# Patient Record
Sex: Male | Born: 1940
Health system: Southern US, Community
[De-identification: ages and names within clinical notes are randomized; demographics above are authoritative.]

## PROBLEM LIST (undated history)

## (undated) DIAGNOSIS — R233 Spontaneous ecchymoses: Secondary | ICD-10-CM

## (undated) DIAGNOSIS — J189 Pneumonia, unspecified organism: Secondary | ICD-10-CM

## (undated) DIAGNOSIS — I700 Atherosclerosis of aorta: Secondary | ICD-10-CM

## (undated) DIAGNOSIS — E782 Mixed hyperlipidemia: Secondary | ICD-10-CM

## (undated) DIAGNOSIS — K56609 Unspecified intestinal obstruction, unspecified as to partial versus complete obstruction: Secondary | ICD-10-CM

## (undated) DIAGNOSIS — K219 Gastro-esophageal reflux disease without esophagitis: Secondary | ICD-10-CM

## (undated) DIAGNOSIS — M1990 Unspecified osteoarthritis, unspecified site: Secondary | ICD-10-CM

## (undated) DIAGNOSIS — F5101 Primary insomnia: Secondary | ICD-10-CM

## (undated) DIAGNOSIS — R0902 Hypoxemia: Secondary | ICD-10-CM

## (undated) DIAGNOSIS — J449 Chronic obstructive pulmonary disease, unspecified: Secondary | ICD-10-CM

## (undated) DIAGNOSIS — N400 Enlarged prostate without lower urinary tract symptoms: Secondary | ICD-10-CM

## (undated) DIAGNOSIS — G4730 Sleep apnea, unspecified: Secondary | ICD-10-CM

## (undated) DIAGNOSIS — I10 Essential (primary) hypertension: Secondary | ICD-10-CM

## (undated) HISTORY — DX: Primary insomnia: F51.01

## (undated) HISTORY — DX: Unspecified intestinal obstruction, unspecified as to partial versus complete obstruction: K56.609

## (undated) HISTORY — DX: Sleep apnea, unspecified: G47.30

## (undated) HISTORY — DX: Gastro-esophageal reflux disease without esophagitis: K21.9

## (undated) HISTORY — PX: ABDOMINAL AORTIC ANEURYSM REPAIR: SUR1152

## (undated) HISTORY — DX: Spontaneous ecchymoses: R23.3

## (undated) HISTORY — PX: PROSTATECTOMY: SHX69

## (undated) HISTORY — DX: Chronic obstructive pulmonary disease, unspecified: J44.9

## (undated) HISTORY — DX: Atherosclerosis of aorta: I70.0

## (undated) HISTORY — DX: Unspecified osteoarthritis, unspecified site: M19.90

## (undated) HISTORY — DX: Benign prostatic hyperplasia without lower urinary tract symptoms: N40.0

## (undated) HISTORY — DX: Hypoxemia: R09.02

## (undated) HISTORY — PX: BACK SURGERY: SHX140

## (undated) HISTORY — DX: Essential (primary) hypertension: I10

## (undated) HISTORY — DX: Pneumonia, unspecified organism: J18.9

## (undated) HISTORY — DX: Mixed hyperlipidemia: E78.2

---

## 1982-12-23 HISTORY — PX: BACK SURGERY: SHX140

## 2003-03-11 ENCOUNTER — Encounter: Payer: Self-pay | Admitting: Neurosurgery

## 2003-03-15 ENCOUNTER — Inpatient Hospital Stay (HOSPITAL_COMMUNITY): Admission: RE | Admit: 2003-03-15 | Discharge: 2003-03-17 | Payer: Self-pay | Admitting: Neurosurgery

## 2003-03-15 ENCOUNTER — Encounter: Payer: Self-pay | Admitting: Neurosurgery

## 2012-06-14 DIAGNOSIS — N201 Calculus of ureter: Secondary | ICD-10-CM | POA: Diagnosis not present

## 2012-06-14 DIAGNOSIS — R109 Unspecified abdominal pain: Secondary | ICD-10-CM | POA: Diagnosis not present

## 2012-06-14 DIAGNOSIS — R112 Nausea with vomiting, unspecified: Secondary | ICD-10-CM | POA: Diagnosis not present

## 2013-12-23 HISTORY — PX: ROTATOR CUFF REPAIR: SHX139

## 2014-01-03 DIAGNOSIS — R6889 Other general symptoms and signs: Secondary | ICD-10-CM | POA: Diagnosis not present

## 2014-01-03 DIAGNOSIS — T8131XA Disruption of external operation (surgical) wound, not elsewhere classified, initial encounter: Secondary | ICD-10-CM | POA: Diagnosis not present

## 2015-02-03 DIAGNOSIS — J449 Chronic obstructive pulmonary disease, unspecified: Secondary | ICD-10-CM | POA: Diagnosis not present

## 2015-02-03 DIAGNOSIS — C4492 Squamous cell carcinoma of skin, unspecified: Secondary | ICD-10-CM | POA: Diagnosis not present

## 2015-02-03 DIAGNOSIS — G5792 Unspecified mononeuropathy of left lower limb: Secondary | ICD-10-CM | POA: Diagnosis not present

## 2015-02-11 DIAGNOSIS — C4402 Squamous cell carcinoma of skin of lip: Secondary | ICD-10-CM | POA: Diagnosis not present

## 2015-02-11 DIAGNOSIS — L570 Actinic keratosis: Secondary | ICD-10-CM | POA: Diagnosis not present

## 2015-02-20 DIAGNOSIS — J449 Chronic obstructive pulmonary disease, unspecified: Secondary | ICD-10-CM | POA: Diagnosis not present

## 2015-02-20 DIAGNOSIS — C4492 Squamous cell carcinoma of skin, unspecified: Secondary | ICD-10-CM | POA: Diagnosis not present

## 2015-02-20 DIAGNOSIS — G5792 Unspecified mononeuropathy of left lower limb: Secondary | ICD-10-CM | POA: Diagnosis not present

## 2015-02-25 DIAGNOSIS — J209 Acute bronchitis, unspecified: Secondary | ICD-10-CM | POA: Diagnosis not present

## 2015-02-25 DIAGNOSIS — J449 Chronic obstructive pulmonary disease, unspecified: Secondary | ICD-10-CM | POA: Diagnosis not present

## 2015-02-28 DIAGNOSIS — R0602 Shortness of breath: Secondary | ICD-10-CM | POA: Diagnosis not present

## 2015-02-28 DIAGNOSIS — R042 Hemoptysis: Secondary | ICD-10-CM | POA: Diagnosis not present

## 2015-02-28 DIAGNOSIS — R079 Chest pain, unspecified: Secondary | ICD-10-CM | POA: Diagnosis not present

## 2015-02-28 DIAGNOSIS — R05 Cough: Secondary | ICD-10-CM | POA: Diagnosis not present

## 2015-02-28 DIAGNOSIS — R9431 Abnormal electrocardiogram [ECG] [EKG]: Secondary | ICD-10-CM | POA: Diagnosis not present

## 2015-03-01 DIAGNOSIS — J441 Chronic obstructive pulmonary disease with (acute) exacerbation: Secondary | ICD-10-CM | POA: Diagnosis not present

## 2015-03-01 DIAGNOSIS — K219 Gastro-esophageal reflux disease without esophagitis: Secondary | ICD-10-CM | POA: Diagnosis not present

## 2015-03-01 DIAGNOSIS — F17211 Nicotine dependence, cigarettes, in remission: Secondary | ICD-10-CM | POA: Diagnosis not present

## 2015-03-15 ENCOUNTER — Ambulatory Visit (INDEPENDENT_AMBULATORY_CARE_PROVIDER_SITE_OTHER): Payer: BLUE CROSS/BLUE SHIELD | Admitting: Internal Medicine

## 2015-03-15 ENCOUNTER — Encounter: Payer: Self-pay | Admitting: Internal Medicine

## 2015-03-15 VITALS — BP 132/80 | HR 70 | Ht 69.0 in | Wt 210.6 lb

## 2015-03-15 DIAGNOSIS — I10 Essential (primary) hypertension: Secondary | ICD-10-CM

## 2015-03-15 DIAGNOSIS — J449 Chronic obstructive pulmonary disease, unspecified: Secondary | ICD-10-CM | POA: Diagnosis not present

## 2015-03-15 DIAGNOSIS — J9611 Chronic respiratory failure with hypoxia: Secondary | ICD-10-CM | POA: Insufficient documentation

## 2015-03-15 MED ORDER — VALSARTAN 80 MG PO TABS: 80.0000 mg | ORAL_TABLET | Freq: Every day | ORAL | Status: DC

## 2015-03-15 MED ORDER — OMEPRAZOLE 20 MG PO CPDR: 20.0000 mg | DELAYED_RELEASE_CAPSULE | Freq: Two times a day (BID) | ORAL | Status: DC

## 2015-03-15 NOTE — Patient Instructions (Addendum)
Stop lisinopril and start valsartan 80 mg daily in its place  Increase omeprazole to 20 mg Take 30- 60 min before your first and last meals of the day   GERD (REFLUX)  is an extremely common cause of respiratory symptoms just like yours , many times with no obvious heartburn at all.    It can be treated with medication, but also with lifestyle changes including avoidance of late meals, excessive alcohol, smoking cessation, and avoid fatty foods, chocolate, peppermint, colas, red wine, and acidic juices such as orange juice.  NO MINT OR MENTHOL PRODUCTS SO NO COUGH DROPS  USE SUGARLESS CANDY INSTEAD (Jolley ranchers or Stover's or Life Savers) or even ice chips will also do - the key is to swallow to prevent all throat clearing. NO OIL BASED VITAMINS - use powdered substitutes.  Only use your albuterol as a rescue medication to be used if you can't catch your breath by resting or doing a relaxed purse lip breathing pattern.  - The less you use it, the better it will work when you need it. - Ok to use up to 2 puffs  every 4 hours if you must but call for immediate appointment if use goes up over your usual need - Don't leave home without it !!  (think of it like the spare tire for your car)     Please schedule a follow up office visit in 6 weeks, call sooner if needed with pfts on return

## 2015-03-15 NOTE — Progress Notes (Signed)
Subjective:     Patient ID: William Clay, male   DOB: 1941/07/05,    MRN: [id number]  HPI  60 yowm quit smoking 2009  Dx copd 2010 referrred 03/15/2015 by Dr Dirk Dress for sob   03/15/2015 1st [facility] office visit/ William Clay   Chief Complaint  Patient presents with  . Pulmonary Consult    Referred by Dr. [name] for eval of COPD. Pt states dxed with COPD in 2010. He c/o cough, SOB and hoarsenss. He states that his SOB "depends on the weather".    indolent onset of progressive doe after quit smoking and gradually has needed more and more duoneb with less and less benefit. Cough is dry and day > noct. Hot or cold weather makes it worse.  Presently doe x 50 ft/ assoc with maybe 25 lb wt gain since quit smoking   No better on breo, symbiocort or spiriva and couldn't afford them anyway    No obvious day to day or daytime variabilty or assoc  cp or chest tightness, subjective wheeze overt sinus or hb symptoms. No unusual exp hx or h/o childhood pna/ asthma or knowledge of premature birth.  Sleeping ok without nocturnal  or early am exacerbation  of respiratory  c/o's or need for noct saba. Also denies any obvious fluctuation of symptoms with weather or environmental changes or other aggravating or alleviating factors except as outlined above   Current Medications, Allergies, Complete Past Medical History, Past Surgical History, Family History, and Social History were reviewed in [facility] record.  ROS  The following are not active complaints unless bolded sore throat, dysphagia, dental problems, itching, sneezing,  nasal congestion or excess/ purulent secretions, ear ache,   fever, chills, sweats, unintended wt loss, pleuritic or exertional cp, hemoptysis,  orthopnea pnd or leg swelling, presyncope, palpitations, heartburn, abdominal pain, anorexia, nausea, vomiting, diarrhea  or change in bowel or urinary habits, change in stools or urine, dysuria,hematuria,   rash, arthralgias, visual complaints, headache, numbness weakness or ataxia or problems with walking or coordination,  change in mood/affect or memory.         Review of Systems     Objective:   Physical Exam   Extremely hoarse amb wm nad  Wt Readings from Last 3 Encounters:  03/15/15 210 lb 9.6 oz (95.528 kg)    Vital signs reviewed  HEENT: nl dentition, turbinates, and orophanx. Nl external ear canals without cough reflex   NECK :  without JVD/Nodes/TM/ nl carotid upstrokes bilaterally   LUNGS: no acc muscle use, clear to A and P bilaterally without cough on insp or exp maneuvers   CV:  RRR  no s3 or murmur or increase in P2, no edema   ABD:  soft and nontender with end insp hoover's. No bruits or organomegaly, bowel sounds nl  MS:  warm without deformities, calf tenderness, cyanosis or clubbing  SKIN: warm and dry without lesions    NEURO:  alert, approp, no deficits   No xrays on file in [facility] but has had CT at [facility]     Assessment:

## 2015-03-17 ENCOUNTER — Encounter: Payer: Self-pay | Admitting: Internal Medicine

## 2015-03-17 DIAGNOSIS — I10 Essential (primary) hypertension: Secondary | ICD-10-CM | POA: Insufficient documentation

## 2015-03-17 NOTE — Assessment & Plan Note (Signed)
>>  ASSESSMENT AND PLAN FOR CHRONIC OBSTRUCTIVE PULMONARY DISEASE (HCC) WRITTEN ON 03/17/2015  4:30 PM BY WERT, MICHAEL B, MD   When respiratory symptoms begin or become refractory well after a patient reports complete smoking cessation,  Especially when this wasn't the case while they were smoking, a red flag is raised based on the work of Dr Primitivo Gauze which states:  if you quit smoking when your best day FEV1 is still well preserved it is highly unlikely you will progress to severe disease.  That is to say, once the smoking stops,  the symptoms should not suddenly erupt or markedly worsen.  If so, the differential diagnosis should include  obesity/deconditioning,  LPR/Reflux/Aspiration syndromes,  occult CHF, or  especially side effect of medications commonly used in this population, especially ACEi  He has gained about 25 lb and could have LPR but I'm most concerned based on exam re ACEi (see hbp)  rec max gerd rx and off acei then return for full pfts in 6 weeks  See instructions for specific recommendations which were reviewed directly with the patient who was given a copy with highlighter outlining the key components.

## 2015-03-17 NOTE — Assessment & Plan Note (Signed)
  When respiratory symptoms begin or become refractory well after a patient reports complete smoking cessation,  Especially when this wasn't the case while they were smoking, a red flag is raised based on the work of Dr Kris Mouton which states:  if you quit smoking when your best day FEV1 is still well preserved it is highly unlikely you will progress to severe disease.  That is to say, once the smoking stops,  the symptoms should not suddenly erupt or markedly worsen.  If so, the differential diagnosis should include  obesity/deconditioning,  LPR/Reflux/Aspiration syndromes,  occult CHF, or  especially side effect of medications commonly used in this population, especially ACEi  He has gained about 25 lb and could have LPR but I'm most concerned based on exam re ACEi (see hbp)  rec max gerd rx and off acei then return for full pfts in 6 weeks  See instructions for specific recommendations which were reviewed directly with the patient who was given a copy with highlighter outlining the key components.

## 2015-03-17 NOTE — Assessment & Plan Note (Addendum)
ACE inhibitors are problematic in  pts with airway complaints because  even experienced pulmonologists can't always distinguish ace effects from copd/asthma.  By themselves they don't actually cause a problem, much like oxygen can't by itself start a fire, but they certainly serve as a powerful catalyst or enhancer for any "fire"  or inflammatory process in the upper airway, be it caused by an ET  tube or more commonly reflux (especially in the obese or pts with known GERD or who are on biphoshonates).    In the era of ARB near equivalency until we have a better handle on the reversibility of the airway problem will rec trial off acei > it's the only way to tell whether it's contributing to any of his symptoms  Try valsartan 80 mg daily

## 2015-03-20 DIAGNOSIS — R1013 Epigastric pain: Secondary | ICD-10-CM | POA: Diagnosis not present

## 2015-03-20 DIAGNOSIS — R1310 Dysphagia, unspecified: Secondary | ICD-10-CM | POA: Diagnosis not present

## 2015-03-20 DIAGNOSIS — K219 Gastro-esophageal reflux disease without esophagitis: Secondary | ICD-10-CM | POA: Diagnosis not present

## 2015-03-24 DIAGNOSIS — I10 Essential (primary) hypertension: Secondary | ICD-10-CM | POA: Diagnosis not present

## 2015-03-24 DIAGNOSIS — R1310 Dysphagia, unspecified: Secondary | ICD-10-CM | POA: Diagnosis not present

## 2015-03-24 DIAGNOSIS — K2270 Barrett's esophagus without dysplasia: Secondary | ICD-10-CM | POA: Diagnosis not present

## 2015-03-24 DIAGNOSIS — K449 Diaphragmatic hernia without obstruction or gangrene: Secondary | ICD-10-CM | POA: Diagnosis not present

## 2015-03-24 DIAGNOSIS — Z8719 Personal history of other diseases of the digestive system: Secondary | ICD-10-CM | POA: Diagnosis not present

## 2015-03-24 DIAGNOSIS — K317 Polyp of stomach and duodenum: Secondary | ICD-10-CM | POA: Diagnosis not present

## 2015-03-24 DIAGNOSIS — K219 Gastro-esophageal reflux disease without esophagitis: Secondary | ICD-10-CM | POA: Diagnosis not present

## 2015-03-24 DIAGNOSIS — K209 Esophagitis, unspecified: Secondary | ICD-10-CM | POA: Diagnosis not present

## 2015-04-19 DIAGNOSIS — I10 Essential (primary) hypertension: Secondary | ICD-10-CM | POA: Diagnosis not present

## 2015-04-19 DIAGNOSIS — E780 Pure hypercholesterolemia: Secondary | ICD-10-CM | POA: Diagnosis not present

## 2015-04-20 DIAGNOSIS — I10 Essential (primary) hypertension: Secondary | ICD-10-CM | POA: Diagnosis not present

## 2015-04-20 DIAGNOSIS — G5792 Unspecified mononeuropathy of left lower limb: Secondary | ICD-10-CM | POA: Diagnosis not present

## 2015-04-20 DIAGNOSIS — G4733 Obstructive sleep apnea (adult) (pediatric): Secondary | ICD-10-CM | POA: Diagnosis not present

## 2015-04-20 DIAGNOSIS — J449 Chronic obstructive pulmonary disease, unspecified: Secondary | ICD-10-CM | POA: Diagnosis not present

## 2015-04-20 DIAGNOSIS — E780 Pure hypercholesterolemia: Secondary | ICD-10-CM | POA: Diagnosis not present

## 2015-04-26 ENCOUNTER — Ambulatory Visit (INDEPENDENT_AMBULATORY_CARE_PROVIDER_SITE_OTHER): Payer: Medicare Other | Admitting: Internal Medicine

## 2015-04-26 ENCOUNTER — Encounter: Payer: Self-pay | Admitting: Internal Medicine

## 2015-04-26 VITALS — BP 124/80 | HR 83 | Ht 69.0 in | Wt 215.0 lb

## 2015-04-26 DIAGNOSIS — I10 Essential (primary) hypertension: Secondary | ICD-10-CM

## 2015-04-26 DIAGNOSIS — J449 Chronic obstructive pulmonary disease, unspecified: Secondary | ICD-10-CM | POA: Diagnosis not present

## 2015-04-26 LAB — PULMONARY FUNCTION TEST
DL/VA % PRED: 47 %
DL/VA: 2.14 ml/min/mmHg/L
DLCO UNC % PRED: 44 %
DLCO UNC: 13.68 ml/min/mmHg
FEF 25-75 PRE: 1.23 L/s
FEF 25-75 Post: 2.42 L/sec
FEF2575-%CHANGE-POST: 97 %
FEF2575-%PRED-POST: 105 %
FEF2575-%Pred-Pre: 53 %
FEV1-%CHANGE-POST: 14 %
FEV1-%PRED-POST: 86 %
FEV1-%PRED-PRE: 75 %
FEV1-POST: 2.65 L
FEV1-Pre: 2.32 L
FEV1FVC-%Change-Post: 1 %
FEV1FVC-%PRED-PRE: 93 %
FEV6-%Change-Post: 14 %
FEV6-%Pred-Post: 96 %
FEV6-%Pred-Pre: 84 %
FEV6-POST: 3.81 L
FEV6-Pre: 3.34 L
FEV6FVC-%Change-Post: 1 %
FEV6FVC-%PRED-PRE: 104 %
FEV6FVC-%Pred-Post: 105 %
FVC-%Change-Post: 12 %
FVC-%Pred-Post: 91 %
FVC-%Pred-Pre: 81 %
FVC-Post: 3.83 L
FVC-Pre: 3.41 L
POST FEV1/FVC RATIO: 69 %
PRE FEV6/FVC RATIO: 98 %
Post FEV6/FVC ratio: 99 %
Pre FEV1/FVC ratio: 68 %
RV % pred: 124 %
RV: 3.03 L
TLC % PRED: 104 %
TLC: 7.1 L

## 2015-04-26 NOTE — Assessment & Plan Note (Signed)
-   trial off acei  03/15/2015 >  - PFTs  FEV1 2.65 ( 86%) ratio 69 p 15 % better from saba , dlco 44 corrcts to 93 and erv 56    I had an extended discussion with the patient reviewing all relevant studies completed to date and  lasting 15 to 20 minutes of a 25 minute visit on the following ongoing concerns:  As I explained to this patient in detail:  although there may be copd present, it is very mild and may not be clinically relevant:   it does not appear to be limiting activity tolerance any more than a set of worn tires limits someone from driving a car  around a parking lot.  A new set of Michelins might look good but would have no perceived impact on the performance of the car and would not be worth the cost.  That is to say:    I don't recommend aggressive pulmonary rx at this point unless limiting symptoms arise or acute exacerbations become as issue, neither of which is the case now.  I asked the patient to contact this office at any time in the future should either of these problems arise.  Ok to use saba in this setting but if needs more than bid needs to return to consider laba or lama or laba/lama though not needed at present

## 2015-04-26 NOTE — Patient Instructions (Signed)
Ok to use albuterol up to twice daily but if needing more please return   You have the mildest form of copd there is = GOLD I and there is no need for any form of stronger medications at this point    If you are satisfied with your treatment plan,  let your doctor know and he/she can either refill your medications or you can return here when your prescription runs out.     If in any way you are not 100% satisfied,  please tell us.  If 100% better, tell your friends!  Pulmonary follow up is as needed

## 2015-04-26 NOTE — Progress Notes (Signed)
PFT performed today. 

## 2015-04-26 NOTE — Assessment & Plan Note (Signed)
>>  ASSESSMENT AND PLAN FOR CHRONIC OBSTRUCTIVE PULMONARY DISEASE (HCC) WRITTEN ON 04/26/2015  5:00 PM BY WERT, MICHAEL B, MD  - trial off acei  03/15/2015 >  - PFTs  FEV1 2.65 ( 86%) ratio 69 p 15 % better from saba , dlco 44 corrcts to 93 and erv 56    I had an extended discussion with the patient reviewing all relevant studies completed to date and  lasting 15 to 20 minutes of a 25 minute visit on the following ongoing concerns:  As I explained to this patient in detail:  although there may be copd present, it is very mild and may not be clinically relevant:   it does not appear to be limiting activity tolerance any more than a set of worn tires limits someone from driving a car  around a parking lot.  A new set of Michelins might look good but would have no perceived impact on the performance of the car and would not be worth the cost.  That is to say:    I don't recommend aggressive pulmonary rx at this point unless limiting symptoms arise or acute exacerbations become as issue, neither of which is the case now.  I asked the patient to contact this office at any time in the future should either of these problems arise.  Ok to use saba in this setting but if needs more than bid needs to return to consider laba or lama or laba/lama though not needed at present

## 2015-04-26 NOTE — Progress Notes (Signed)
Subjective:    Patient ID: William Clay, male   DOB: Oct 25, 1941     MRN: [id number]    Brief patient profile:  45 yowm quit smoking 2009  Dx copd 2010 referrred 03/15/2015 by Dr Dirk Dress for sob with GOLD I criteria 04/26/2015    History of Present Illness  03/15/2015 1st [facility] office visit/ [name]   Chief Complaint  Patient presents with  . Pulmonary Consult    Referred by Dr. [name] for eval of COPD. Pt states dxed with COPD in 2010. He c/o cough, SOB and hoarsenss. He states that his SOB "depends on the weather".    indolent onset of progressive doe after quit smoking and gradually has needed more and more duoneb with less and less benefit. Cough is dry and day > noct. Hot or cold weather makes it worse.  Presently doe x 50 ft/ assoc with maybe 25 lb wt gain since quit smoking  No better on breo, symbiocort or spiriva and couldn't afford them anyway  rec Stop lisinopril and start valsartan 80 mg daily in its place Increase omeprazole to 20 mg Take 30- 60 min before your first and last meals of the day  GERD  Diet  Only use your albuterol as rescue   04/26/2015 f/u ov/Clova Morlock re: GOLD I  COPD  Chief Complaint  Patient presents with  . Follow-up    PFT done today. Cough, hoarseness and SOB are unchanged since the last visit.     Not limited by breathing from desired activities   Wakes up each am and uses neb saba  to get his motor running but then nothing needed p that all day   No obvious day to day or daytime variabilty or assoc  cp or chest tightness, subjective wheeze overt sinus or hb symptoms. No unusual exp hx or h/o childhood pna/ asthma or knowledge of premature birth.  Sleeping ok without nocturnal  or early am exacerbation  of respiratory  c/o's or need for noct saba. Also denies any obvious fluctuation of symptoms with weather or environmental changes or other aggravating or alleviating factors except as outlined above   Current Medications, Allergies,  Complete Past Medical History, Past Surgical History, Family History, and Social History were reviewed in [employer] record.  ROS  The following are not active complaints unless bolded sore throat, dysphagia, dental problems, itching, sneezing,  nasal congestion or excess/ purulent secretions, ear ache,   fever, chills, sweats, unintended wt loss, pleuritic or exertional cp, hemoptysis,  orthopnea pnd or leg swelling, presyncope, palpitations, heartburn, abdominal pain, anorexia, nausea, vomiting, diarrhea  or change in bowel or urinary habits, change in stools or urine, dysuria,hematuria,  rash, arthralgias, visual complaints, headache, numbness weakness or ataxia or problems with walking or coordination,  change in mood/affect or memory.               Objective:   Physical Exam   Moderately  hoarse amb wm nad (much improved)     Wt Readings from Last 3 Encounters:  04/26/15 215 lb (97.523 kg)  03/15/15 210 lb 9.6 oz (95.528 kg)    Vital signs reviewed   HEENT: nl dentition, turbinates, and orophanx. Nl external ear canals without cough reflex   NECK :  without JVD/Nodes/TM/ nl carotid upstrokes bilaterally   LUNGS: no acc muscle use, clear to A and P bilaterally without cough on insp or exp maneuvers   CV:  RRR  no s3  or murmur or increase in P2, no edema   ABD:  soft and nontender with end insp hoover's. No bruits or organomegaly, bowel sounds nl  MS:  warm without deformities, calf tenderness, cyanosis or clubbing  SKIN: warm and dry without lesions    NEURO:  alert, approp, no deficits   No xrays on file in [facility] but has had CT at [facility]     Assessment:

## 2015-04-26 NOTE — Assessment & Plan Note (Signed)
Try off acei 03/15/2015 due to pseudowheeze > resolved  Adequate control on present rx, reviewed > no change in rx needed  > would avoid acei here.   All f/u per primary care

## 2015-05-17 DIAGNOSIS — R1310 Dysphagia, unspecified: Secondary | ICD-10-CM | POA: Diagnosis not present

## 2015-05-17 DIAGNOSIS — K219 Gastro-esophageal reflux disease without esophagitis: Secondary | ICD-10-CM | POA: Diagnosis not present

## 2015-05-23 DIAGNOSIS — K219 Gastro-esophageal reflux disease without esophagitis: Secondary | ICD-10-CM | POA: Diagnosis not present

## 2015-05-23 DIAGNOSIS — R1310 Dysphagia, unspecified: Secondary | ICD-10-CM | POA: Diagnosis not present

## 2015-05-30 DIAGNOSIS — R1310 Dysphagia, unspecified: Secondary | ICD-10-CM | POA: Diagnosis not present

## 2015-06-25 DIAGNOSIS — K8020 Calculus of gallbladder without cholecystitis without obstruction: Secondary | ICD-10-CM | POA: Diagnosis not present

## 2015-06-25 DIAGNOSIS — R109 Unspecified abdominal pain: Secondary | ICD-10-CM | POA: Diagnosis not present

## 2015-07-04 DIAGNOSIS — L853 Xerosis cutis: Secondary | ICD-10-CM | POA: Diagnosis not present

## 2015-07-04 DIAGNOSIS — R233 Spontaneous ecchymoses: Secondary | ICD-10-CM | POA: Diagnosis not present

## 2015-07-26 DIAGNOSIS — N400 Enlarged prostate without lower urinary tract symptoms: Secondary | ICD-10-CM | POA: Diagnosis not present

## 2015-07-26 DIAGNOSIS — R7301 Impaired fasting glucose: Secondary | ICD-10-CM | POA: Diagnosis not present

## 2015-07-26 DIAGNOSIS — E780 Pure hypercholesterolemia: Secondary | ICD-10-CM | POA: Diagnosis not present

## 2015-07-26 DIAGNOSIS — I10 Essential (primary) hypertension: Secondary | ICD-10-CM | POA: Diagnosis not present

## 2015-07-27 DIAGNOSIS — R7309 Other abnormal glucose: Secondary | ICD-10-CM | POA: Diagnosis not present

## 2015-07-27 DIAGNOSIS — I10 Essential (primary) hypertension: Secondary | ICD-10-CM | POA: Diagnosis not present

## 2015-07-27 DIAGNOSIS — G5792 Unspecified mononeuropathy of left lower limb: Secondary | ICD-10-CM | POA: Diagnosis not present

## 2015-07-27 DIAGNOSIS — G4733 Obstructive sleep apnea (adult) (pediatric): Secondary | ICD-10-CM | POA: Diagnosis not present

## 2015-07-27 DIAGNOSIS — N400 Enlarged prostate without lower urinary tract symptoms: Secondary | ICD-10-CM | POA: Diagnosis not present

## 2015-07-27 DIAGNOSIS — M6530 Trigger finger, unspecified finger: Secondary | ICD-10-CM | POA: Diagnosis not present

## 2015-07-27 DIAGNOSIS — E780 Pure hypercholesterolemia: Secondary | ICD-10-CM | POA: Diagnosis not present

## 2015-07-27 DIAGNOSIS — J449 Chronic obstructive pulmonary disease, unspecified: Secondary | ICD-10-CM | POA: Diagnosis not present

## 2015-08-30 DIAGNOSIS — K760 Fatty (change of) liver, not elsewhere classified: Secondary | ICD-10-CM | POA: Diagnosis not present

## 2015-08-30 DIAGNOSIS — J439 Emphysema, unspecified: Secondary | ICD-10-CM | POA: Diagnosis not present

## 2015-08-30 DIAGNOSIS — J9601 Acute respiratory failure with hypoxia: Secondary | ICD-10-CM | POA: Diagnosis not present

## 2015-08-30 DIAGNOSIS — R509 Fever, unspecified: Secondary | ICD-10-CM | POA: Diagnosis not present

## 2015-08-30 DIAGNOSIS — R0602 Shortness of breath: Secondary | ICD-10-CM | POA: Diagnosis not present

## 2015-08-30 DIAGNOSIS — F441 Dissociative fugue: Secondary | ICD-10-CM | POA: Diagnosis present

## 2015-08-30 DIAGNOSIS — Z7982 Long term (current) use of aspirin: Secondary | ICD-10-CM | POA: Diagnosis not present

## 2015-08-30 DIAGNOSIS — Z888 Allergy status to other drugs, medicaments and biological substances status: Secondary | ICD-10-CM | POA: Diagnosis not present

## 2015-08-30 DIAGNOSIS — Z880 Allergy status to penicillin: Secondary | ICD-10-CM | POA: Diagnosis not present

## 2015-08-30 DIAGNOSIS — R0989 Other specified symptoms and signs involving the circulatory and respiratory systems: Secondary | ICD-10-CM | POA: Diagnosis not present

## 2015-08-30 DIAGNOSIS — B9620 Unspecified Escherichia coli [E. coli] as the cause of diseases classified elsewhere: Secondary | ICD-10-CM | POA: Diagnosis present

## 2015-08-30 DIAGNOSIS — K219 Gastro-esophageal reflux disease without esophagitis: Secondary | ICD-10-CM | POA: Diagnosis not present

## 2015-08-30 DIAGNOSIS — J441 Chronic obstructive pulmonary disease with (acute) exacerbation: Secondary | ICD-10-CM | POA: Diagnosis not present

## 2015-08-30 DIAGNOSIS — Z79899 Other long term (current) drug therapy: Secondary | ICD-10-CM | POA: Diagnosis not present

## 2015-08-30 DIAGNOSIS — I10 Essential (primary) hypertension: Secondary | ICD-10-CM | POA: Diagnosis not present

## 2015-08-30 DIAGNOSIS — J159 Unspecified bacterial pneumonia: Secondary | ICD-10-CM | POA: Diagnosis not present

## 2015-08-30 DIAGNOSIS — Z8701 Personal history of pneumonia (recurrent): Secondary | ICD-10-CM | POA: Diagnosis not present

## 2015-08-30 DIAGNOSIS — Z23 Encounter for immunization: Secondary | ICD-10-CM | POA: Diagnosis not present

## 2015-08-30 DIAGNOSIS — A419 Sepsis, unspecified organism: Secondary | ICD-10-CM | POA: Diagnosis not present

## 2015-08-30 DIAGNOSIS — Z85820 Personal history of malignant melanoma of skin: Secondary | ICD-10-CM | POA: Diagnosis not present

## 2015-08-30 DIAGNOSIS — G92 Toxic encephalopathy: Secondary | ICD-10-CM | POA: Diagnosis not present

## 2015-08-30 DIAGNOSIS — E780 Pure hypercholesterolemia: Secondary | ICD-10-CM | POA: Diagnosis not present

## 2015-08-30 DIAGNOSIS — Z87891 Personal history of nicotine dependence: Secondary | ICD-10-CM | POA: Diagnosis not present

## 2015-08-30 DIAGNOSIS — R319 Hematuria, unspecified: Secondary | ICD-10-CM | POA: Diagnosis not present

## 2015-08-30 DIAGNOSIS — N3001 Acute cystitis with hematuria: Secondary | ICD-10-CM | POA: Diagnosis not present

## 2015-08-30 DIAGNOSIS — M1990 Unspecified osteoarthritis, unspecified site: Secondary | ICD-10-CM | POA: Diagnosis not present

## 2015-08-30 DIAGNOSIS — R410 Disorientation, unspecified: Secondary | ICD-10-CM | POA: Diagnosis not present

## 2015-08-30 DIAGNOSIS — R6520 Severe sepsis without septic shock: Secondary | ICD-10-CM | POA: Diagnosis not present

## 2015-09-07 DIAGNOSIS — J449 Chronic obstructive pulmonary disease, unspecified: Secondary | ICD-10-CM | POA: Diagnosis not present

## 2015-09-07 DIAGNOSIS — N390 Urinary tract infection, site not specified: Secondary | ICD-10-CM | POA: Diagnosis not present

## 2015-09-07 DIAGNOSIS — A419 Sepsis, unspecified organism: Secondary | ICD-10-CM | POA: Diagnosis not present

## 2015-09-07 DIAGNOSIS — D7289 Other specified disorders of white blood cells: Secondary | ICD-10-CM | POA: Diagnosis not present

## 2015-09-07 DIAGNOSIS — J9601 Acute respiratory failure with hypoxia: Secondary | ICD-10-CM | POA: Diagnosis not present

## 2015-09-07 DIAGNOSIS — D72829 Elevated white blood cell count, unspecified: Secondary | ICD-10-CM | POA: Diagnosis not present

## 2015-09-07 DIAGNOSIS — G92 Toxic encephalopathy: Secondary | ICD-10-CM | POA: Diagnosis not present

## 2015-09-07 DIAGNOSIS — I10 Essential (primary) hypertension: Secondary | ICD-10-CM | POA: Diagnosis not present

## 2015-09-21 DIAGNOSIS — M791 Myalgia: Secondary | ICD-10-CM | POA: Diagnosis not present

## 2015-09-21 DIAGNOSIS — D72829 Elevated white blood cell count, unspecified: Secondary | ICD-10-CM | POA: Diagnosis not present

## 2015-10-09 DIAGNOSIS — Z6832 Body mass index (BMI) 32.0-32.9, adult: Secondary | ICD-10-CM | POA: Diagnosis not present

## 2015-10-09 DIAGNOSIS — Z0000 Encounter for general adult medical examination without abnormal findings: Secondary | ICD-10-CM | POA: Diagnosis not present

## 2015-10-09 DIAGNOSIS — E6609 Other obesity due to excess calories: Secondary | ICD-10-CM | POA: Diagnosis not present

## 2015-10-09 DIAGNOSIS — Z1211 Encounter for screening for malignant neoplasm of colon: Secondary | ICD-10-CM | POA: Diagnosis not present

## 2015-10-12 DIAGNOSIS — Z1211 Encounter for screening for malignant neoplasm of colon: Secondary | ICD-10-CM | POA: Diagnosis not present

## 2015-10-16 DIAGNOSIS — F17211 Nicotine dependence, cigarettes, in remission: Secondary | ICD-10-CM | POA: Diagnosis not present

## 2015-10-16 DIAGNOSIS — J441 Chronic obstructive pulmonary disease with (acute) exacerbation: Secondary | ICD-10-CM | POA: Diagnosis not present

## 2015-10-19 DIAGNOSIS — M7550 Bursitis of unspecified shoulder: Secondary | ICD-10-CM | POA: Diagnosis not present

## 2015-10-19 DIAGNOSIS — M25512 Pain in left shoulder: Secondary | ICD-10-CM | POA: Diagnosis not present

## 2015-10-19 DIAGNOSIS — S4992XS Unspecified injury of left shoulder and upper arm, sequela: Secondary | ICD-10-CM | POA: Diagnosis not present

## 2015-10-24 DIAGNOSIS — M25512 Pain in left shoulder: Secondary | ICD-10-CM | POA: Diagnosis not present

## 2015-10-24 DIAGNOSIS — M7522 Bicipital tendinitis, left shoulder: Secondary | ICD-10-CM | POA: Diagnosis not present

## 2015-10-24 DIAGNOSIS — S46012A Strain of muscle(s) and tendon(s) of the rotator cuff of left shoulder, initial encounter: Secondary | ICD-10-CM | POA: Diagnosis not present

## 2015-10-24 DIAGNOSIS — M7580 Other shoulder lesions, unspecified shoulder: Secondary | ICD-10-CM | POA: Diagnosis not present

## 2015-10-26 DIAGNOSIS — S4992XS Unspecified injury of left shoulder and upper arm, sequela: Secondary | ICD-10-CM | POA: Diagnosis not present

## 2015-10-26 DIAGNOSIS — S46012A Strain of muscle(s) and tendon(s) of the rotator cuff of left shoulder, initial encounter: Secondary | ICD-10-CM | POA: Diagnosis not present

## 2015-10-31 DIAGNOSIS — I10 Essential (primary) hypertension: Secondary | ICD-10-CM | POA: Diagnosis not present

## 2015-10-31 DIAGNOSIS — R7301 Impaired fasting glucose: Secondary | ICD-10-CM | POA: Diagnosis not present

## 2015-10-31 DIAGNOSIS — E782 Mixed hyperlipidemia: Secondary | ICD-10-CM | POA: Diagnosis not present

## 2015-11-01 DIAGNOSIS — M47814 Spondylosis without myelopathy or radiculopathy, thoracic region: Secondary | ICD-10-CM | POA: Diagnosis not present

## 2015-11-01 DIAGNOSIS — J208 Acute bronchitis due to other specified organisms: Secondary | ICD-10-CM | POA: Diagnosis not present

## 2015-11-01 DIAGNOSIS — R7309 Other abnormal glucose: Secondary | ICD-10-CM | POA: Diagnosis not present

## 2015-11-01 DIAGNOSIS — J018 Other acute sinusitis: Secondary | ICD-10-CM | POA: Diagnosis not present

## 2015-11-01 DIAGNOSIS — I10 Essential (primary) hypertension: Secondary | ICD-10-CM | POA: Diagnosis not present

## 2015-11-01 DIAGNOSIS — D72828 Other elevated white blood cell count: Secondary | ICD-10-CM | POA: Diagnosis not present

## 2015-11-01 DIAGNOSIS — J410 Simple chronic bronchitis: Secondary | ICD-10-CM | POA: Diagnosis not present

## 2015-11-01 DIAGNOSIS — M5416 Radiculopathy, lumbar region: Secondary | ICD-10-CM | POA: Diagnosis not present

## 2015-11-01 DIAGNOSIS — M66812 Spontaneous rupture of other tendons, left shoulder: Secondary | ICD-10-CM | POA: Diagnosis not present

## 2015-11-01 DIAGNOSIS — E784 Other hyperlipidemia: Secondary | ICD-10-CM | POA: Diagnosis not present

## 2015-11-01 DIAGNOSIS — G4733 Obstructive sleep apnea (adult) (pediatric): Secondary | ICD-10-CM | POA: Diagnosis not present

## 2015-11-01 DIAGNOSIS — Z01810 Encounter for preprocedural cardiovascular examination: Secondary | ICD-10-CM | POA: Diagnosis not present

## 2015-11-01 DIAGNOSIS — Z9889 Other specified postprocedural states: Secondary | ICD-10-CM | POA: Diagnosis not present

## 2015-11-10 DIAGNOSIS — J441 Chronic obstructive pulmonary disease with (acute) exacerbation: Secondary | ICD-10-CM | POA: Diagnosis not present

## 2015-11-10 DIAGNOSIS — F17211 Nicotine dependence, cigarettes, in remission: Secondary | ICD-10-CM | POA: Diagnosis not present

## 2015-11-13 DIAGNOSIS — I10 Essential (primary) hypertension: Secondary | ICD-10-CM | POA: Diagnosis not present

## 2015-11-21 DIAGNOSIS — F17211 Nicotine dependence, cigarettes, in remission: Secondary | ICD-10-CM | POA: Diagnosis not present

## 2015-11-21 DIAGNOSIS — J441 Chronic obstructive pulmonary disease with (acute) exacerbation: Secondary | ICD-10-CM | POA: Diagnosis not present

## 2015-11-27 DIAGNOSIS — D72828 Other elevated white blood cell count: Secondary | ICD-10-CM | POA: Diagnosis not present

## 2015-11-28 DIAGNOSIS — R0602 Shortness of breath: Secondary | ICD-10-CM | POA: Diagnosis not present

## 2015-11-28 DIAGNOSIS — J441 Chronic obstructive pulmonary disease with (acute) exacerbation: Secondary | ICD-10-CM | POA: Diagnosis not present

## 2015-11-28 DIAGNOSIS — K8080 Other cholelithiasis without obstruction: Secondary | ICD-10-CM | POA: Diagnosis not present

## 2015-11-28 DIAGNOSIS — R918 Other nonspecific abnormal finding of lung field: Secondary | ICD-10-CM | POA: Diagnosis not present

## 2015-11-28 DIAGNOSIS — R0902 Hypoxemia: Secondary | ICD-10-CM | POA: Diagnosis not present

## 2015-11-28 DIAGNOSIS — F17211 Nicotine dependence, cigarettes, in remission: Secondary | ICD-10-CM | POA: Diagnosis not present

## 2015-11-28 DIAGNOSIS — R0789 Other chest pain: Secondary | ICD-10-CM | POA: Diagnosis not present

## 2015-11-30 ENCOUNTER — Ambulatory Visit (INDEPENDENT_AMBULATORY_CARE_PROVIDER_SITE_OTHER): Payer: Medicare Other | Admitting: Internal Medicine

## 2015-11-30 ENCOUNTER — Ambulatory Visit
Admission: RE | Admit: 2015-11-30 | Discharge: 2015-11-30 | Disposition: A | Discharge status: Home / Self Care | Payer: Medicare Other | Source: Ambulatory Visit | Attending: Internal Medicine | Admitting: Internal Medicine

## 2015-11-30 ENCOUNTER — Encounter: Payer: Self-pay | Admitting: Internal Medicine

## 2015-11-30 VITALS — BP 132/84 | HR 90 | Ht 69.0 in | Wt 213.4 lb

## 2015-11-30 DIAGNOSIS — R058 Other specified cough: Secondary | ICD-10-CM

## 2015-11-30 DIAGNOSIS — R05 Cough: Secondary | ICD-10-CM | POA: Diagnosis not present

## 2015-11-30 DIAGNOSIS — R0981 Nasal congestion: Secondary | ICD-10-CM | POA: Diagnosis not present

## 2015-11-30 DIAGNOSIS — J449 Chronic obstructive pulmonary disease, unspecified: Secondary | ICD-10-CM

## 2015-11-30 MED ORDER — TRAMADOL HCL 50 MG PO TABS: ORAL_TABLET | ORAL | Status: DC

## 2015-11-30 MED ORDER — METHYLPREDNISOLONE ACETATE 80 MG/ML IJ SUSP
120.0000 mg | Freq: Once | INTRAMUSCULAR | Status: AC
  Administered 2015-11-30: 120 mg via INTRAMUSCULAR

## 2015-11-30 NOTE — Progress Notes (Signed)
Quick Note:  Spoke with pt and notified of results per Dr. Wert. Pt verbalized understanding and denied any questions.  ______ 

## 2015-11-30 NOTE — Assessment & Plan Note (Signed)
The most common causes of chronic cough in immunocompetent adults include the following: upper airway cough syndrome (UACS), previously referred to as postnasal drip syndrome (PNDS), which is caused by variety of rhinosinus conditions; (2) asthma; (3) GERD; (4) chronic bronchitis from cigarette smoking or other inhaled environmental irritants; (5) nonasthmatic eosinophilic bronchitis; and (6) bronchiectasis.   These conditions, singly or in combination, have accounted for up to 94% of the causes of chronic cough in prospective studies.   Other conditions have constituted no >6% of the causes in prospective studies These have included bronchogenic carcinoma, chronic interstitial pneumonia, sarcoidosis, left ventricular failure, ACEI-induced cough, and aspiration from a condition associated with pharyngeal dysfunction.    Chronic cough is often simultaneously caused by more than one condition. A single cause has been found from 38 to 82% of the time, multiple causes from 18 to 62%. Multiply caused cough has been the result of three diseases up to 42% of the time.       Based on hx and exam, this is most likely:  Classic Upper airway cough syndrome, so named because it's frequently impossible to sort out how much is  CR/sinusitis with freq throat clearing (which can be related to primary GERD)   vs  causing  secondary (" extra esophageal")  GERD from wide swings in gastric pressure that occur with throat clearing, often  promoting self use of mint and menthol lozenges that reduce the lower esophageal sphincter tone and exacerbate the problem further in a cyclical fashion.   These are the same pts (now being labeled as having "irritable larynx syndrome" by some cough centers) who not infrequently have a history of having failed to tolerate ace inhibitors,  dry powder inhalers (both may turn out to be the case here  or biphosphonates or report having atypical reflux symptoms that don't respond to standard  doses of PPI , and are easily confused as having aecopd or asthma flares by even experienced allergists/ pulmonologists.   The first step is to maximize acid suppression and eliminate cyclical coughing then regroup if the cough persists.  I had an extended discussion with the patient reviewing all relevant studies completed to date and  lasting 25/ 40 min acute ov   1) Explained the natural history of uri and why it's necessary in patients at risk to treat GERD aggressively - at least  short term -   to reduce risk of evolving cyclical cough initially  triggered by epithelial injury and a heightened sensitivty to the effects of any upper airway irritants,  most importantly acid - related - then perpetuated by epithelial injury related to the cough itself as the upper airway collapses on itself.  That is, the more sensitive the epithelium becomes once it is damaged by the virus, the more the ensuing irritability> the more the cough, the more the secondary reflux (especially in those prone to reflux) the more the irritation of the sensitive mucosa and so on in a  Classic cyclical pattern.        3)  Each maintenance medication was reviewed in detail including most importantly the difference between maintenance and prns and under what circumstances the prns are to be triggered using an action plan format that is not reflected in the computer generated alphabetically organized AVS.    Please see instructions for details which were reviewed in writing and the patient given a copy highlighting the part that I personally wrote and discussed at today's ov.   See  instructions for specific recommendations which were reviewed directly with the patient who was given a copy with highlighter outlining the key components.

## 2015-11-30 NOTE — Assessment & Plan Note (Signed)
>>  ASSESSMENT AND PLAN FOR CHRONIC OBSTRUCTIVE PULMONARY DISEASE (HCC) WRITTEN ON 11/30/2015  6:36 PM BY Sherene Sires, MICHAEL B, MD  - trial off acei  03/15/2015 >  - PFTs  FEV1 2.65 ( 86%) ratio 69 p 15 % better from saba , dlco 44 corrcts to 93 and erv 56  - d/c breo/ incruse 11/30/2015 due to cough and just use saba prn  - 11/30/2015  extensive coaching HFA effectiveness =    75%   No better on dpi/ pred so favor uacs here (see sep a/p) and should be ok off dpi for now / f/u in 4 weeks to close the loop

## 2015-11-30 NOTE — Progress Notes (Signed)
Subjective:    Patient ID: William Clay, male   DOB: 03-Feb-1941     MRN: [id number]    Brief patient profile:  20 yowm quit smoking 2009  Dx copd 2010 referrred 03/15/2015 by Dr Dirk Dress for sob with GOLD I criteria 04/26/2015    History of Present Illness  03/15/2015 1st [facility] office visit/ Taite Baldassari   Chief Complaint  Patient presents with  . Pulmonary Consult    Referred by Dr. [name] for eval of COPD. Pt states dxed with COPD in 2010. He c/o cough, SOB and hoarsenss. He states that his SOB "depends on the weather".    indolent onset of progressive doe after quit smoking and gradually has needed more and more duoneb with less and less benefit. Cough is dry and day > noct. Hot or cold weather makes it worse.  Presently doe x 50 ft/ assoc with maybe 25 lb wt gain since quit smoking  No better on breo, symbiocort or spiriva and couldn't afford them anyway  rec Stop lisinopril and start valsartan 80 mg daily in its place Increase omeprazole to 20 mg Take 30- 60 min before your first and last meals of the day  GERD  Diet  Only use your albuterol as rescue   04/26/2015 f/u ov/Marylee Belzer re: GOLD I  COPD  Chief Complaint  Patient presents with  . Follow-up    PFT done today. Cough, hoarseness and SOB are unchanged since the last visit.   Not limited by breathing from desired activities   Wakes up each am and uses neb saba  to get his motor running but then nothing needed p that all day  rec Ok to use albuterol up to twice daily but if needing more please return  You have the mildest form of copd there is = GOLD I and there is no need for any form of stronger medications at this point (prn saba)     11/30/2015 acute extended ov/Sarah-Jane Nazario re: GOLD I copd  Chief Complaint  Patient presents with  . Acute Visit    Pt c/o increased cough, SOB and hoarsness since 10/15/15. He states his sats have been low with exertion, but he does not have supplemental o2. He occ produces some  yellow/green sputum.  He c/o CP off and on x 3 days- worse when he lies down.   acute cough 10/15/15 and never improved so  BREO /Incruse  multiple abx/ pred added and no better  > last dose of pred one day prior to OV   Most of his cough is non productive, assoc with harsh cough and sense of choking on "sinus drainage" but no other nasal symptoms   No obvious day to day or daytime variabilty or assoc  cp or chest tightness, subjective wheeze or overt  hb symptoms. No unusual exp hx or h/o childhood pna/ asthma or knowledge of premature birth.   Also denies any obvious fluctuation of symptoms with weather or environmental changes or other aggravating or alleviating factors except as outlined above   Current Medications, Allergies, Complete Past Medical History, Past Surgical History, Family History, and Social History were reviewed in [employer] record.  ROS  The following are not active complaints unless bolded sore throat, dysphagia, dental problems, itching, sneezing,  nasal congestion or excess/ purulent secretions, ear ache,   fever, chills, sweats, unintended wt loss, pleuritic or exertional cp, hemoptysis,  orthopnea pnd or leg swelling, presyncope, palpitations, heartburn, abdominal pain,  anorexia, nausea, vomiting, diarrhea  or change in bowel or urinary habits, change in stools or urine, dysuria,hematuria,  rash, arthralgias, visual complaints, headache, numbness weakness or ataxia or problems with walking or coordination,  change in mood/affect or memory.               Objective:   Physical Exam   Severely hoarse wm nad/ prominent pseudowheeze      Wt Readings from Last 3 Encounters:  11/30/15 213 lb 6.4 oz (96.798 kg)  04/26/15 215 lb (97.523 kg)  03/15/15 210 lb 9.6 oz (95.528 kg)    Vital signs reviewed    HEENT: nl dentition, turbinates, and orophanx. Nl external ear canals without cough reflex   NECK :  without JVD/Nodes/TM/ nl carotid  upstrokes bilaterally   LUNGS: no acc muscle use, clear to A and P bilaterally without cough on insp or exp maneuvers   CV:  RRR  no s3 or murmur or increase in P2, no edema   ABD:  soft and nontender with end insp hoover's. No bruits or organomegaly, bowel sounds nl  MS:  warm without deformities, calf tenderness, cyanosis or clubbing  SKIN: warm and dry without lesions    NEURO:  alert, approp, no deficits    I personally reviewed images and agree with radiology impression as follows:  CT Chest   11/28/15  Non specific GG changes c/w small airways dz            Assessment:

## 2015-11-30 NOTE — Assessment & Plan Note (Addendum)
-   trial off acei  03/15/2015 >  - PFTs  FEV1 2.65 ( 86%) ratio 69 p 15 % better from saba , dlco 44 corrcts to 93 and erv 56  - d/c breo/ incruse 11/30/2015 due to cough and just use saba prn  - 11/30/2015  extensive coaching HFA effectiveness =    75%   No better on dpi/ pred so favor uacs here (see sep a/p) and should be ok off dpi for now / f/u in 4 weeks to close the loop

## 2015-11-30 NOTE — Patient Instructions (Addendum)
depomedrol 120 mg  IM  Stop BREO and incruse   Try prilosec 40mg   Take 30-60 min before first meal of the day and Pepcid ac (famotidine) 20 mg one @  bedtime until cough is completely gone for at least a week without the need for cough suppression  GERD (REFLUX)  is an extremely common cause of respiratory symptoms just like yours , many times with no obvious heartburn at all.    It can be treated with medication, but also with lifestyle changes including elevation of the head of your bed (ideally with 6 inch  bed blocks),  Smoking cessation, avoidance of late meals, excessive alcohol, and avoid fatty foods, chocolate, peppermint, colas, red wine, and acidic juices such as orange juice.  NO MINT OR MENTHOL PRODUCTS SO NO COUGH DROPS  USE SUGARLESS CANDY INSTEAD (Jolley ranchers or Stover's or Life Savers) or even ice chips will also do - the key is to swallow to prevent all throat clearing. NO OIL BASED VITAMINS - use powdered substitutes.    Take delsym two tsp every 12 hours and supplement if needed with  tramadol 50 mg up to 1 every 4 hours to suppress the urge to cough. Swallowing water or using ice chips/non mint and menthol containing candies (such as lifesavers or sugarless jolly ranchers) are also effective.  You should rest your voice and avoid activities that you know make you cough.  Once you have eliminated the cough for 3 straight days try reducing the tramadol first,  then the delsym as tolerated.    Please schedule a follow up office visit in 4 weeks, sooner if needed  Late add consider flutter next

## 2015-12-14 DIAGNOSIS — D72829 Elevated white blood cell count, unspecified: Secondary | ICD-10-CM | POA: Diagnosis not present

## 2015-12-14 DIAGNOSIS — R6889 Other general symptoms and signs: Secondary | ICD-10-CM | POA: Diagnosis not present

## 2015-12-14 DIAGNOSIS — Z5181 Encounter for therapeutic drug level monitoring: Secondary | ICD-10-CM | POA: Diagnosis not present

## 2015-12-22 ENCOUNTER — Encounter: Payer: Self-pay | Admitting: Internal Medicine

## 2015-12-24 HISTORY — PX: CATARACT EXTRACTION: SUR2

## 2015-12-24 HISTORY — PX: ROTATOR CUFF REPAIR: SHX139

## 2015-12-31 DIAGNOSIS — M150 Primary generalized (osteo)arthritis: Secondary | ICD-10-CM | POA: Diagnosis present

## 2015-12-31 DIAGNOSIS — Z87891 Personal history of nicotine dependence: Secondary | ICD-10-CM | POA: Diagnosis not present

## 2015-12-31 DIAGNOSIS — E7800 Pure hypercholesterolemia, unspecified: Secondary | ICD-10-CM | POA: Diagnosis present

## 2015-12-31 DIAGNOSIS — J9600 Acute respiratory failure, unspecified whether with hypoxia or hypercapnia: Secondary | ICD-10-CM | POA: Diagnosis not present

## 2015-12-31 DIAGNOSIS — R05 Cough: Secondary | ICD-10-CM | POA: Diagnosis not present

## 2015-12-31 DIAGNOSIS — D72829 Elevated white blood cell count, unspecified: Secondary | ICD-10-CM | POA: Diagnosis not present

## 2015-12-31 DIAGNOSIS — J9690 Respiratory failure, unspecified, unspecified whether with hypoxia or hypercapnia: Secondary | ICD-10-CM | POA: Diagnosis not present

## 2015-12-31 DIAGNOSIS — J440 Chronic obstructive pulmonary disease with acute lower respiratory infection: Secondary | ICD-10-CM | POA: Diagnosis present

## 2015-12-31 DIAGNOSIS — Z79899 Other long term (current) drug therapy: Secondary | ICD-10-CM | POA: Diagnosis not present

## 2015-12-31 DIAGNOSIS — D72825 Bandemia: Secondary | ICD-10-CM | POA: Diagnosis not present

## 2015-12-31 DIAGNOSIS — Z85828 Personal history of other malignant neoplasm of skin: Secondary | ICD-10-CM | POA: Diagnosis not present

## 2015-12-31 DIAGNOSIS — M159 Polyosteoarthritis, unspecified: Secondary | ICD-10-CM | POA: Diagnosis not present

## 2015-12-31 DIAGNOSIS — M6281 Muscle weakness (generalized): Secondary | ICD-10-CM | POA: Diagnosis not present

## 2015-12-31 DIAGNOSIS — J189 Pneumonia, unspecified organism: Secondary | ICD-10-CM | POA: Diagnosis not present

## 2015-12-31 DIAGNOSIS — R0602 Shortness of breath: Secondary | ICD-10-CM | POA: Diagnosis not present

## 2015-12-31 DIAGNOSIS — I10 Essential (primary) hypertension: Secondary | ICD-10-CM | POA: Diagnosis not present

## 2015-12-31 DIAGNOSIS — Z8701 Personal history of pneumonia (recurrent): Secondary | ICD-10-CM | POA: Diagnosis not present

## 2015-12-31 DIAGNOSIS — Z888 Allergy status to other drugs, medicaments and biological substances status: Secondary | ICD-10-CM | POA: Diagnosis not present

## 2015-12-31 DIAGNOSIS — J9621 Acute and chronic respiratory failure with hypoxia: Secondary | ICD-10-CM | POA: Diagnosis not present

## 2015-12-31 DIAGNOSIS — E789 Disorder of lipoprotein metabolism, unspecified: Secondary | ICD-10-CM | POA: Diagnosis not present

## 2015-12-31 DIAGNOSIS — K219 Gastro-esophageal reflux disease without esophagitis: Secondary | ICD-10-CM | POA: Diagnosis not present

## 2015-12-31 DIAGNOSIS — R918 Other nonspecific abnormal finding of lung field: Secondary | ICD-10-CM | POA: Diagnosis not present

## 2015-12-31 DIAGNOSIS — E785 Hyperlipidemia, unspecified: Secondary | ICD-10-CM | POA: Diagnosis not present

## 2015-12-31 DIAGNOSIS — J181 Lobar pneumonia, unspecified organism: Secondary | ICD-10-CM | POA: Diagnosis not present

## 2015-12-31 DIAGNOSIS — R262 Difficulty in walking, not elsewhere classified: Secondary | ICD-10-CM | POA: Diagnosis not present

## 2015-12-31 DIAGNOSIS — J441 Chronic obstructive pulmonary disease with (acute) exacerbation: Secondary | ICD-10-CM | POA: Diagnosis not present

## 2015-12-31 DIAGNOSIS — Z881 Allergy status to other antibiotic agents status: Secondary | ICD-10-CM | POA: Diagnosis not present

## 2015-12-31 DIAGNOSIS — G4733 Obstructive sleep apnea (adult) (pediatric): Secondary | ICD-10-CM | POA: Diagnosis present

## 2015-12-31 DIAGNOSIS — Z452 Encounter for adjustment and management of vascular access device: Secondary | ICD-10-CM | POA: Diagnosis not present

## 2016-01-01 ENCOUNTER — Ambulatory Visit: Payer: Medicare Other | Admitting: Internal Medicine

## 2016-01-06 DIAGNOSIS — R262 Difficulty in walking, not elsewhere classified: Secondary | ICD-10-CM | POA: Diagnosis not present

## 2016-01-06 DIAGNOSIS — G4733 Obstructive sleep apnea (adult) (pediatric): Secondary | ICD-10-CM | POA: Diagnosis not present

## 2016-01-06 DIAGNOSIS — D72829 Elevated white blood cell count, unspecified: Secondary | ICD-10-CM | POA: Diagnosis not present

## 2016-01-06 DIAGNOSIS — J449 Chronic obstructive pulmonary disease, unspecified: Secondary | ICD-10-CM | POA: Diagnosis not present

## 2016-01-06 DIAGNOSIS — E789 Disorder of lipoprotein metabolism, unspecified: Secondary | ICD-10-CM | POA: Diagnosis not present

## 2016-01-06 DIAGNOSIS — R05 Cough: Secondary | ICD-10-CM | POA: Diagnosis not present

## 2016-01-06 DIAGNOSIS — E784 Other hyperlipidemia: Secondary | ICD-10-CM | POA: Diagnosis not present

## 2016-01-06 DIAGNOSIS — R490 Dysphonia: Secondary | ICD-10-CM | POA: Diagnosis not present

## 2016-01-06 DIAGNOSIS — I10 Essential (primary) hypertension: Secondary | ICD-10-CM | POA: Diagnosis not present

## 2016-01-06 DIAGNOSIS — J9690 Respiratory failure, unspecified, unspecified whether with hypoxia or hypercapnia: Secondary | ICD-10-CM | POA: Diagnosis not present

## 2016-01-06 DIAGNOSIS — E785 Hyperlipidemia, unspecified: Secondary | ICD-10-CM | POA: Diagnosis not present

## 2016-01-06 DIAGNOSIS — D72825 Bandemia: Secondary | ICD-10-CM | POA: Diagnosis not present

## 2016-01-06 DIAGNOSIS — M6281 Muscle weakness (generalized): Secondary | ICD-10-CM | POA: Diagnosis not present

## 2016-01-06 DIAGNOSIS — J189 Pneumonia, unspecified organism: Secondary | ICD-10-CM | POA: Diagnosis not present

## 2016-01-06 DIAGNOSIS — M159 Polyosteoarthritis, unspecified: Secondary | ICD-10-CM | POA: Diagnosis not present

## 2016-01-08 DIAGNOSIS — J189 Pneumonia, unspecified organism: Secondary | ICD-10-CM | POA: Diagnosis not present

## 2016-01-08 DIAGNOSIS — I10 Essential (primary) hypertension: Secondary | ICD-10-CM | POA: Diagnosis not present

## 2016-01-08 DIAGNOSIS — R490 Dysphonia: Secondary | ICD-10-CM | POA: Diagnosis not present

## 2016-01-08 DIAGNOSIS — J449 Chronic obstructive pulmonary disease, unspecified: Secondary | ICD-10-CM | POA: Diagnosis not present

## 2016-01-11 ENCOUNTER — Encounter: Payer: Self-pay | Admitting: Internal Medicine

## 2016-01-11 ENCOUNTER — Ambulatory Visit (INDEPENDENT_AMBULATORY_CARE_PROVIDER_SITE_OTHER)
Admission: RE | Admit: 2016-01-11 | Discharge: 2016-01-11 | Disposition: A | Discharge status: Home / Self Care | Payer: Medicare Other | Source: Ambulatory Visit | Attending: Internal Medicine | Admitting: Internal Medicine

## 2016-01-11 ENCOUNTER — Ambulatory Visit (INDEPENDENT_AMBULATORY_CARE_PROVIDER_SITE_OTHER): Payer: Medicare Other | Admitting: Internal Medicine

## 2016-01-11 VITALS — BP 104/60 | HR 82 | Ht 69.0 in | Wt 211.6 lb

## 2016-01-11 DIAGNOSIS — J449 Chronic obstructive pulmonary disease, unspecified: Secondary | ICD-10-CM

## 2016-01-11 DIAGNOSIS — R490 Dysphonia: Secondary | ICD-10-CM | POA: Diagnosis not present

## 2016-01-11 DIAGNOSIS — R05 Cough: Secondary | ICD-10-CM

## 2016-01-11 DIAGNOSIS — I10 Essential (primary) hypertension: Secondary | ICD-10-CM | POA: Diagnosis not present

## 2016-01-11 DIAGNOSIS — E784 Other hyperlipidemia: Secondary | ICD-10-CM | POA: Diagnosis not present

## 2016-01-11 DIAGNOSIS — J189 Pneumonia, unspecified organism: Secondary | ICD-10-CM | POA: Diagnosis not present

## 2016-01-11 DIAGNOSIS — R058 Other specified cough: Secondary | ICD-10-CM

## 2016-01-11 NOTE — Progress Notes (Signed)
Subjective:    Patient ID: William Clay, male   DOB: 05/16/1941     MRN: [id number]    Brief patient profile:  28 yowm quit smoking 2009  Dx copd 2010 referrred 03/15/2015 by Dr Dirk Dress for sob with GOLD I criteria 04/26/2015    History of Present Illness  03/15/2015 1st [facility] office visit/ William Clay   Chief Complaint  Patient presents with  . Pulmonary Consult    Referred by Dr. [name] for eval of COPD. Pt states dxed with COPD in 2010. He c/o cough, SOB and hoarsenss. He states that his SOB "depends on the weather".    indolent onset of progressive doe after quit smoking and gradually has needed more and more duoneb with less and less benefit. Cough is dry and day > noct. Hot or cold weather makes it worse.  Presently doe x 50 ft/ assoc with maybe 25 lb wt gain since quit smoking  No better on breo, symbiocort or spiriva and couldn't afford them anyway  rec Stop lisinopril and start valsartan 80 mg daily in its place Increase omeprazole to 20 mg Take 30- 60 min before your first and last meals of the day  GERD  Diet  Only use your albuterol as rescue   04/26/2015 f/u ov/Janis Cuffe re: GOLD I  COPD  Chief Complaint  Patient presents with  . Follow-up    PFT done today. Cough, hoarseness and SOB are unchanged since the last visit.   Not limited by breathing from desired activities   Wakes up each am and uses neb saba  to get his motor running but then nothing needed p that all day  rec Ok to use albuterol up to twice daily but if needing more please return  You have the mildest form of copd there is = GOLD I and there is no need for any form of stronger medications at this point (prn saba)     11/30/2015 acute extended ov/Cyara Devoto re: GOLD I copd  Chief Complaint  Patient presents with  . Acute Visit    Pt c/o increased cough, SOB and hoarsness since 10/15/15. He states his sats have been low with exertion, but he does not have supplemental o2. He occ produces some  yellow/green sputum.  He c/o CP off and on x 3 days- worse when he lies down.   acute cough 10/15/15 and never improved so  BREO /Incruse  multiple abx/ pred added and no better  > last dose of pred one day prior to OV   Most of his cough is non productive, assoc with harsh cough and sense of choking on "sinus drainage" but no other nasal symptoms rec depomedrol 120 mg  IM Stop BREO and incruse  Try prilosec 40mg   Take 30-60 min before first meal of the day and Pepcid ac (famotidine) 20 mg one @  bedtime until cough is completely gone for at least a week without the need for cough suppression GERD diet    01/11/2016  f/u ov/Aksh Swart re: GOLD I copd/ cc  cough since 10/15/15 woke up with it and never resolved  Chief Complaint  Patient presents with  . Follow-up    4 wk. f/u-[facility].1 wk. ago,pneumonia.Sob better with exertion now. Currently in [facility].4 days ago had coughing with tan and brown,no blood.Did not receive abx.until late,Cough better now clear,still has hoarseness but better.Occass. wheezing,mid-chest tightness. Runny nose-clear occass.No fever,sweats easily.PND, no sorethroat.  says had swallowing eval at [facility]  but not in d/c summary. Has f/u planned with ent / GI in [place]  No obvious day to day or daytime variabilty or assoc cp or chest tightness, subjective wheeze or overt  hb symptoms. No unusual exp hx or h/o childhood pna/ asthma or knowledge of premature birth.   Also denies any obvious fluctuation of symptoms with weather or environmental changes or other aggravating or alleviating factors except as outlined above   Current Medications, Allergies, Complete Past Medical History, Past Surgical History, Family History, and Social History were reviewed in [employer] record.  ROS  The following are not active complaints unless bolded sore throat, dysphagia, dental problems, itching, sneezing,  nasal congestion or excess/ purulent  secretions, ear ache,   fever, chills, sweats, unintended wt loss, pleuritic or exertional cp, hemoptysis,  orthopnea pnd or leg swelling, presyncope, palpitations, heartburn, abdominal pain, anorexia, nausea, vomiting, diarrhea  or change in bowel or urinary habits, change in stools or urine, dysuria,hematuria,  rash, arthralgias, visual complaints, headache, numbness weakness or ataxia or problems with walking or coordination,  change in mood/affect or memory.               Objective:   Physical Exam    hoarse wm nad / congested sounding upper airway coughing     Wt Readings from Last 3 Encounters:  01/11/16 211 lb 9.6 oz (95.981 kg)  11/30/15 213 lb 6.4 oz (96.798 kg)  04/26/15 215 lb (97.523 kg)    Vital signs reviewed    HEENT: edentulous/ nl  turbinates, and orophanx. Nl external ear canals without cough reflex   NECK :  without JVD/Nodes/TM/ nl carotid upstrokes bilaterally   LUNGS: no acc muscle use,  Min insp and exp rhonchi bilaterally    CV:  RRR  no s3 or murmur or increase in P2, no edema   ABD:  soft and nontender with end insp hoover's. No bruits or organomegaly, bowel sounds nl  MS:  warm without deformities, calf tenderness, cyanosis or clubbing  SKIN: warm and dry without lesions    NEURO:  alert, approp, no deficits       CXR PA and Lateral:   01/11/2016 :    I personally reviewed images and agree with radiology impression as follows:    The lungs are hyperinflated likely secondary to COPD. There is no focal parenchymal opacity. There is no pleural effusion or pneumothorax. The heart and mediastinal contours are unremarkable.  There is mild thoracic spine spondylosis       Assessment:

## 2016-01-11 NOTE — Progress Notes (Signed)
Quick Note:  LMTCB ______ 

## 2016-01-11 NOTE — Patient Instructions (Addendum)
Please remember to go to the x-ray department downstairs for your tests - we will call you with the results when they are available.  Make sure you are on Prilosec 40 mg Take 30-60 min before first meal of the day and Pepcid 20 mg ac at bedtime  Blood pressure management per primary but avoid ACEi and losartan   Keep appts to see Drs for your throat (ent) and esophagus (GI) and have them send me their reports, please!  GERD (REFLUX)  is an extremely common cause of respiratory symptoms just like yours , many times with no obvious heartburn at all.    It can be treated with medication, but also with lifestyle changes including elevation of the head of your bed (ideally with 6 inch  bed blocks),  Smoking cessation, avoidance of late meals, excessive alcohol, and avoid fatty foods, chocolate, peppermint, colas, red wine, and acidic juices such as orange juice.  NO MINT OR MENTHOL PRODUCTS SO NO COUGH DROPS  USE SUGARLESS CANDY INSTEAD (Jolley ranchers or Stover's or Life Savers) or even ice chips will also do - the key is to swallow to prevent all throat clearing. NO OIL BASED VITAMINS - use powdered substitutes.   For breathing > duoneb half dose every 4 hours if needed   For cough > flutter valve   If you are satisfied with your treatment plan,  let your doctor know and he/she can either refill your medications or you can return here when your prescription runs out.     If in any way you are not 100% satisfied,  please tell us.  If 100% better, tell your friends!  Pulmonary follow up is as needed

## 2016-01-12 ENCOUNTER — Telehealth: Payer: Self-pay | Admitting: Internal Medicine

## 2016-01-12 NOTE — Telephone Encounter (Signed)
Result Notes     Notes Recorded by Rosana Berger, CMA on 01/11/2016 at 1:56 PM LMTCB ------  Notes Recorded by Tanda Rockers, MD on 01/11/2016 at 1:10 PM Call pt: Reviewed cxr and no acute change so no change in recommendations made at ov - no pna   Pt is aware of results. Nothing further was needed.

## 2016-01-14 NOTE — Assessment & Plan Note (Addendum)
-  d/c acei 03/15/2015 due to pseudowheeze > resolved 04/26/2015 > recurred abruptly 10/15/15 in setting of uri Sinus CT 11/30/2015 > one opacified ethnoid, o/w wnl  I am concerned about aspiration but did not see any evaluation for this as inpt in the d/c summary thought the pt says it was done. Repeat gi/ent eval pending and in meantime needs max gerd rx/ diet/ reviewed today

## 2016-01-14 NOTE — Assessment & Plan Note (Signed)
Try off acei 03/15/2015 due to pseudowheeze > resolved 04/26/2015   Although even in retrospect it may not be clear the ACEi contributed to the pt's symptoms,  Pt improved off them and adding them back at this point or in the future would risk confusion in interpretation of non-specific respiratory symptoms to which this patient is prone  ie  Better not to muddy the waters here.

## 2016-01-14 NOTE — Assessment & Plan Note (Signed)
-   trial off acei  03/15/2015 >  - PFTs  FEV1 2.65 ( 86%) ratio 69 p 15 % better from saba , dlco 44 corrcts to 93 and erv 56  - d/c breo/ incruse 11/30/2015 due to cough and just use saba prn  - 11/30/2015  extensive coaching HFA effectiveness =    75%   Presently just on duoneb/ I would consider trial of laba/ics (perforomist/budesonide) if symptoms continue and after gi/ent eval complete but for now most of his symptoms appear to be upper airway.  I had an extended discussion with the patient reviewing all relevant studies completed to date and  lasting 15 to 20 minutes of a 25 minute visit    Each maintenance medication was reviewed in detail including most importantly the difference between maintenance and prns and under what circumstances the prns are to be triggered using an action plan format that is not reflected in the computer generated alphabetically organized AVS.    Please see instructions for details which were reviewed in writing and the patient given a copy highlighting the part that I personally wrote and discussed at today's ov.

## 2016-01-14 NOTE — Assessment & Plan Note (Signed)
>>  ASSESSMENT AND PLAN FOR CHRONIC OBSTRUCTIVE PULMONARY DISEASE (HCC) WRITTEN ON 01/14/2016  5:58 AM BY Sherene Sires, MICHAEL B, MD  - trial off acei  03/15/2015 >  - PFTs  FEV1 2.65 ( 86%) ratio 69 p 15 % better from saba , dlco 44 corrcts to 93 and erv 56  - d/c breo/ incruse 11/30/2015 due to cough and just use saba prn  - 11/30/2015  extensive coaching HFA effectiveness =    75%   Presently just on duoneb/ I would consider trial of laba/ics (perforomist/budesonide) if symptoms continue and after gi/ent eval complete but for now most of his symptoms appear to be upper airway.  I had an extended discussion with the patient reviewing all relevant studies completed to date and  lasting 15 to 20 minutes of a 25 minute visit    Each maintenance medication was reviewed in detail including most importantly the difference between maintenance and prns and under what circumstances the prns are to be triggered using an action plan format that is not reflected in the computer generated alphabetically organized AVS.    Please see instructions for details which were reviewed in writing and the patient given a copy highlighting the part that I personally wrote and discussed at today's ov.

## 2016-01-17 DIAGNOSIS — J158 Pneumonia due to other specified bacteria: Secondary | ICD-10-CM | POA: Diagnosis not present

## 2016-01-17 DIAGNOSIS — R498 Other voice and resonance disorders: Secondary | ICD-10-CM | POA: Diagnosis not present

## 2016-01-17 DIAGNOSIS — J410 Simple chronic bronchitis: Secondary | ICD-10-CM | POA: Diagnosis not present

## 2016-01-17 DIAGNOSIS — J9601 Acute respiratory failure with hypoxia: Secondary | ICD-10-CM | POA: Diagnosis not present

## 2016-01-17 DIAGNOSIS — D72828 Other elevated white blood cell count: Secondary | ICD-10-CM | POA: Diagnosis not present

## 2016-01-17 DIAGNOSIS — K219 Gastro-esophageal reflux disease without esophagitis: Secondary | ICD-10-CM | POA: Diagnosis not present

## 2016-01-17 DIAGNOSIS — E782 Mixed hyperlipidemia: Secondary | ICD-10-CM | POA: Diagnosis not present

## 2016-01-17 DIAGNOSIS — R1319 Other dysphagia: Secondary | ICD-10-CM | POA: Diagnosis not present

## 2016-01-25 DIAGNOSIS — J410 Simple chronic bronchitis: Secondary | ICD-10-CM | POA: Diagnosis not present

## 2016-01-25 DIAGNOSIS — E782 Mixed hyperlipidemia: Secondary | ICD-10-CM | POA: Diagnosis not present

## 2016-01-25 DIAGNOSIS — R498 Other voice and resonance disorders: Secondary | ICD-10-CM | POA: Diagnosis not present

## 2016-01-25 DIAGNOSIS — D72828 Other elevated white blood cell count: Secondary | ICD-10-CM | POA: Diagnosis not present

## 2016-01-25 DIAGNOSIS — J158 Pneumonia due to other specified bacteria: Secondary | ICD-10-CM | POA: Diagnosis not present

## 2016-01-25 DIAGNOSIS — J9601 Acute respiratory failure with hypoxia: Secondary | ICD-10-CM | POA: Diagnosis not present

## 2016-01-25 DIAGNOSIS — K219 Gastro-esophageal reflux disease without esophagitis: Secondary | ICD-10-CM | POA: Diagnosis not present

## 2016-01-25 DIAGNOSIS — R1319 Other dysphagia: Secondary | ICD-10-CM | POA: Diagnosis not present

## 2016-01-31 DIAGNOSIS — R7301 Impaired fasting glucose: Secondary | ICD-10-CM | POA: Diagnosis not present

## 2016-01-31 DIAGNOSIS — D72828 Other elevated white blood cell count: Secondary | ICD-10-CM | POA: Diagnosis not present

## 2016-01-31 DIAGNOSIS — E782 Mixed hyperlipidemia: Secondary | ICD-10-CM | POA: Diagnosis not present

## 2016-02-01 DIAGNOSIS — R1310 Dysphagia, unspecified: Secondary | ICD-10-CM | POA: Diagnosis not present

## 2016-02-01 DIAGNOSIS — K219 Gastro-esophageal reflux disease without esophagitis: Secondary | ICD-10-CM | POA: Diagnosis not present

## 2016-02-02 DIAGNOSIS — R202 Paresthesia of skin: Secondary | ICD-10-CM | POA: Diagnosis not present

## 2016-02-02 DIAGNOSIS — J410 Simple chronic bronchitis: Secondary | ICD-10-CM | POA: Diagnosis not present

## 2016-02-02 DIAGNOSIS — R7301 Impaired fasting glucose: Secondary | ICD-10-CM | POA: Diagnosis not present

## 2016-02-02 DIAGNOSIS — G4733 Obstructive sleep apnea (adult) (pediatric): Secondary | ICD-10-CM | POA: Diagnosis not present

## 2016-02-02 DIAGNOSIS — M791 Myalgia: Secondary | ICD-10-CM | POA: Diagnosis not present

## 2016-02-02 DIAGNOSIS — E782 Mixed hyperlipidemia: Secondary | ICD-10-CM | POA: Diagnosis not present

## 2016-02-02 DIAGNOSIS — I10 Essential (primary) hypertension: Secondary | ICD-10-CM | POA: Diagnosis not present

## 2016-02-05 DIAGNOSIS — R05 Cough: Secondary | ICD-10-CM | POA: Diagnosis not present

## 2016-02-05 DIAGNOSIS — J342 Deviated nasal septum: Secondary | ICD-10-CM | POA: Diagnosis not present

## 2016-02-05 DIAGNOSIS — R1312 Dysphagia, oropharyngeal phase: Secondary | ICD-10-CM | POA: Diagnosis not present

## 2016-02-05 DIAGNOSIS — R490 Dysphonia: Secondary | ICD-10-CM | POA: Diagnosis not present

## 2016-03-06 DIAGNOSIS — R05 Cough: Secondary | ICD-10-CM | POA: Diagnosis not present

## 2016-03-06 DIAGNOSIS — R1310 Dysphagia, unspecified: Secondary | ICD-10-CM | POA: Diagnosis not present

## 2016-03-06 DIAGNOSIS — K219 Gastro-esophageal reflux disease without esophagitis: Secondary | ICD-10-CM | POA: Diagnosis not present

## 2016-03-11 DIAGNOSIS — R1310 Dysphagia, unspecified: Secondary | ICD-10-CM | POA: Diagnosis not present

## 2016-03-11 DIAGNOSIS — M75120 Complete rotator cuff tear or rupture of unspecified shoulder, not specified as traumatic: Secondary | ICD-10-CM | POA: Diagnosis not present

## 2016-03-11 DIAGNOSIS — Z01818 Encounter for other preprocedural examination: Secondary | ICD-10-CM | POA: Diagnosis not present

## 2016-03-11 DIAGNOSIS — M25512 Pain in left shoulder: Secondary | ICD-10-CM | POA: Diagnosis not present

## 2016-03-11 DIAGNOSIS — R05 Cough: Secondary | ICD-10-CM | POA: Diagnosis not present

## 2016-03-19 DIAGNOSIS — G8918 Other acute postprocedural pain: Secondary | ICD-10-CM | POA: Diagnosis not present

## 2016-03-19 DIAGNOSIS — M7552 Bursitis of left shoulder: Secondary | ICD-10-CM | POA: Diagnosis not present

## 2016-03-19 DIAGNOSIS — J449 Chronic obstructive pulmonary disease, unspecified: Secondary | ICD-10-CM | POA: Diagnosis not present

## 2016-03-19 DIAGNOSIS — E7800 Pure hypercholesterolemia, unspecified: Secondary | ICD-10-CM | POA: Diagnosis not present

## 2016-03-19 DIAGNOSIS — M75122 Complete rotator cuff tear or rupture of left shoulder, not specified as traumatic: Secondary | ICD-10-CM | POA: Diagnosis not present

## 2016-03-19 DIAGNOSIS — M7582 Other shoulder lesions, left shoulder: Secondary | ICD-10-CM | POA: Diagnosis not present

## 2016-03-19 DIAGNOSIS — Z9981 Dependence on supplemental oxygen: Secondary | ICD-10-CM | POA: Diagnosis not present

## 2016-03-19 DIAGNOSIS — M13812 Other specified arthritis, left shoulder: Secondary | ICD-10-CM | POA: Diagnosis not present

## 2016-03-19 DIAGNOSIS — Z87891 Personal history of nicotine dependence: Secondary | ICD-10-CM | POA: Diagnosis not present

## 2016-03-19 DIAGNOSIS — M75121 Complete rotator cuff tear or rupture of right shoulder, not specified as traumatic: Secondary | ICD-10-CM | POA: Diagnosis not present

## 2016-03-19 DIAGNOSIS — M75120 Complete rotator cuff tear or rupture of unspecified shoulder, not specified as traumatic: Secondary | ICD-10-CM | POA: Diagnosis not present

## 2016-03-19 DIAGNOSIS — J302 Other seasonal allergic rhinitis: Secondary | ICD-10-CM | POA: Diagnosis not present

## 2016-03-19 DIAGNOSIS — G4733 Obstructive sleep apnea (adult) (pediatric): Secondary | ICD-10-CM | POA: Diagnosis not present

## 2016-03-19 DIAGNOSIS — Z79899 Other long term (current) drug therapy: Secondary | ICD-10-CM | POA: Diagnosis not present

## 2016-04-19 DIAGNOSIS — H2512 Age-related nuclear cataract, left eye: Secondary | ICD-10-CM | POA: Diagnosis not present

## 2016-04-19 DIAGNOSIS — H04123 Dry eye syndrome of bilateral lacrimal glands: Secondary | ICD-10-CM | POA: Diagnosis not present

## 2016-04-19 DIAGNOSIS — H2511 Age-related nuclear cataract, right eye: Secondary | ICD-10-CM | POA: Diagnosis not present

## 2016-04-19 DIAGNOSIS — H52223 Regular astigmatism, bilateral: Secondary | ICD-10-CM | POA: Diagnosis not present

## 2016-04-26 DIAGNOSIS — M75120 Complete rotator cuff tear or rupture of unspecified shoulder, not specified as traumatic: Secondary | ICD-10-CM | POA: Diagnosis not present

## 2016-04-26 DIAGNOSIS — M6281 Muscle weakness (generalized): Secondary | ICD-10-CM | POA: Diagnosis not present

## 2016-04-26 DIAGNOSIS — M25512 Pain in left shoulder: Secondary | ICD-10-CM | POA: Diagnosis not present

## 2016-04-29 DIAGNOSIS — M75120 Complete rotator cuff tear or rupture of unspecified shoulder, not specified as traumatic: Secondary | ICD-10-CM | POA: Diagnosis not present

## 2016-04-29 DIAGNOSIS — M25512 Pain in left shoulder: Secondary | ICD-10-CM | POA: Diagnosis not present

## 2016-04-29 DIAGNOSIS — M6281 Muscle weakness (generalized): Secondary | ICD-10-CM | POA: Diagnosis not present

## 2016-04-30 DIAGNOSIS — R7301 Impaired fasting glucose: Secondary | ICD-10-CM | POA: Diagnosis not present

## 2016-04-30 DIAGNOSIS — I10 Essential (primary) hypertension: Secondary | ICD-10-CM | POA: Diagnosis not present

## 2016-04-30 DIAGNOSIS — E782 Mixed hyperlipidemia: Secondary | ICD-10-CM | POA: Diagnosis not present

## 2016-05-02 DIAGNOSIS — M25512 Pain in left shoulder: Secondary | ICD-10-CM | POA: Diagnosis not present

## 2016-05-02 DIAGNOSIS — M6281 Muscle weakness (generalized): Secondary | ICD-10-CM | POA: Diagnosis not present

## 2016-05-02 DIAGNOSIS — M75120 Complete rotator cuff tear or rupture of unspecified shoulder, not specified as traumatic: Secondary | ICD-10-CM | POA: Diagnosis not present

## 2016-05-03 DIAGNOSIS — R7301 Impaired fasting glucose: Secondary | ICD-10-CM | POA: Diagnosis not present

## 2016-05-03 DIAGNOSIS — J410 Simple chronic bronchitis: Secondary | ICD-10-CM | POA: Diagnosis not present

## 2016-05-03 DIAGNOSIS — I10 Essential (primary) hypertension: Secondary | ICD-10-CM | POA: Diagnosis not present

## 2016-05-03 DIAGNOSIS — E782 Mixed hyperlipidemia: Secondary | ICD-10-CM | POA: Diagnosis not present

## 2016-05-03 DIAGNOSIS — G4733 Obstructive sleep apnea (adult) (pediatric): Secondary | ICD-10-CM | POA: Diagnosis not present

## 2016-05-03 DIAGNOSIS — M791 Myalgia: Secondary | ICD-10-CM | POA: Diagnosis not present

## 2016-05-09 DIAGNOSIS — M75120 Complete rotator cuff tear or rupture of unspecified shoulder, not specified as traumatic: Secondary | ICD-10-CM | POA: Diagnosis not present

## 2016-05-09 DIAGNOSIS — M25512 Pain in left shoulder: Secondary | ICD-10-CM | POA: Diagnosis not present

## 2016-05-09 DIAGNOSIS — M6281 Muscle weakness (generalized): Secondary | ICD-10-CM | POA: Diagnosis not present

## 2016-05-10 DIAGNOSIS — M75120 Complete rotator cuff tear or rupture of unspecified shoulder, not specified as traumatic: Secondary | ICD-10-CM | POA: Diagnosis not present

## 2016-05-10 DIAGNOSIS — M6281 Muscle weakness (generalized): Secondary | ICD-10-CM | POA: Diagnosis not present

## 2016-05-10 DIAGNOSIS — M25512 Pain in left shoulder: Secondary | ICD-10-CM | POA: Diagnosis not present

## 2016-05-13 DIAGNOSIS — J441 Chronic obstructive pulmonary disease with (acute) exacerbation: Secondary | ICD-10-CM | POA: Diagnosis not present

## 2016-05-14 DIAGNOSIS — M75120 Complete rotator cuff tear or rupture of unspecified shoulder, not specified as traumatic: Secondary | ICD-10-CM | POA: Diagnosis not present

## 2016-05-14 DIAGNOSIS — M6281 Muscle weakness (generalized): Secondary | ICD-10-CM | POA: Diagnosis not present

## 2016-05-14 DIAGNOSIS — M25512 Pain in left shoulder: Secondary | ICD-10-CM | POA: Diagnosis not present

## 2016-05-17 DIAGNOSIS — M6281 Muscle weakness (generalized): Secondary | ICD-10-CM | POA: Diagnosis not present

## 2016-05-17 DIAGNOSIS — M25512 Pain in left shoulder: Secondary | ICD-10-CM | POA: Diagnosis not present

## 2016-05-17 DIAGNOSIS — M75120 Complete rotator cuff tear or rupture of unspecified shoulder, not specified as traumatic: Secondary | ICD-10-CM | POA: Diagnosis not present

## 2016-05-21 DIAGNOSIS — M25512 Pain in left shoulder: Secondary | ICD-10-CM | POA: Diagnosis not present

## 2016-05-21 DIAGNOSIS — M6281 Muscle weakness (generalized): Secondary | ICD-10-CM | POA: Diagnosis not present

## 2016-05-21 DIAGNOSIS — M75120 Complete rotator cuff tear or rupture of unspecified shoulder, not specified as traumatic: Secondary | ICD-10-CM | POA: Diagnosis not present

## 2016-05-24 DIAGNOSIS — M25512 Pain in left shoulder: Secondary | ICD-10-CM | POA: Diagnosis not present

## 2016-05-24 DIAGNOSIS — M75120 Complete rotator cuff tear or rupture of unspecified shoulder, not specified as traumatic: Secondary | ICD-10-CM | POA: Diagnosis not present

## 2016-05-24 DIAGNOSIS — M6281 Muscle weakness (generalized): Secondary | ICD-10-CM | POA: Diagnosis not present

## 2016-05-28 DIAGNOSIS — M75120 Complete rotator cuff tear or rupture of unspecified shoulder, not specified as traumatic: Secondary | ICD-10-CM | POA: Diagnosis not present

## 2016-05-28 DIAGNOSIS — M6281 Muscle weakness (generalized): Secondary | ICD-10-CM | POA: Diagnosis not present

## 2016-05-28 DIAGNOSIS — M25512 Pain in left shoulder: Secondary | ICD-10-CM | POA: Diagnosis not present

## 2016-05-30 DIAGNOSIS — M75120 Complete rotator cuff tear or rupture of unspecified shoulder, not specified as traumatic: Secondary | ICD-10-CM | POA: Diagnosis not present

## 2016-05-30 DIAGNOSIS — M6281 Muscle weakness (generalized): Secondary | ICD-10-CM | POA: Diagnosis not present

## 2016-05-30 DIAGNOSIS — M25512 Pain in left shoulder: Secondary | ICD-10-CM | POA: Diagnosis not present

## 2016-06-03 DIAGNOSIS — M25512 Pain in left shoulder: Secondary | ICD-10-CM | POA: Diagnosis not present

## 2016-06-03 DIAGNOSIS — M75120 Complete rotator cuff tear or rupture of unspecified shoulder, not specified as traumatic: Secondary | ICD-10-CM | POA: Diagnosis not present

## 2016-06-03 DIAGNOSIS — M6281 Muscle weakness (generalized): Secondary | ICD-10-CM | POA: Diagnosis not present

## 2016-06-04 DIAGNOSIS — N400 Enlarged prostate without lower urinary tract symptoms: Secondary | ICD-10-CM | POA: Diagnosis not present

## 2016-06-04 DIAGNOSIS — J449 Chronic obstructive pulmonary disease, unspecified: Secondary | ICD-10-CM | POA: Diagnosis not present

## 2016-06-04 DIAGNOSIS — Z9981 Dependence on supplemental oxygen: Secondary | ICD-10-CM | POA: Diagnosis not present

## 2016-06-04 DIAGNOSIS — K219 Gastro-esophageal reflux disease without esophagitis: Secondary | ICD-10-CM | POA: Diagnosis not present

## 2016-06-04 DIAGNOSIS — E669 Obesity, unspecified: Secondary | ICD-10-CM | POA: Diagnosis not present

## 2016-06-04 DIAGNOSIS — Z79899 Other long term (current) drug therapy: Secondary | ICD-10-CM | POA: Diagnosis not present

## 2016-06-04 DIAGNOSIS — H9193 Unspecified hearing loss, bilateral: Secondary | ICD-10-CM | POA: Diagnosis not present

## 2016-06-04 DIAGNOSIS — I10 Essential (primary) hypertension: Secondary | ICD-10-CM | POA: Diagnosis not present

## 2016-06-04 DIAGNOSIS — H25812 Combined forms of age-related cataract, left eye: Secondary | ICD-10-CM | POA: Diagnosis not present

## 2016-06-04 DIAGNOSIS — G4733 Obstructive sleep apnea (adult) (pediatric): Secondary | ICD-10-CM | POA: Diagnosis not present

## 2016-06-04 DIAGNOSIS — H2512 Age-related nuclear cataract, left eye: Secondary | ICD-10-CM | POA: Diagnosis not present

## 2016-06-07 DIAGNOSIS — M25512 Pain in left shoulder: Secondary | ICD-10-CM | POA: Diagnosis not present

## 2016-06-07 DIAGNOSIS — M6281 Muscle weakness (generalized): Secondary | ICD-10-CM | POA: Diagnosis not present

## 2016-06-07 DIAGNOSIS — M75120 Complete rotator cuff tear or rupture of unspecified shoulder, not specified as traumatic: Secondary | ICD-10-CM | POA: Diagnosis not present

## 2016-06-12 DIAGNOSIS — M75120 Complete rotator cuff tear or rupture of unspecified shoulder, not specified as traumatic: Secondary | ICD-10-CM | POA: Diagnosis not present

## 2016-06-12 DIAGNOSIS — M6281 Muscle weakness (generalized): Secondary | ICD-10-CM | POA: Diagnosis not present

## 2016-06-12 DIAGNOSIS — M25512 Pain in left shoulder: Secondary | ICD-10-CM | POA: Diagnosis not present

## 2016-06-18 DIAGNOSIS — M75120 Complete rotator cuff tear or rupture of unspecified shoulder, not specified as traumatic: Secondary | ICD-10-CM | POA: Diagnosis not present

## 2016-06-18 DIAGNOSIS — M25512 Pain in left shoulder: Secondary | ICD-10-CM | POA: Diagnosis not present

## 2016-06-18 DIAGNOSIS — M6281 Muscle weakness (generalized): Secondary | ICD-10-CM | POA: Diagnosis not present

## 2016-06-20 DIAGNOSIS — M6281 Muscle weakness (generalized): Secondary | ICD-10-CM | POA: Diagnosis not present

## 2016-06-20 DIAGNOSIS — M25512 Pain in left shoulder: Secondary | ICD-10-CM | POA: Diagnosis not present

## 2016-06-20 DIAGNOSIS — M75120 Complete rotator cuff tear or rupture of unspecified shoulder, not specified as traumatic: Secondary | ICD-10-CM | POA: Diagnosis not present

## 2016-06-26 DIAGNOSIS — M25512 Pain in left shoulder: Secondary | ICD-10-CM | POA: Diagnosis not present

## 2016-06-26 DIAGNOSIS — M75120 Complete rotator cuff tear or rupture of unspecified shoulder, not specified as traumatic: Secondary | ICD-10-CM | POA: Diagnosis not present

## 2016-06-26 DIAGNOSIS — M6281 Muscle weakness (generalized): Secondary | ICD-10-CM | POA: Diagnosis not present

## 2016-07-02 DIAGNOSIS — I10 Essential (primary) hypertension: Secondary | ICD-10-CM | POA: Diagnosis not present

## 2016-07-02 DIAGNOSIS — G4733 Obstructive sleep apnea (adult) (pediatric): Secondary | ICD-10-CM | POA: Diagnosis not present

## 2016-07-02 DIAGNOSIS — Z79899 Other long term (current) drug therapy: Secondary | ICD-10-CM | POA: Diagnosis not present

## 2016-07-02 DIAGNOSIS — Z9981 Dependence on supplemental oxygen: Secondary | ICD-10-CM | POA: Diagnosis not present

## 2016-07-02 DIAGNOSIS — Z87891 Personal history of nicotine dependence: Secondary | ICD-10-CM | POA: Diagnosis not present

## 2016-07-02 DIAGNOSIS — H2511 Age-related nuclear cataract, right eye: Secondary | ICD-10-CM | POA: Diagnosis not present

## 2016-07-02 DIAGNOSIS — J449 Chronic obstructive pulmonary disease, unspecified: Secondary | ICD-10-CM | POA: Diagnosis not present

## 2016-07-02 DIAGNOSIS — M4806 Spinal stenosis, lumbar region: Secondary | ICD-10-CM | POA: Diagnosis not present

## 2016-07-02 DIAGNOSIS — H9193 Unspecified hearing loss, bilateral: Secondary | ICD-10-CM | POA: Diagnosis not present

## 2016-07-02 DIAGNOSIS — K219 Gastro-esophageal reflux disease without esophagitis: Secondary | ICD-10-CM | POA: Diagnosis not present

## 2016-07-02 DIAGNOSIS — R7303 Prediabetes: Secondary | ICD-10-CM | POA: Diagnosis not present

## 2016-07-02 DIAGNOSIS — E785 Hyperlipidemia, unspecified: Secondary | ICD-10-CM | POA: Diagnosis not present

## 2016-07-02 DIAGNOSIS — G629 Polyneuropathy, unspecified: Secondary | ICD-10-CM | POA: Diagnosis not present

## 2016-07-17 DIAGNOSIS — M25512 Pain in left shoulder: Secondary | ICD-10-CM | POA: Diagnosis not present

## 2016-07-17 DIAGNOSIS — Z9889 Other specified postprocedural states: Secondary | ICD-10-CM | POA: Diagnosis not present

## 2016-08-01 DIAGNOSIS — Z125 Encounter for screening for malignant neoplasm of prostate: Secondary | ICD-10-CM | POA: Diagnosis not present

## 2016-08-01 DIAGNOSIS — R7301 Impaired fasting glucose: Secondary | ICD-10-CM | POA: Diagnosis not present

## 2016-08-01 DIAGNOSIS — E782 Mixed hyperlipidemia: Secondary | ICD-10-CM | POA: Diagnosis not present

## 2016-08-01 DIAGNOSIS — I10 Essential (primary) hypertension: Secondary | ICD-10-CM | POA: Diagnosis not present

## 2016-08-02 DIAGNOSIS — J431 Panlobular emphysema: Secondary | ICD-10-CM | POA: Diagnosis not present

## 2016-08-02 DIAGNOSIS — Z79899 Other long term (current) drug therapy: Secondary | ICD-10-CM | POA: Diagnosis not present

## 2016-08-02 DIAGNOSIS — I272 Other secondary pulmonary hypertension: Secondary | ICD-10-CM | POA: Diagnosis not present

## 2016-08-02 DIAGNOSIS — M479 Spondylosis, unspecified: Secondary | ICD-10-CM | POA: Diagnosis not present

## 2016-08-02 DIAGNOSIS — J309 Allergic rhinitis, unspecified: Secondary | ICD-10-CM | POA: Diagnosis not present

## 2016-08-02 DIAGNOSIS — E782 Mixed hyperlipidemia: Secondary | ICD-10-CM | POA: Diagnosis not present

## 2016-08-02 DIAGNOSIS — I110 Hypertensive heart disease with heart failure: Secondary | ICD-10-CM | POA: Diagnosis not present

## 2016-08-02 DIAGNOSIS — R0902 Hypoxemia: Secondary | ICD-10-CM | POA: Diagnosis not present

## 2016-08-02 DIAGNOSIS — J410 Simple chronic bronchitis: Secondary | ICD-10-CM | POA: Diagnosis not present

## 2016-08-02 DIAGNOSIS — R0602 Shortness of breath: Secondary | ICD-10-CM | POA: Diagnosis not present

## 2016-08-02 DIAGNOSIS — J9601 Acute respiratory failure with hypoxia: Secondary | ICD-10-CM | POA: Diagnosis not present

## 2016-08-02 DIAGNOSIS — I5032 Chronic diastolic (congestive) heart failure: Secondary | ICD-10-CM | POA: Diagnosis not present

## 2016-08-02 DIAGNOSIS — R7301 Impaired fasting glucose: Secondary | ICD-10-CM | POA: Diagnosis not present

## 2016-08-02 DIAGNOSIS — E785 Hyperlipidemia, unspecified: Secondary | ICD-10-CM | POA: Diagnosis not present

## 2016-08-02 DIAGNOSIS — Z87891 Personal history of nicotine dependence: Secondary | ICD-10-CM | POA: Diagnosis not present

## 2016-08-02 DIAGNOSIS — I10 Essential (primary) hypertension: Secondary | ICD-10-CM | POA: Diagnosis not present

## 2016-08-02 DIAGNOSIS — M1990 Unspecified osteoarthritis, unspecified site: Secondary | ICD-10-CM | POA: Diagnosis not present

## 2016-08-02 DIAGNOSIS — Z9981 Dependence on supplemental oxygen: Secondary | ICD-10-CM | POA: Diagnosis not present

## 2016-08-02 DIAGNOSIS — G4733 Obstructive sleep apnea (adult) (pediatric): Secondary | ICD-10-CM | POA: Diagnosis not present

## 2016-08-02 DIAGNOSIS — J449 Chronic obstructive pulmonary disease, unspecified: Secondary | ICD-10-CM

## 2016-08-02 DIAGNOSIS — G629 Polyneuropathy, unspecified: Secondary | ICD-10-CM | POA: Diagnosis not present

## 2016-08-02 DIAGNOSIS — R0600 Dyspnea, unspecified: Secondary | ICD-10-CM | POA: Diagnosis not present

## 2016-08-02 DIAGNOSIS — K219 Gastro-esophageal reflux disease without esophagitis: Secondary | ICD-10-CM | POA: Diagnosis not present

## 2016-08-02 DIAGNOSIS — E875 Hyperkalemia: Secondary | ICD-10-CM | POA: Diagnosis not present

## 2016-08-03 DIAGNOSIS — J9601 Acute respiratory failure with hypoxia: Secondary | ICD-10-CM | POA: Diagnosis not present

## 2016-08-03 DIAGNOSIS — I10 Essential (primary) hypertension: Secondary | ICD-10-CM | POA: Diagnosis not present

## 2016-08-03 DIAGNOSIS — I5032 Chronic diastolic (congestive) heart failure: Secondary | ICD-10-CM | POA: Diagnosis not present

## 2016-08-03 DIAGNOSIS — I272 Other secondary pulmonary hypertension: Secondary | ICD-10-CM | POA: Diagnosis not present

## 2016-08-03 DIAGNOSIS — E875 Hyperkalemia: Secondary | ICD-10-CM | POA: Diagnosis not present

## 2016-08-03 DIAGNOSIS — I361 Nonrheumatic tricuspid (valve) insufficiency: Secondary | ICD-10-CM | POA: Diagnosis not present

## 2016-08-12 DIAGNOSIS — R0902 Hypoxemia: Secondary | ICD-10-CM | POA: Diagnosis not present

## 2016-08-12 DIAGNOSIS — E782 Mixed hyperlipidemia: Secondary | ICD-10-CM | POA: Diagnosis not present

## 2016-08-12 DIAGNOSIS — Z23 Encounter for immunization: Secondary | ICD-10-CM | POA: Diagnosis not present

## 2016-08-12 DIAGNOSIS — I272 Other secondary pulmonary hypertension: Secondary | ICD-10-CM | POA: Diagnosis not present

## 2016-08-12 DIAGNOSIS — J410 Simple chronic bronchitis: Secondary | ICD-10-CM | POA: Diagnosis not present

## 2016-08-12 DIAGNOSIS — I10 Essential (primary) hypertension: Secondary | ICD-10-CM | POA: Diagnosis not present

## 2016-08-13 DIAGNOSIS — H0012 Chalazion right lower eyelid: Secondary | ICD-10-CM | POA: Diagnosis not present

## 2016-08-16 DIAGNOSIS — L040 Acute lymphadenitis of face, head and neck: Secondary | ICD-10-CM | POA: Diagnosis not present

## 2016-09-03 ENCOUNTER — Telehealth: Payer: Self-pay | Admitting: Internal Medicine

## 2016-09-03 NOTE — Telephone Encounter (Signed)
Spoke with [name], Pt needs CPAP titration study to ensure that pressure is set adequately and if there are any O2 needs at this time. Would the CPAP Titration show if the patient needs O2? Pt has been with the [facility] previously - does not have DME currently.  [name] aware that we will call her back as soon as we have more information from Dr Melvyn Novas.   Please advise Dr Melvyn Novas. Thanks.

## 2016-09-03 NOTE — Telephone Encounter (Signed)
I have nothing to do with his sleep disorder/ cpap and he should contact whover wrote him for it just like he would if someone had prescribed a medication  If he wants [place] to do the evaluation (ie start over) he'll need a cpap titration then determine 02 needs and be referred to one of our sleep docs - we can order the study and get the ball rolling since there is a waiting list for sleep

## 2016-09-03 NOTE — Telephone Encounter (Signed)
LM for Trevonda to return our call.  Pt needs OV with Sleep MD first available.  Order for CPAP Titration needs to be ordered.

## 2016-09-03 NOTE — Telephone Encounter (Signed)
[name] called back and scheduled appt with Dr. Halford Chessman for sleep consult on 10/29/2016.

## 2016-09-23 DIAGNOSIS — I10 Essential (primary) hypertension: Secondary | ICD-10-CM | POA: Diagnosis not present

## 2016-09-23 DIAGNOSIS — I2510 Atherosclerotic heart disease of native coronary artery without angina pectoris: Secondary | ICD-10-CM | POA: Insufficient documentation

## 2016-09-23 DIAGNOSIS — I509 Heart failure, unspecified: Secondary | ICD-10-CM | POA: Diagnosis not present

## 2016-09-23 DIAGNOSIS — Z6833 Body mass index (BMI) 33.0-33.9, adult: Secondary | ICD-10-CM | POA: Diagnosis not present

## 2016-09-23 DIAGNOSIS — J449 Chronic obstructive pulmonary disease, unspecified: Secondary | ICD-10-CM | POA: Diagnosis not present

## 2016-09-23 DIAGNOSIS — I25118 Atherosclerotic heart disease of native coronary artery with other forms of angina pectoris: Secondary | ICD-10-CM | POA: Insufficient documentation

## 2016-09-27 DIAGNOSIS — I2510 Atherosclerotic heart disease of native coronary artery without angina pectoris: Secondary | ICD-10-CM | POA: Diagnosis not present

## 2016-09-27 DIAGNOSIS — J449 Chronic obstructive pulmonary disease, unspecified: Secondary | ICD-10-CM | POA: Diagnosis not present

## 2016-09-27 DIAGNOSIS — I509 Heart failure, unspecified: Secondary | ICD-10-CM | POA: Diagnosis not present

## 2016-09-27 DIAGNOSIS — R079 Chest pain, unspecified: Secondary | ICD-10-CM | POA: Diagnosis not present

## 2016-10-02 DIAGNOSIS — Z09 Encounter for follow-up examination after completed treatment for conditions other than malignant neoplasm: Secondary | ICD-10-CM | POA: Diagnosis not present

## 2016-10-02 DIAGNOSIS — Z87891 Personal history of nicotine dependence: Secondary | ICD-10-CM | POA: Diagnosis not present

## 2016-10-02 DIAGNOSIS — R0602 Shortness of breath: Secondary | ICD-10-CM | POA: Diagnosis not present

## 2016-10-02 DIAGNOSIS — R079 Chest pain, unspecified: Secondary | ICD-10-CM | POA: Diagnosis not present

## 2016-10-02 DIAGNOSIS — J441 Chronic obstructive pulmonary disease with (acute) exacerbation: Secondary | ICD-10-CM | POA: Diagnosis not present

## 2016-10-02 DIAGNOSIS — R05 Cough: Secondary | ICD-10-CM | POA: Diagnosis not present

## 2016-10-08 DIAGNOSIS — J441 Chronic obstructive pulmonary disease with (acute) exacerbation: Secondary | ICD-10-CM | POA: Diagnosis not present

## 2016-10-17 DIAGNOSIS — M75120 Complete rotator cuff tear or rupture of unspecified shoulder, not specified as traumatic: Secondary | ICD-10-CM | POA: Diagnosis not present

## 2016-10-21 DIAGNOSIS — I2510 Atherosclerotic heart disease of native coronary artery without angina pectoris: Secondary | ICD-10-CM | POA: Diagnosis not present

## 2016-10-21 DIAGNOSIS — J449 Chronic obstructive pulmonary disease, unspecified: Secondary | ICD-10-CM | POA: Diagnosis not present

## 2016-10-21 DIAGNOSIS — Z6833 Body mass index (BMI) 33.0-33.9, adult: Secondary | ICD-10-CM | POA: Diagnosis not present

## 2016-10-21 DIAGNOSIS — I10 Essential (primary) hypertension: Secondary | ICD-10-CM | POA: Diagnosis not present

## 2016-10-29 ENCOUNTER — Institutional Professional Consult (permissible substitution): Payer: Medicare Other | Admitting: Pulmonary Disease

## 2016-12-06 DIAGNOSIS — M79609 Pain in unspecified limb: Secondary | ICD-10-CM | POA: Diagnosis not present

## 2016-12-06 DIAGNOSIS — I8011 Phlebitis and thrombophlebitis of right femoral vein: Secondary | ICD-10-CM | POA: Diagnosis not present

## 2016-12-06 DIAGNOSIS — I82401 Acute embolism and thrombosis of unspecified deep veins of right lower extremity: Secondary | ICD-10-CM | POA: Diagnosis not present

## 2016-12-06 DIAGNOSIS — M7989 Other specified soft tissue disorders: Secondary | ICD-10-CM | POA: Diagnosis not present

## 2016-12-23 DIAGNOSIS — K56609 Unspecified intestinal obstruction, unspecified as to partial versus complete obstruction: Secondary | ICD-10-CM

## 2016-12-23 HISTORY — PX: CHOLECYSTECTOMY: SHX55

## 2016-12-23 HISTORY — PX: HERNIA REPAIR: SHX51

## 2016-12-23 HISTORY — DX: Unspecified intestinal obstruction, unspecified as to partial versus complete obstruction: K56.609

## 2017-01-03 DIAGNOSIS — J441 Chronic obstructive pulmonary disease with (acute) exacerbation: Secondary | ICD-10-CM | POA: Diagnosis not present

## 2017-01-03 DIAGNOSIS — R600 Localized edema: Secondary | ICD-10-CM | POA: Diagnosis not present

## 2017-01-03 DIAGNOSIS — R202 Paresthesia of skin: Secondary | ICD-10-CM | POA: Diagnosis not present

## 2017-01-03 DIAGNOSIS — K219 Gastro-esophageal reflux disease without esophagitis: Secondary | ICD-10-CM | POA: Diagnosis not present

## 2017-01-03 DIAGNOSIS — R7301 Impaired fasting glucose: Secondary | ICD-10-CM | POA: Diagnosis not present

## 2017-01-03 DIAGNOSIS — R498 Other voice and resonance disorders: Secondary | ICD-10-CM | POA: Diagnosis not present

## 2017-01-23 DIAGNOSIS — M5416 Radiculopathy, lumbar region: Secondary | ICD-10-CM | POA: Diagnosis not present

## 2017-01-27 DIAGNOSIS — E784 Other hyperlipidemia: Secondary | ICD-10-CM | POA: Diagnosis not present

## 2017-01-27 DIAGNOSIS — R945 Abnormal results of liver function studies: Secondary | ICD-10-CM | POA: Diagnosis not present

## 2017-01-27 DIAGNOSIS — R740 Nonspecific elevation of levels of transaminase and lactic acid dehydrogenase [LDH]: Secondary | ICD-10-CM | POA: Diagnosis not present

## 2017-01-28 DIAGNOSIS — E782 Mixed hyperlipidemia: Secondary | ICD-10-CM | POA: Diagnosis not present

## 2017-01-28 DIAGNOSIS — R0789 Other chest pain: Secondary | ICD-10-CM | POA: Diagnosis not present

## 2017-01-28 DIAGNOSIS — R7301 Impaired fasting glucose: Secondary | ICD-10-CM | POA: Diagnosis not present

## 2017-01-28 DIAGNOSIS — I10 Essential (primary) hypertension: Secondary | ICD-10-CM | POA: Diagnosis not present

## 2017-01-28 DIAGNOSIS — M5126 Other intervertebral disc displacement, lumbar region: Secondary | ICD-10-CM | POA: Diagnosis not present

## 2017-01-28 DIAGNOSIS — R601 Generalized edema: Secondary | ICD-10-CM | POA: Diagnosis not present

## 2017-01-30 DIAGNOSIS — E782 Mixed hyperlipidemia: Secondary | ICD-10-CM | POA: Diagnosis not present

## 2017-01-30 DIAGNOSIS — R7301 Impaired fasting glucose: Secondary | ICD-10-CM | POA: Diagnosis not present

## 2017-01-30 DIAGNOSIS — M545 Low back pain: Secondary | ICD-10-CM | POA: Diagnosis not present

## 2017-01-30 DIAGNOSIS — M5416 Radiculopathy, lumbar region: Secondary | ICD-10-CM | POA: Diagnosis not present

## 2017-02-02 DIAGNOSIS — J101 Influenza due to other identified influenza virus with other respiratory manifestations: Secondary | ICD-10-CM | POA: Diagnosis not present

## 2017-02-02 DIAGNOSIS — R05 Cough: Secondary | ICD-10-CM | POA: Diagnosis not present

## 2017-02-02 DIAGNOSIS — J09X2 Influenza due to identified novel influenza A virus with other respiratory manifestations: Secondary | ICD-10-CM | POA: Diagnosis not present

## 2017-02-02 DIAGNOSIS — R0602 Shortness of breath: Secondary | ICD-10-CM | POA: Diagnosis not present

## 2017-02-11 DIAGNOSIS — J111 Influenza due to unidentified influenza virus with other respiratory manifestations: Secondary | ICD-10-CM | POA: Diagnosis not present

## 2017-02-11 DIAGNOSIS — R740 Nonspecific elevation of levels of transaminase and lactic acid dehydrogenase [LDH]: Secondary | ICD-10-CM | POA: Diagnosis not present

## 2017-02-14 DIAGNOSIS — M48061 Spinal stenosis, lumbar region without neurogenic claudication: Secondary | ICD-10-CM | POA: Diagnosis not present

## 2017-02-14 DIAGNOSIS — Z981 Arthrodesis status: Secondary | ICD-10-CM | POA: Diagnosis not present

## 2017-02-14 DIAGNOSIS — M47816 Spondylosis without myelopathy or radiculopathy, lumbar region: Secondary | ICD-10-CM | POA: Diagnosis not present

## 2017-02-14 DIAGNOSIS — Z9889 Other specified postprocedural states: Secondary | ICD-10-CM | POA: Diagnosis not present

## 2017-02-14 DIAGNOSIS — Z6832 Body mass index (BMI) 32.0-32.9, adult: Secondary | ICD-10-CM | POA: Diagnosis not present

## 2017-02-14 DIAGNOSIS — G589 Mononeuropathy, unspecified: Secondary | ICD-10-CM | POA: Diagnosis not present

## 2017-02-17 DIAGNOSIS — H04123 Dry eye syndrome of bilateral lacrimal glands: Secondary | ICD-10-CM | POA: Diagnosis not present

## 2017-02-17 DIAGNOSIS — H26493 Other secondary cataract, bilateral: Secondary | ICD-10-CM | POA: Diagnosis not present

## 2017-02-19 DIAGNOSIS — R740 Nonspecific elevation of levels of transaminase and lactic acid dehydrogenase [LDH]: Secondary | ICD-10-CM | POA: Diagnosis not present

## 2017-02-19 DIAGNOSIS — K8020 Calculus of gallbladder without cholecystitis without obstruction: Secondary | ICD-10-CM | POA: Diagnosis not present

## 2017-02-19 DIAGNOSIS — R7989 Other specified abnormal findings of blood chemistry: Secondary | ICD-10-CM | POA: Diagnosis not present

## 2017-02-19 DIAGNOSIS — R945 Abnormal results of liver function studies: Secondary | ICD-10-CM | POA: Diagnosis not present

## 2017-02-27 DIAGNOSIS — K8010 Calculus of gallbladder with chronic cholecystitis without obstruction: Secondary | ICD-10-CM | POA: Diagnosis not present

## 2017-03-04 DIAGNOSIS — M48061 Spinal stenosis, lumbar region without neurogenic claudication: Secondary | ICD-10-CM | POA: Diagnosis not present

## 2017-03-18 DIAGNOSIS — M48061 Spinal stenosis, lumbar region without neurogenic claudication: Secondary | ICD-10-CM | POA: Diagnosis not present

## 2017-03-18 DIAGNOSIS — Z6832 Body mass index (BMI) 32.0-32.9, adult: Secondary | ICD-10-CM | POA: Diagnosis not present

## 2017-03-18 DIAGNOSIS — Z9889 Other specified postprocedural states: Secondary | ICD-10-CM | POA: Diagnosis not present

## 2017-03-18 DIAGNOSIS — Z981 Arthrodesis status: Secondary | ICD-10-CM | POA: Diagnosis not present

## 2017-03-24 DIAGNOSIS — K8010 Calculus of gallbladder with chronic cholecystitis without obstruction: Secondary | ICD-10-CM | POA: Diagnosis not present

## 2017-03-24 DIAGNOSIS — I10 Essential (primary) hypertension: Secondary | ICD-10-CM | POA: Diagnosis not present

## 2017-03-24 DIAGNOSIS — J449 Chronic obstructive pulmonary disease, unspecified: Secondary | ICD-10-CM | POA: Diagnosis not present

## 2017-03-24 DIAGNOSIS — Z8719 Personal history of other diseases of the digestive system: Secondary | ICD-10-CM | POA: Diagnosis not present

## 2017-03-25 DIAGNOSIS — J449 Chronic obstructive pulmonary disease, unspecified: Secondary | ICD-10-CM | POA: Diagnosis present

## 2017-03-25 DIAGNOSIS — Z79899 Other long term (current) drug therapy: Secondary | ICD-10-CM | POA: Diagnosis not present

## 2017-03-25 DIAGNOSIS — I10 Essential (primary) hypertension: Secondary | ICD-10-CM | POA: Diagnosis present

## 2017-03-25 DIAGNOSIS — E7800 Pure hypercholesterolemia, unspecified: Secondary | ICD-10-CM | POA: Diagnosis present

## 2017-03-25 DIAGNOSIS — Z9989 Dependence on other enabling machines and devices: Secondary | ICD-10-CM | POA: Diagnosis not present

## 2017-03-25 DIAGNOSIS — T8131XA Disruption of external operation (surgical) wound, not elsewhere classified, initial encounter: Secondary | ICD-10-CM | POA: Diagnosis not present

## 2017-03-25 DIAGNOSIS — K8010 Calculus of gallbladder with chronic cholecystitis without obstruction: Secondary | ICD-10-CM | POA: Diagnosis present

## 2017-03-25 DIAGNOSIS — G4733 Obstructive sleep apnea (adult) (pediatric): Secondary | ICD-10-CM | POA: Diagnosis present

## 2017-03-25 DIAGNOSIS — K219 Gastro-esophageal reflux disease without esophagitis: Secondary | ICD-10-CM | POA: Diagnosis present

## 2017-03-25 DIAGNOSIS — Z87891 Personal history of nicotine dependence: Secondary | ICD-10-CM | POA: Diagnosis not present

## 2017-03-25 DIAGNOSIS — Z9981 Dependence on supplemental oxygen: Secondary | ICD-10-CM | POA: Diagnosis not present

## 2017-03-25 DIAGNOSIS — K432 Incisional hernia without obstruction or gangrene: Secondary | ICD-10-CM | POA: Diagnosis present

## 2017-03-25 DIAGNOSIS — T8130XA Disruption of wound, unspecified, initial encounter: Secondary | ICD-10-CM | POA: Diagnosis not present

## 2017-03-26 IMAGING — DX DG CHEST 2V
2 series · 2 of 2 positions shown · non-contrast
Comparison: 01/05/2016

CLINICAL DATA: COPD.  Tightness of the chest.

EXAM:
CHEST  2 VIEW

[chest pa]
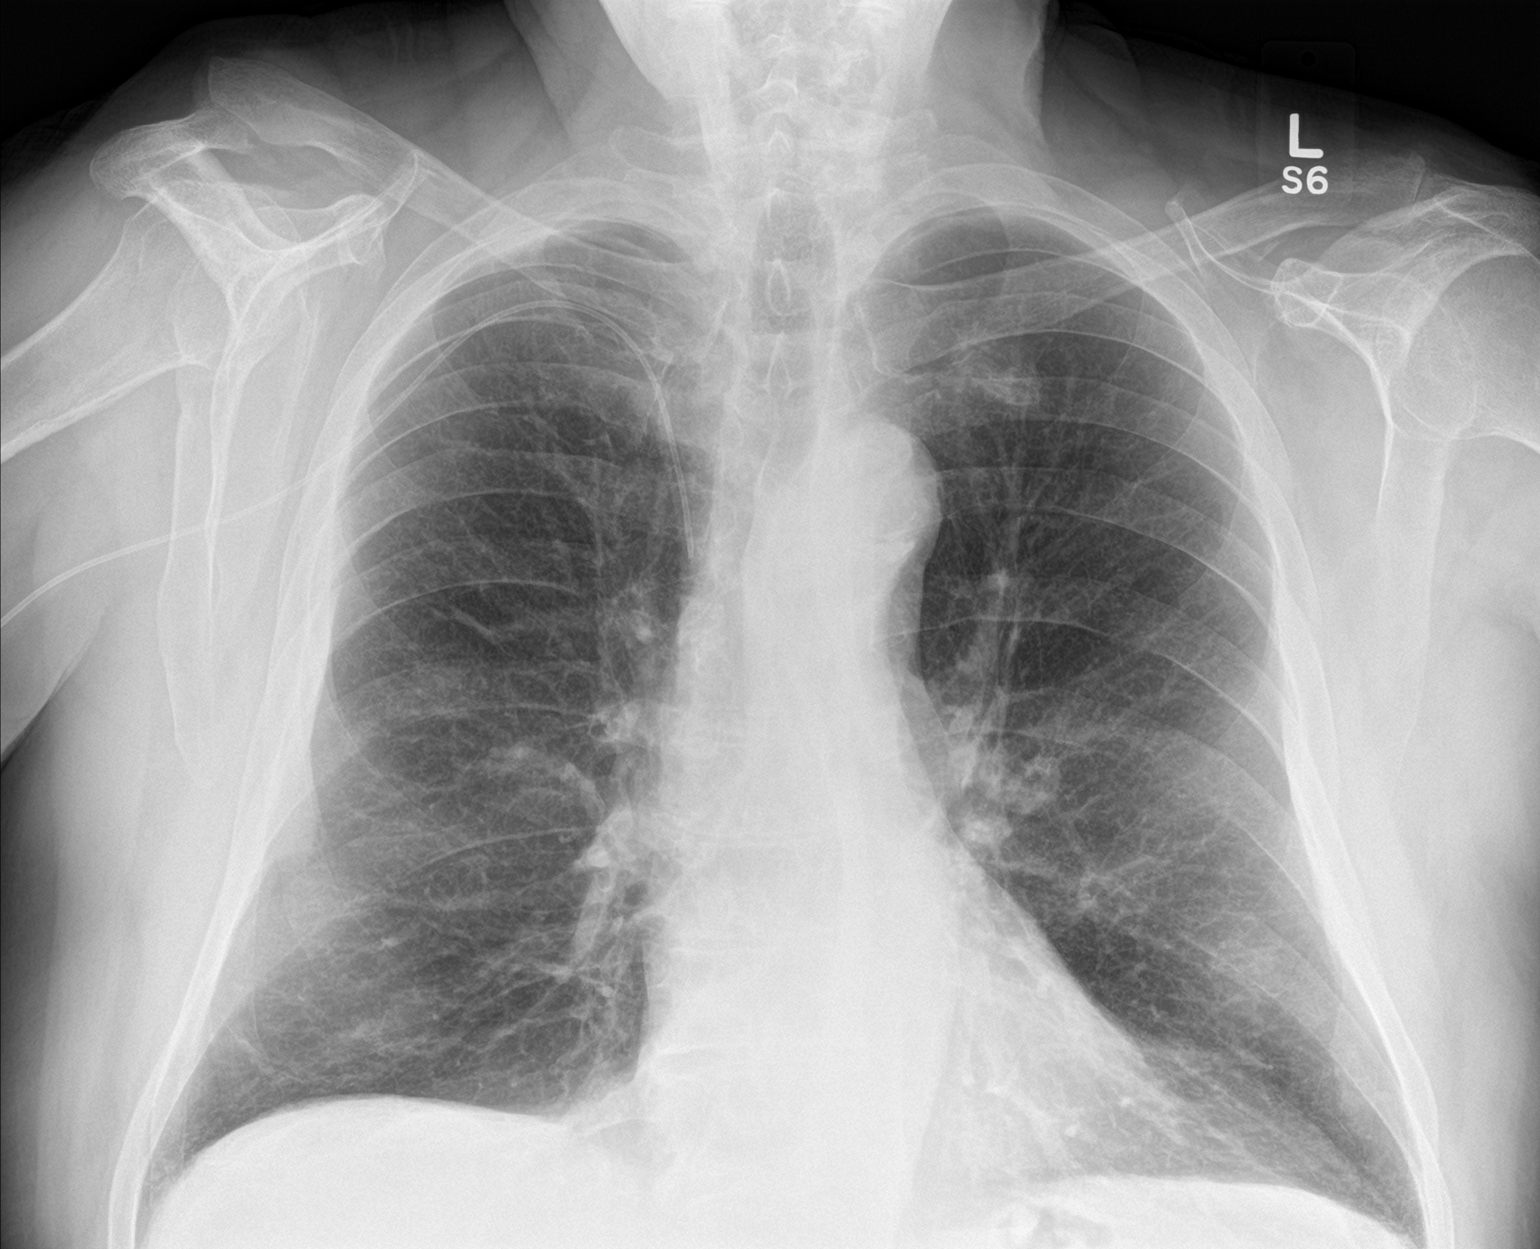

[chest lat]
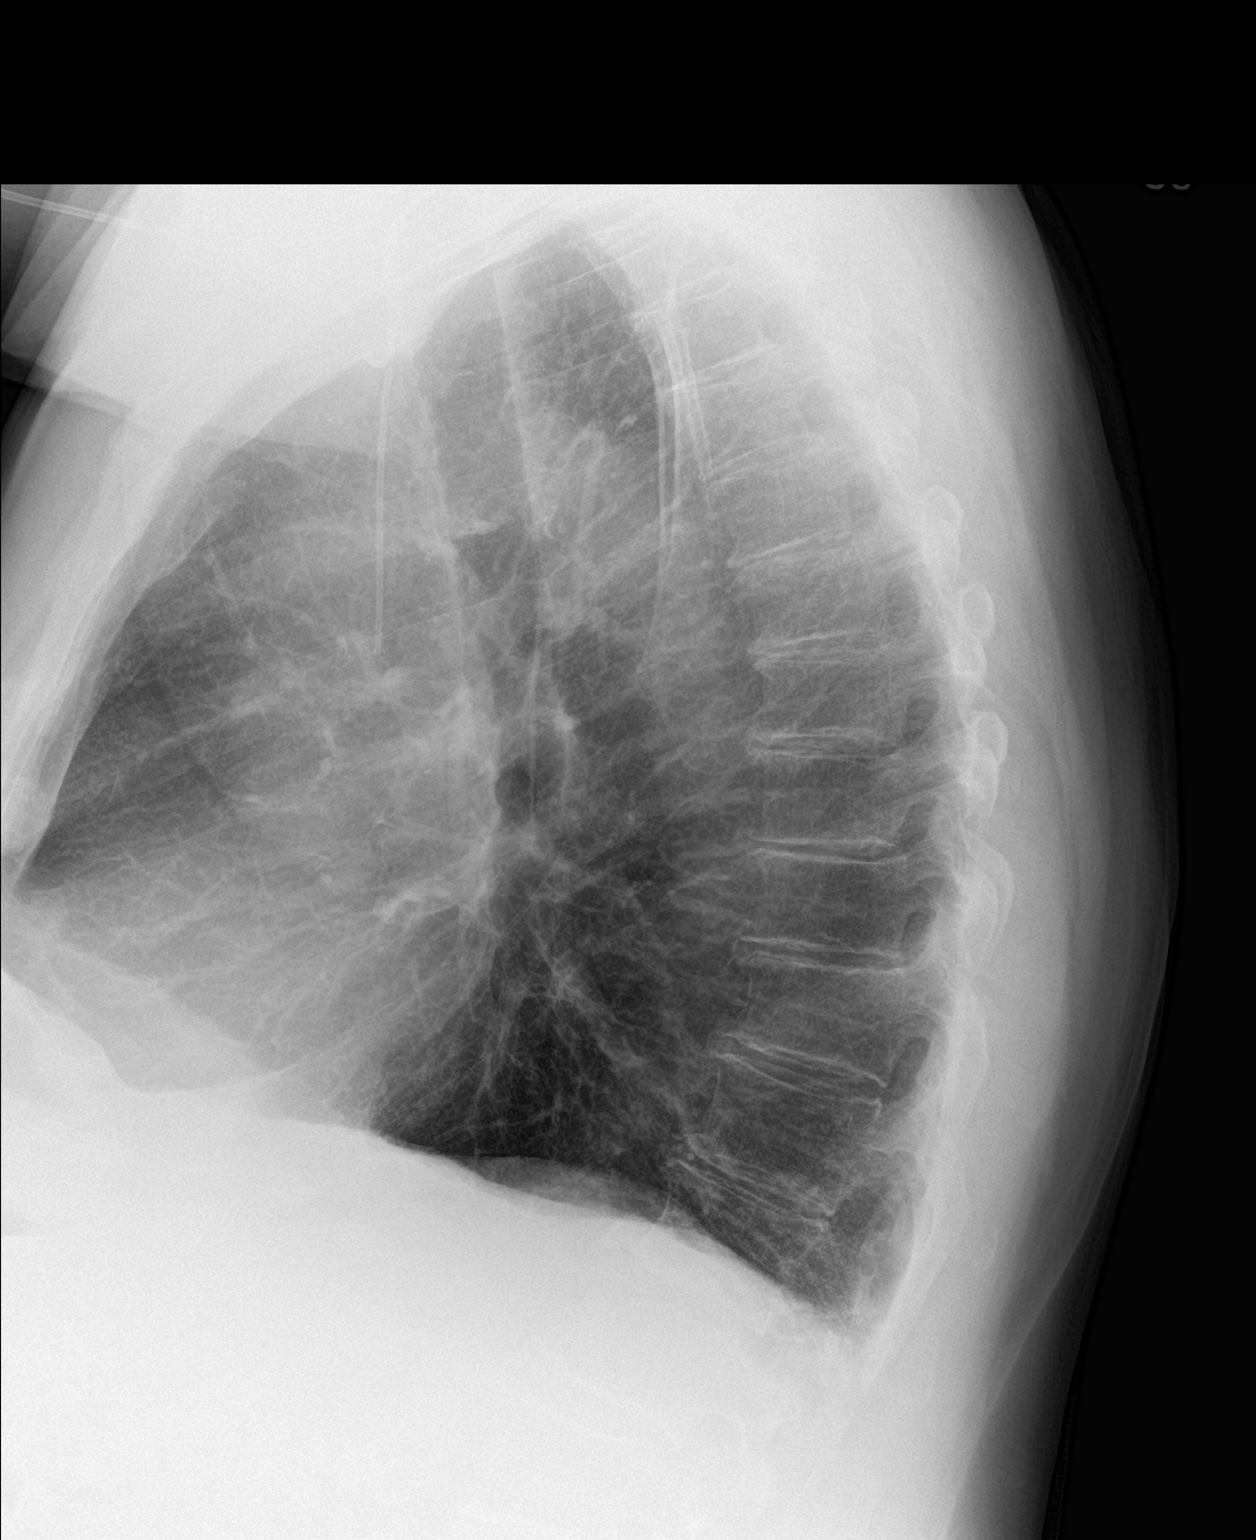

[2 of 2 positions shown; findings below may reference images not displayed]

FINDINGS: There is a right-sided PICC line with the tip projecting over the
SVC.

The lungs are hyperinflated likely secondary to COPD. There is no
focal parenchymal opacity. There is no pleural effusion or
pneumothorax. The heart and mediastinal contours are unremarkable.

There is mild thoracic spine spondylosis.
IMPRESSION: No active cardiopulmonary disease.

## 2017-04-01 DIAGNOSIS — M48061 Spinal stenosis, lumbar region without neurogenic claudication: Secondary | ICD-10-CM | POA: Diagnosis not present

## 2017-04-01 DIAGNOSIS — Z6831 Body mass index (BMI) 31.0-31.9, adult: Secondary | ICD-10-CM | POA: Diagnosis not present

## 2017-04-01 DIAGNOSIS — Z981 Arthrodesis status: Secondary | ICD-10-CM | POA: Diagnosis not present

## 2017-04-01 DIAGNOSIS — J449 Chronic obstructive pulmonary disease, unspecified: Secondary | ICD-10-CM | POA: Diagnosis not present

## 2017-04-01 DIAGNOSIS — Z9889 Other specified postprocedural states: Secondary | ICD-10-CM | POA: Diagnosis not present

## 2017-04-08 DIAGNOSIS — Z09 Encounter for follow-up examination after completed treatment for conditions other than malignant neoplasm: Secondary | ICD-10-CM | POA: Diagnosis not present

## 2017-04-08 DIAGNOSIS — T8131XA Disruption of external operation (surgical) wound, not elsewhere classified, initial encounter: Secondary | ICD-10-CM | POA: Diagnosis not present

## 2017-04-21 DIAGNOSIS — I700 Atherosclerosis of aorta: Secondary | ICD-10-CM | POA: Diagnosis not present

## 2017-04-21 DIAGNOSIS — Z01818 Encounter for other preprocedural examination: Secondary | ICD-10-CM | POA: Diagnosis not present

## 2017-04-21 DIAGNOSIS — Z9981 Dependence on supplemental oxygen: Secondary | ICD-10-CM | POA: Insufficient documentation

## 2017-04-21 DIAGNOSIS — M48061 Spinal stenosis, lumbar region without neurogenic claudication: Secondary | ICD-10-CM | POA: Diagnosis not present

## 2017-04-24 DIAGNOSIS — K219 Gastro-esophageal reflux disease without esophagitis: Secondary | ICD-10-CM | POA: Diagnosis not present

## 2017-04-24 DIAGNOSIS — M48061 Spinal stenosis, lumbar region without neurogenic claudication: Secondary | ICD-10-CM | POA: Diagnosis not present

## 2017-04-24 DIAGNOSIS — M48062 Spinal stenosis, lumbar region with neurogenic claudication: Secondary | ICD-10-CM | POA: Insufficient documentation

## 2017-04-24 DIAGNOSIS — I10 Essential (primary) hypertension: Secondary | ICD-10-CM | POA: Diagnosis not present

## 2017-04-24 DIAGNOSIS — Z9889 Other specified postprocedural states: Secondary | ICD-10-CM | POA: Diagnosis not present

## 2017-04-24 DIAGNOSIS — L905 Scar conditions and fibrosis of skin: Secondary | ICD-10-CM | POA: Diagnosis not present

## 2017-04-24 DIAGNOSIS — E785 Hyperlipidemia, unspecified: Secondary | ICD-10-CM | POA: Diagnosis not present

## 2017-04-24 DIAGNOSIS — Z79899 Other long term (current) drug therapy: Secondary | ICD-10-CM | POA: Diagnosis not present

## 2017-04-24 DIAGNOSIS — Z9981 Dependence on supplemental oxygen: Secondary | ICD-10-CM | POA: Diagnosis not present

## 2017-04-24 DIAGNOSIS — I2510 Atherosclerotic heart disease of native coronary artery without angina pectoris: Secondary | ICD-10-CM | POA: Diagnosis not present

## 2017-04-24 DIAGNOSIS — Z7982 Long term (current) use of aspirin: Secondary | ICD-10-CM | POA: Diagnosis not present

## 2017-04-24 DIAGNOSIS — Z9049 Acquired absence of other specified parts of digestive tract: Secondary | ICD-10-CM | POA: Diagnosis not present

## 2017-04-24 DIAGNOSIS — E669 Obesity, unspecified: Secondary | ICD-10-CM | POA: Diagnosis not present

## 2017-04-24 DIAGNOSIS — J449 Chronic obstructive pulmonary disease, unspecified: Secondary | ICD-10-CM | POA: Diagnosis not present

## 2017-04-24 DIAGNOSIS — Z981 Arthrodesis status: Secondary | ICD-10-CM | POA: Diagnosis not present

## 2017-04-24 DIAGNOSIS — Z6839 Body mass index (BMI) 39.0-39.9, adult: Secondary | ICD-10-CM | POA: Diagnosis not present

## 2017-04-24 HISTORY — DX: Spinal stenosis, lumbar region with neurogenic claudication: M48.062

## 2017-04-25 DIAGNOSIS — I2510 Atherosclerotic heart disease of native coronary artery without angina pectoris: Secondary | ICD-10-CM | POA: Diagnosis not present

## 2017-04-25 DIAGNOSIS — M48062 Spinal stenosis, lumbar region with neurogenic claudication: Secondary | ICD-10-CM | POA: Diagnosis not present

## 2017-04-25 DIAGNOSIS — Z9981 Dependence on supplemental oxygen: Secondary | ICD-10-CM | POA: Diagnosis not present

## 2017-04-25 DIAGNOSIS — L905 Scar conditions and fibrosis of skin: Secondary | ICD-10-CM | POA: Diagnosis not present

## 2017-04-25 DIAGNOSIS — J449 Chronic obstructive pulmonary disease, unspecified: Secondary | ICD-10-CM | POA: Diagnosis not present

## 2017-04-25 DIAGNOSIS — I10 Essential (primary) hypertension: Secondary | ICD-10-CM | POA: Diagnosis not present

## 2017-05-14 DIAGNOSIS — R7301 Impaired fasting glucose: Secondary | ICD-10-CM | POA: Diagnosis not present

## 2017-05-14 DIAGNOSIS — I10 Essential (primary) hypertension: Secondary | ICD-10-CM | POA: Diagnosis not present

## 2017-05-15 DIAGNOSIS — R7301 Impaired fasting glucose: Secondary | ICD-10-CM | POA: Diagnosis not present

## 2017-05-15 DIAGNOSIS — I10 Essential (primary) hypertension: Secondary | ICD-10-CM | POA: Diagnosis not present

## 2017-05-15 DIAGNOSIS — E782 Mixed hyperlipidemia: Secondary | ICD-10-CM | POA: Diagnosis not present

## 2017-05-15 DIAGNOSIS — R740 Nonspecific elevation of levels of transaminase and lactic acid dehydrogenase [LDH]: Secondary | ICD-10-CM | POA: Diagnosis not present

## 2017-05-20 DIAGNOSIS — D225 Melanocytic nevi of trunk: Secondary | ICD-10-CM | POA: Diagnosis not present

## 2017-05-20 DIAGNOSIS — L570 Actinic keratosis: Secondary | ICD-10-CM | POA: Diagnosis not present

## 2017-05-20 DIAGNOSIS — L821 Other seborrheic keratosis: Secondary | ICD-10-CM | POA: Diagnosis not present

## 2017-07-14 ENCOUNTER — Other Ambulatory Visit: Payer: Self-pay

## 2017-07-23 DIAGNOSIS — K5730 Diverticulosis of large intestine without perforation or abscess without bleeding: Secondary | ICD-10-CM | POA: Diagnosis not present

## 2017-07-23 DIAGNOSIS — R109 Unspecified abdominal pain: Secondary | ICD-10-CM | POA: Diagnosis not present

## 2017-07-23 DIAGNOSIS — K625 Hemorrhage of anus and rectum: Secondary | ICD-10-CM | POA: Diagnosis not present

## 2017-07-24 DIAGNOSIS — K432 Incisional hernia without obstruction or gangrene: Secondary | ICD-10-CM | POA: Diagnosis not present

## 2017-07-28 DIAGNOSIS — R29898 Other symptoms and signs involving the musculoskeletal system: Secondary | ICD-10-CM | POA: Diagnosis not present

## 2017-07-28 DIAGNOSIS — M7742 Metatarsalgia, left foot: Secondary | ICD-10-CM | POA: Diagnosis not present

## 2017-07-28 DIAGNOSIS — L84 Corns and callosities: Secondary | ICD-10-CM | POA: Diagnosis not present

## 2017-07-30 DIAGNOSIS — K921 Melena: Secondary | ICD-10-CM | POA: Diagnosis not present

## 2017-07-30 DIAGNOSIS — K219 Gastro-esophageal reflux disease without esophagitis: Secondary | ICD-10-CM | POA: Diagnosis not present

## 2017-08-05 DIAGNOSIS — K921 Melena: Secondary | ICD-10-CM | POA: Diagnosis not present

## 2017-08-05 DIAGNOSIS — D126 Benign neoplasm of colon, unspecified: Secondary | ICD-10-CM | POA: Diagnosis not present

## 2017-08-05 DIAGNOSIS — Z9049 Acquired absence of other specified parts of digestive tract: Secondary | ICD-10-CM | POA: Diagnosis not present

## 2017-08-05 DIAGNOSIS — K5730 Diverticulosis of large intestine without perforation or abscess without bleeding: Secondary | ICD-10-CM | POA: Diagnosis not present

## 2017-08-05 DIAGNOSIS — R935 Abnormal findings on diagnostic imaging of other abdominal regions, including retroperitoneum: Secondary | ICD-10-CM | POA: Diagnosis not present

## 2017-08-05 DIAGNOSIS — Z79899 Other long term (current) drug therapy: Secondary | ICD-10-CM | POA: Diagnosis not present

## 2017-08-05 DIAGNOSIS — R945 Abnormal results of liver function studies: Secondary | ICD-10-CM | POA: Diagnosis not present

## 2017-08-05 DIAGNOSIS — I10 Essential (primary) hypertension: Secondary | ICD-10-CM | POA: Diagnosis not present

## 2017-08-05 DIAGNOSIS — D122 Benign neoplasm of ascending colon: Secondary | ICD-10-CM | POA: Diagnosis not present

## 2017-08-05 DIAGNOSIS — K648 Other hemorrhoids: Secondary | ICD-10-CM | POA: Diagnosis not present

## 2017-08-14 DIAGNOSIS — E782 Mixed hyperlipidemia: Secondary | ICD-10-CM | POA: Diagnosis not present

## 2017-08-14 DIAGNOSIS — R7301 Impaired fasting glucose: Secondary | ICD-10-CM | POA: Diagnosis not present

## 2017-08-14 DIAGNOSIS — G248 Other dystonia: Secondary | ICD-10-CM | POA: Diagnosis not present

## 2017-08-14 DIAGNOSIS — M791 Myalgia: Secondary | ICD-10-CM | POA: Diagnosis not present

## 2017-08-14 DIAGNOSIS — I10 Essential (primary) hypertension: Secondary | ICD-10-CM | POA: Diagnosis not present

## 2017-08-14 DIAGNOSIS — R740 Nonspecific elevation of levels of transaminase and lactic acid dehydrogenase [LDH]: Secondary | ICD-10-CM | POA: Diagnosis not present

## 2017-08-19 DIAGNOSIS — J449 Chronic obstructive pulmonary disease, unspecified: Secondary | ICD-10-CM | POA: Diagnosis not present

## 2017-08-19 DIAGNOSIS — G4733 Obstructive sleep apnea (adult) (pediatric): Secondary | ICD-10-CM | POA: Diagnosis not present

## 2017-08-19 DIAGNOSIS — Z79899 Other long term (current) drug therapy: Secondary | ICD-10-CM | POA: Diagnosis not present

## 2017-08-19 DIAGNOSIS — K432 Incisional hernia without obstruction or gangrene: Secondary | ICD-10-CM | POA: Diagnosis not present

## 2017-08-19 DIAGNOSIS — K430 Incisional hernia with obstruction, without gangrene: Secondary | ICD-10-CM | POA: Diagnosis not present

## 2017-08-19 DIAGNOSIS — I10 Essential (primary) hypertension: Secondary | ICD-10-CM | POA: Diagnosis not present

## 2017-08-19 DIAGNOSIS — R0602 Shortness of breath: Secondary | ICD-10-CM | POA: Diagnosis not present

## 2017-08-20 DIAGNOSIS — J449 Chronic obstructive pulmonary disease, unspecified: Secondary | ICD-10-CM | POA: Diagnosis not present

## 2017-08-20 DIAGNOSIS — K432 Incisional hernia without obstruction or gangrene: Secondary | ICD-10-CM | POA: Diagnosis not present

## 2017-08-20 DIAGNOSIS — Z79899 Other long term (current) drug therapy: Secondary | ICD-10-CM | POA: Diagnosis not present

## 2017-08-20 DIAGNOSIS — I10 Essential (primary) hypertension: Secondary | ICD-10-CM | POA: Diagnosis not present

## 2017-08-20 DIAGNOSIS — G4733 Obstructive sleep apnea (adult) (pediatric): Secondary | ICD-10-CM | POA: Diagnosis not present

## 2017-08-20 DIAGNOSIS — R0602 Shortness of breath: Secondary | ICD-10-CM | POA: Diagnosis not present

## 2017-08-24 DIAGNOSIS — K56600 Partial intestinal obstruction, unspecified as to cause: Secondary | ICD-10-CM | POA: Diagnosis not present

## 2017-08-24 DIAGNOSIS — N200 Calculus of kidney: Secondary | ICD-10-CM | POA: Diagnosis not present

## 2017-08-24 DIAGNOSIS — D72829 Elevated white blood cell count, unspecified: Secondary | ICD-10-CM | POA: Diagnosis not present

## 2017-08-24 DIAGNOSIS — R10813 Right lower quadrant abdominal tenderness: Secondary | ICD-10-CM | POA: Diagnosis not present

## 2017-08-24 DIAGNOSIS — K2970 Gastritis, unspecified, without bleeding: Secondary | ICD-10-CM | POA: Diagnosis not present

## 2017-08-24 DIAGNOSIS — K760 Fatty (change of) liver, not elsewhere classified: Secondary | ICD-10-CM | POA: Diagnosis not present

## 2017-08-24 DIAGNOSIS — K5900 Constipation, unspecified: Secondary | ICD-10-CM | POA: Diagnosis not present

## 2017-08-26 DIAGNOSIS — K567 Ileus, unspecified: Secondary | ICD-10-CM | POA: Diagnosis not present

## 2017-08-26 DIAGNOSIS — J449 Chronic obstructive pulmonary disease, unspecified: Secondary | ICD-10-CM | POA: Diagnosis not present

## 2017-08-26 DIAGNOSIS — R140 Abdominal distension (gaseous): Secondary | ICD-10-CM | POA: Diagnosis not present

## 2017-08-26 DIAGNOSIS — R109 Unspecified abdominal pain: Secondary | ICD-10-CM | POA: Diagnosis not present

## 2017-08-26 DIAGNOSIS — J9611 Chronic respiratory failure with hypoxia: Secondary | ICD-10-CM | POA: Diagnosis not present

## 2017-08-26 DIAGNOSIS — L03311 Cellulitis of abdominal wall: Secondary | ICD-10-CM | POA: Diagnosis not present

## 2017-08-26 DIAGNOSIS — I10 Essential (primary) hypertension: Secondary | ICD-10-CM | POA: Diagnosis not present

## 2017-08-26 DIAGNOSIS — R112 Nausea with vomiting, unspecified: Secondary | ICD-10-CM | POA: Diagnosis not present

## 2017-08-26 DIAGNOSIS — K9189 Other postprocedural complications and disorders of digestive system: Secondary | ICD-10-CM | POA: Diagnosis not present

## 2017-08-27 DIAGNOSIS — E785 Hyperlipidemia, unspecified: Secondary | ICD-10-CM | POA: Diagnosis present

## 2017-08-27 DIAGNOSIS — Z79899 Other long term (current) drug therapy: Secondary | ICD-10-CM | POA: Diagnosis not present

## 2017-08-27 DIAGNOSIS — Z85828 Personal history of other malignant neoplasm of skin: Secondary | ICD-10-CM | POA: Diagnosis not present

## 2017-08-27 DIAGNOSIS — M549 Dorsalgia, unspecified: Secondary | ICD-10-CM | POA: Diagnosis present

## 2017-08-27 DIAGNOSIS — I10 Essential (primary) hypertension: Secondary | ICD-10-CM | POA: Diagnosis present

## 2017-08-27 DIAGNOSIS — L03311 Cellulitis of abdominal wall: Secondary | ICD-10-CM | POA: Diagnosis not present

## 2017-08-27 DIAGNOSIS — K219 Gastro-esophageal reflux disease without esophagitis: Secondary | ICD-10-CM | POA: Diagnosis present

## 2017-08-27 DIAGNOSIS — L84 Corns and callosities: Secondary | ICD-10-CM | POA: Diagnosis present

## 2017-08-27 DIAGNOSIS — Z87891 Personal history of nicotine dependence: Secondary | ICD-10-CM | POA: Diagnosis not present

## 2017-08-27 DIAGNOSIS — R140 Abdominal distension (gaseous): Secondary | ICD-10-CM | POA: Diagnosis not present

## 2017-08-27 DIAGNOSIS — Z9049 Acquired absence of other specified parts of digestive tract: Secondary | ICD-10-CM | POA: Diagnosis not present

## 2017-08-27 DIAGNOSIS — K9189 Other postprocedural complications and disorders of digestive system: Secondary | ICD-10-CM | POA: Diagnosis not present

## 2017-08-27 DIAGNOSIS — J9611 Chronic respiratory failure with hypoxia: Secondary | ICD-10-CM | POA: Diagnosis present

## 2017-08-27 DIAGNOSIS — Z888 Allergy status to other drugs, medicaments and biological substances status: Secondary | ICD-10-CM | POA: Diagnosis not present

## 2017-08-27 DIAGNOSIS — M1990 Unspecified osteoarthritis, unspecified site: Secondary | ICD-10-CM | POA: Diagnosis present

## 2017-08-27 DIAGNOSIS — K567 Ileus, unspecified: Secondary | ICD-10-CM | POA: Diagnosis not present

## 2017-08-27 DIAGNOSIS — Z880 Allergy status to penicillin: Secondary | ICD-10-CM | POA: Diagnosis not present

## 2017-08-27 DIAGNOSIS — R109 Unspecified abdominal pain: Secondary | ICD-10-CM | POA: Diagnosis not present

## 2017-08-27 DIAGNOSIS — J449 Chronic obstructive pulmonary disease, unspecified: Secondary | ICD-10-CM | POA: Diagnosis not present

## 2017-08-27 DIAGNOSIS — Z823 Family history of stroke: Secondary | ICD-10-CM | POA: Diagnosis not present

## 2017-08-27 DIAGNOSIS — G8929 Other chronic pain: Secondary | ICD-10-CM | POA: Diagnosis present

## 2017-09-02 DIAGNOSIS — K560 Paralytic ileus: Secondary | ICD-10-CM | POA: Diagnosis not present

## 2017-09-02 DIAGNOSIS — J9611 Chronic respiratory failure with hypoxia: Secondary | ICD-10-CM | POA: Insufficient documentation

## 2017-09-02 DIAGNOSIS — Z23 Encounter for immunization: Secondary | ICD-10-CM | POA: Diagnosis not present

## 2017-09-02 DIAGNOSIS — I700 Atherosclerosis of aorta: Secondary | ICD-10-CM | POA: Diagnosis not present

## 2017-09-03 DIAGNOSIS — K432 Incisional hernia without obstruction or gangrene: Secondary | ICD-10-CM | POA: Diagnosis not present

## 2017-09-17 DIAGNOSIS — K432 Incisional hernia without obstruction or gangrene: Secondary | ICD-10-CM | POA: Diagnosis not present

## 2017-09-17 DIAGNOSIS — K5730 Diverticulosis of large intestine without perforation or abscess without bleeding: Secondary | ICD-10-CM | POA: Diagnosis not present

## 2017-09-17 DIAGNOSIS — K648 Other hemorrhoids: Secondary | ICD-10-CM | POA: Diagnosis not present

## 2017-09-17 DIAGNOSIS — K921 Melena: Secondary | ICD-10-CM | POA: Diagnosis not present

## 2017-10-01 DIAGNOSIS — M79631 Pain in right forearm: Secondary | ICD-10-CM | POA: Diagnosis not present

## 2017-10-01 DIAGNOSIS — M79621 Pain in right upper arm: Secondary | ICD-10-CM | POA: Diagnosis not present

## 2017-10-01 DIAGNOSIS — M79662 Pain in left lower leg: Secondary | ICD-10-CM | POA: Diagnosis not present

## 2017-10-08 DIAGNOSIS — I743 Embolism and thrombosis of arteries of the lower extremities: Secondary | ICD-10-CM | POA: Diagnosis not present

## 2017-10-08 DIAGNOSIS — M79661 Pain in right lower leg: Secondary | ICD-10-CM | POA: Diagnosis not present

## 2017-10-08 DIAGNOSIS — M79662 Pain in left lower leg: Secondary | ICD-10-CM | POA: Diagnosis not present

## 2017-10-13 DIAGNOSIS — Z125 Encounter for screening for malignant neoplasm of prostate: Secondary | ICD-10-CM | POA: Diagnosis not present

## 2017-10-13 DIAGNOSIS — I10 Essential (primary) hypertension: Secondary | ICD-10-CM | POA: Diagnosis not present

## 2017-10-13 DIAGNOSIS — E782 Mixed hyperlipidemia: Secondary | ICD-10-CM | POA: Diagnosis not present

## 2017-10-14 DIAGNOSIS — Z6832 Body mass index (BMI) 32.0-32.9, adult: Secondary | ICD-10-CM | POA: Diagnosis not present

## 2017-10-14 DIAGNOSIS — I70211 Atherosclerosis of native arteries of extremities with intermittent claudication, right leg: Secondary | ICD-10-CM | POA: Diagnosis not present

## 2017-10-14 DIAGNOSIS — Z0001 Encounter for general adult medical examination with abnormal findings: Secondary | ICD-10-CM | POA: Diagnosis not present

## 2017-10-27 DIAGNOSIS — J060 Acute laryngopharyngitis: Secondary | ICD-10-CM | POA: Diagnosis not present

## 2017-10-27 DIAGNOSIS — J208 Acute bronchitis due to other specified organisms: Secondary | ICD-10-CM | POA: Diagnosis not present

## 2017-11-05 DIAGNOSIS — M79651 Pain in right thigh: Secondary | ICD-10-CM | POA: Diagnosis not present

## 2017-11-05 DIAGNOSIS — M79652 Pain in left thigh: Secondary | ICD-10-CM | POA: Diagnosis not present

## 2017-11-18 DIAGNOSIS — R740 Nonspecific elevation of levels of transaminase and lactic acid dehydrogenase [LDH]: Secondary | ICD-10-CM | POA: Diagnosis not present

## 2017-12-29 DIAGNOSIS — R454 Irritability and anger: Secondary | ICD-10-CM | POA: Diagnosis not present

## 2017-12-29 DIAGNOSIS — J018 Other acute sinusitis: Secondary | ICD-10-CM | POA: Diagnosis not present

## 2018-01-19 DIAGNOSIS — R7301 Impaired fasting glucose: Secondary | ICD-10-CM | POA: Diagnosis not present

## 2018-01-19 DIAGNOSIS — E782 Mixed hyperlipidemia: Secondary | ICD-10-CM | POA: Diagnosis not present

## 2018-01-19 DIAGNOSIS — I10 Essential (primary) hypertension: Secondary | ICD-10-CM | POA: Diagnosis not present

## 2018-01-20 DIAGNOSIS — K439 Ventral hernia without obstruction or gangrene: Secondary | ICD-10-CM | POA: Diagnosis not present

## 2018-01-20 DIAGNOSIS — R7301 Impaired fasting glucose: Secondary | ICD-10-CM | POA: Diagnosis not present

## 2018-01-20 DIAGNOSIS — I10 Essential (primary) hypertension: Secondary | ICD-10-CM | POA: Diagnosis not present

## 2018-01-20 DIAGNOSIS — F5101 Primary insomnia: Secondary | ICD-10-CM | POA: Diagnosis not present

## 2018-01-20 DIAGNOSIS — E782 Mixed hyperlipidemia: Secondary | ICD-10-CM | POA: Diagnosis not present

## 2018-01-20 DIAGNOSIS — R454 Irritability and anger: Secondary | ICD-10-CM | POA: Diagnosis not present

## 2018-01-27 DIAGNOSIS — Z79899 Other long term (current) drug therapy: Secondary | ICD-10-CM | POA: Diagnosis not present

## 2018-01-27 DIAGNOSIS — I10 Essential (primary) hypertension: Secondary | ICD-10-CM | POA: Diagnosis not present

## 2018-01-27 DIAGNOSIS — R609 Edema, unspecified: Secondary | ICD-10-CM | POA: Diagnosis not present

## 2018-01-27 DIAGNOSIS — J449 Chronic obstructive pulmonary disease, unspecified: Secondary | ICD-10-CM | POA: Diagnosis not present

## 2018-01-27 DIAGNOSIS — R0789 Other chest pain: Secondary | ICD-10-CM | POA: Diagnosis not present

## 2018-01-27 DIAGNOSIS — Z87891 Personal history of nicotine dependence: Secondary | ICD-10-CM | POA: Diagnosis not present

## 2018-01-27 DIAGNOSIS — S299XXA Unspecified injury of thorax, initial encounter: Secondary | ICD-10-CM | POA: Diagnosis not present

## 2018-01-27 DIAGNOSIS — Z9181 History of falling: Secondary | ICD-10-CM | POA: Diagnosis not present

## 2018-01-27 DIAGNOSIS — R072 Precordial pain: Secondary | ICD-10-CM | POA: Diagnosis not present

## 2018-02-01 DIAGNOSIS — R0602 Shortness of breath: Secondary | ICD-10-CM | POA: Diagnosis not present

## 2018-02-01 DIAGNOSIS — I10 Essential (primary) hypertension: Secondary | ICD-10-CM | POA: Diagnosis not present

## 2018-02-01 DIAGNOSIS — E785 Hyperlipidemia, unspecified: Secondary | ICD-10-CM | POA: Diagnosis not present

## 2018-02-01 DIAGNOSIS — Z888 Allergy status to other drugs, medicaments and biological substances status: Secondary | ICD-10-CM | POA: Diagnosis not present

## 2018-02-01 DIAGNOSIS — J9811 Atelectasis: Secondary | ICD-10-CM | POA: Diagnosis not present

## 2018-02-01 DIAGNOSIS — R079 Chest pain, unspecified: Secondary | ICD-10-CM | POA: Diagnosis not present

## 2018-02-01 DIAGNOSIS — Y998 Other external cause status: Secondary | ICD-10-CM | POA: Diagnosis not present

## 2018-02-01 DIAGNOSIS — J439 Emphysema, unspecified: Secondary | ICD-10-CM | POA: Diagnosis not present

## 2018-02-01 DIAGNOSIS — Z87891 Personal history of nicotine dependence: Secondary | ICD-10-CM | POA: Diagnosis not present

## 2018-02-01 DIAGNOSIS — Z880 Allergy status to penicillin: Secondary | ICD-10-CM | POA: Diagnosis not present

## 2018-02-01 DIAGNOSIS — R0789 Other chest pain: Secondary | ICD-10-CM | POA: Diagnosis not present

## 2018-02-01 DIAGNOSIS — S20212D Contusion of left front wall of thorax, subsequent encounter: Secondary | ICD-10-CM | POA: Diagnosis not present

## 2018-02-01 DIAGNOSIS — D72829 Elevated white blood cell count, unspecified: Secondary | ICD-10-CM | POA: Diagnosis not present

## 2018-02-01 DIAGNOSIS — W010XXD Fall on same level from slipping, tripping and stumbling without subsequent striking against object, subsequent encounter: Secondary | ICD-10-CM | POA: Diagnosis not present

## 2018-02-01 DIAGNOSIS — R05 Cough: Secondary | ICD-10-CM | POA: Diagnosis not present

## 2018-02-02 DIAGNOSIS — R079 Chest pain, unspecified: Secondary | ICD-10-CM | POA: Diagnosis not present

## 2018-02-10 DIAGNOSIS — S20212A Contusion of left front wall of thorax, initial encounter: Secondary | ICD-10-CM | POA: Diagnosis not present

## 2018-02-10 DIAGNOSIS — R454 Irritability and anger: Secondary | ICD-10-CM | POA: Diagnosis not present

## 2018-02-10 DIAGNOSIS — W172XXA Fall into hole, initial encounter: Secondary | ICD-10-CM | POA: Diagnosis not present

## 2018-02-11 DIAGNOSIS — E669 Obesity, unspecified: Secondary | ICD-10-CM | POA: Diagnosis not present

## 2018-02-11 DIAGNOSIS — Z9049 Acquired absence of other specified parts of digestive tract: Secondary | ICD-10-CM | POA: Diagnosis not present

## 2018-02-11 DIAGNOSIS — Z8719 Personal history of other diseases of the digestive system: Secondary | ICD-10-CM | POA: Diagnosis not present

## 2018-02-11 DIAGNOSIS — R1900 Intra-abdominal and pelvic swelling, mass and lump, unspecified site: Secondary | ICD-10-CM | POA: Diagnosis not present

## 2018-02-11 DIAGNOSIS — Z6829 Body mass index (BMI) 29.0-29.9, adult: Secondary | ICD-10-CM | POA: Diagnosis not present

## 2018-02-11 DIAGNOSIS — M6208 Separation of muscle (nontraumatic), other site: Secondary | ICD-10-CM | POA: Diagnosis not present

## 2018-02-11 DIAGNOSIS — Z9889 Other specified postprocedural states: Secondary | ICD-10-CM | POA: Diagnosis not present

## 2018-02-18 DIAGNOSIS — R110 Nausea: Secondary | ICD-10-CM | POA: Diagnosis not present

## 2018-02-18 DIAGNOSIS — Z9889 Other specified postprocedural states: Secondary | ICD-10-CM | POA: Diagnosis not present

## 2018-02-18 DIAGNOSIS — Z8719 Personal history of other diseases of the digestive system: Secondary | ICD-10-CM | POA: Diagnosis not present

## 2018-02-18 DIAGNOSIS — K432 Incisional hernia without obstruction or gangrene: Secondary | ICD-10-CM | POA: Diagnosis not present

## 2018-02-19 DIAGNOSIS — H26493 Other secondary cataract, bilateral: Secondary | ICD-10-CM | POA: Diagnosis not present

## 2018-04-21 DIAGNOSIS — I10 Essential (primary) hypertension: Secondary | ICD-10-CM | POA: Diagnosis not present

## 2018-04-21 DIAGNOSIS — R7301 Impaired fasting glucose: Secondary | ICD-10-CM | POA: Diagnosis not present

## 2018-04-21 DIAGNOSIS — E782 Mixed hyperlipidemia: Secondary | ICD-10-CM | POA: Diagnosis not present

## 2018-04-23 DIAGNOSIS — R454 Irritability and anger: Secondary | ICD-10-CM | POA: Diagnosis not present

## 2018-04-23 DIAGNOSIS — G4733 Obstructive sleep apnea (adult) (pediatric): Secondary | ICD-10-CM | POA: Diagnosis not present

## 2018-04-23 DIAGNOSIS — E668 Other obesity: Secondary | ICD-10-CM | POA: Diagnosis not present

## 2018-04-23 DIAGNOSIS — Z6832 Body mass index (BMI) 32.0-32.9, adult: Secondary | ICD-10-CM | POA: Diagnosis not present

## 2018-04-23 DIAGNOSIS — I10 Essential (primary) hypertension: Secondary | ICD-10-CM | POA: Diagnosis not present

## 2018-04-23 DIAGNOSIS — R7301 Impaired fasting glucose: Secondary | ICD-10-CM | POA: Diagnosis not present

## 2018-04-23 DIAGNOSIS — R0902 Hypoxemia: Secondary | ICD-10-CM | POA: Diagnosis not present

## 2018-04-23 DIAGNOSIS — E782 Mixed hyperlipidemia: Secondary | ICD-10-CM | POA: Diagnosis not present

## 2018-04-23 DIAGNOSIS — J410 Simple chronic bronchitis: Secondary | ICD-10-CM | POA: Diagnosis not present

## 2018-05-25 DIAGNOSIS — L853 Xerosis cutis: Secondary | ICD-10-CM | POA: Diagnosis not present

## 2018-05-25 DIAGNOSIS — C44519 Basal cell carcinoma of skin of other part of trunk: Secondary | ICD-10-CM | POA: Diagnosis not present

## 2018-05-25 DIAGNOSIS — L578 Other skin changes due to chronic exposure to nonionizing radiation: Secondary | ICD-10-CM | POA: Diagnosis not present

## 2018-05-25 DIAGNOSIS — R233 Spontaneous ecchymoses: Secondary | ICD-10-CM | POA: Diagnosis not present

## 2018-05-25 DIAGNOSIS — L570 Actinic keratosis: Secondary | ICD-10-CM | POA: Diagnosis not present

## 2018-06-05 DIAGNOSIS — T1501XA Foreign body in cornea, right eye, initial encounter: Secondary | ICD-10-CM | POA: Diagnosis not present

## 2018-06-22 DIAGNOSIS — R918 Other nonspecific abnormal finding of lung field: Secondary | ICD-10-CM | POA: Diagnosis not present

## 2018-06-22 DIAGNOSIS — J439 Emphysema, unspecified: Secondary | ICD-10-CM | POA: Diagnosis not present

## 2018-06-24 DIAGNOSIS — K921 Melena: Secondary | ICD-10-CM | POA: Diagnosis not present

## 2018-06-24 DIAGNOSIS — K5730 Diverticulosis of large intestine without perforation or abscess without bleeding: Secondary | ICD-10-CM | POA: Diagnosis not present

## 2018-06-24 DIAGNOSIS — K648 Other hemorrhoids: Secondary | ICD-10-CM | POA: Diagnosis not present

## 2018-06-30 DIAGNOSIS — K219 Gastro-esophageal reflux disease without esophagitis: Secondary | ICD-10-CM | POA: Diagnosis not present

## 2018-06-30 DIAGNOSIS — R1310 Dysphagia, unspecified: Secondary | ICD-10-CM | POA: Diagnosis not present

## 2018-07-07 DIAGNOSIS — R05 Cough: Secondary | ICD-10-CM | POA: Diagnosis not present

## 2018-07-07 DIAGNOSIS — K760 Fatty (change of) liver, not elsewhere classified: Secondary | ICD-10-CM | POA: Diagnosis not present

## 2018-07-07 DIAGNOSIS — G4733 Obstructive sleep apnea (adult) (pediatric): Secondary | ICD-10-CM | POA: Diagnosis not present

## 2018-07-07 DIAGNOSIS — Z79899 Other long term (current) drug therapy: Secondary | ICD-10-CM | POA: Diagnosis not present

## 2018-07-07 DIAGNOSIS — R1310 Dysphagia, unspecified: Secondary | ICD-10-CM | POA: Diagnosis not present

## 2018-07-07 DIAGNOSIS — K222 Esophageal obstruction: Secondary | ICD-10-CM | POA: Diagnosis not present

## 2018-07-07 DIAGNOSIS — Z85828 Personal history of other malignant neoplasm of skin: Secondary | ICD-10-CM | POA: Diagnosis not present

## 2018-07-07 DIAGNOSIS — M1990 Unspecified osteoarthritis, unspecified site: Secondary | ICD-10-CM | POA: Diagnosis not present

## 2018-07-07 DIAGNOSIS — Z87891 Personal history of nicotine dependence: Secondary | ICD-10-CM | POA: Diagnosis not present

## 2018-07-07 DIAGNOSIS — J449 Chronic obstructive pulmonary disease, unspecified: Secondary | ICD-10-CM | POA: Diagnosis not present

## 2018-07-07 DIAGNOSIS — E7800 Pure hypercholesterolemia, unspecified: Secondary | ICD-10-CM | POA: Diagnosis not present

## 2018-07-07 DIAGNOSIS — I10 Essential (primary) hypertension: Secondary | ICD-10-CM | POA: Diagnosis not present

## 2018-07-07 DIAGNOSIS — K2970 Gastritis, unspecified, without bleeding: Secondary | ICD-10-CM | POA: Diagnosis not present

## 2018-07-07 DIAGNOSIS — K2950 Unspecified chronic gastritis without bleeding: Secondary | ICD-10-CM | POA: Diagnosis not present

## 2018-07-07 DIAGNOSIS — K219 Gastro-esophageal reflux disease without esophagitis: Secondary | ICD-10-CM | POA: Diagnosis not present

## 2018-07-07 DIAGNOSIS — K449 Diaphragmatic hernia without obstruction or gangrene: Secondary | ICD-10-CM | POA: Diagnosis not present

## 2018-07-15 DIAGNOSIS — L570 Actinic keratosis: Secondary | ICD-10-CM | POA: Diagnosis not present

## 2018-07-27 DIAGNOSIS — I10 Essential (primary) hypertension: Secondary | ICD-10-CM | POA: Diagnosis not present

## 2018-07-27 DIAGNOSIS — R7301 Impaired fasting glucose: Secondary | ICD-10-CM | POA: Diagnosis not present

## 2018-07-27 DIAGNOSIS — E782 Mixed hyperlipidemia: Secondary | ICD-10-CM | POA: Diagnosis not present

## 2018-07-29 DIAGNOSIS — K5904 Chronic idiopathic constipation: Secondary | ICD-10-CM | POA: Diagnosis not present

## 2018-07-29 DIAGNOSIS — I10 Essential (primary) hypertension: Secondary | ICD-10-CM | POA: Diagnosis not present

## 2018-07-29 DIAGNOSIS — Z6832 Body mass index (BMI) 32.0-32.9, adult: Secondary | ICD-10-CM | POA: Diagnosis not present

## 2018-07-29 DIAGNOSIS — G4733 Obstructive sleep apnea (adult) (pediatric): Secondary | ICD-10-CM | POA: Diagnosis not present

## 2018-07-29 DIAGNOSIS — J410 Simple chronic bronchitis: Secondary | ICD-10-CM | POA: Diagnosis not present

## 2018-07-29 DIAGNOSIS — R7301 Impaired fasting glucose: Secondary | ICD-10-CM | POA: Diagnosis not present

## 2018-07-29 DIAGNOSIS — K210 Gastro-esophageal reflux disease with esophagitis: Secondary | ICD-10-CM | POA: Diagnosis not present

## 2018-07-29 DIAGNOSIS — E782 Mixed hyperlipidemia: Secondary | ICD-10-CM | POA: Diagnosis not present

## 2018-07-29 DIAGNOSIS — E668 Other obesity: Secondary | ICD-10-CM | POA: Diagnosis not present

## 2018-07-29 DIAGNOSIS — R454 Irritability and anger: Secondary | ICD-10-CM | POA: Diagnosis not present

## 2018-08-19 DIAGNOSIS — R1310 Dysphagia, unspecified: Secondary | ICD-10-CM | POA: Diagnosis not present

## 2018-08-19 DIAGNOSIS — R05 Cough: Secondary | ICD-10-CM | POA: Diagnosis not present

## 2018-09-03 ENCOUNTER — Ambulatory Visit (INDEPENDENT_AMBULATORY_CARE_PROVIDER_SITE_OTHER): Payer: Medicare Other | Admitting: Allergy and Immunology

## 2018-09-03 ENCOUNTER — Encounter: Payer: Self-pay | Admitting: Allergy and Immunology

## 2018-09-03 ENCOUNTER — Telehealth: Payer: Self-pay

## 2018-09-03 VITALS — BP 128/74 | HR 81 | Temp 97.5°F | Resp 18 | Ht 68.0 in | Wt 206.2 lb

## 2018-09-03 DIAGNOSIS — J449 Chronic obstructive pulmonary disease, unspecified: Secondary | ICD-10-CM | POA: Diagnosis not present

## 2018-09-03 DIAGNOSIS — J3089 Other allergic rhinitis: Secondary | ICD-10-CM

## 2018-09-03 DIAGNOSIS — K219 Gastro-esophageal reflux disease without esophagitis: Secondary | ICD-10-CM

## 2018-09-03 MED ORDER — MOMETASONE FUROATE 200 MCG/ACT IN AERO: 2.0000 | INHALATION_SPRAY | Freq: Every day | RESPIRATORY_TRACT | 5 refills | Status: DC

## 2018-09-03 MED ORDER — MONTELUKAST SODIUM 10 MG PO TABS: 10.0000 mg | ORAL_TABLET | Freq: Every day | ORAL | 5 refills | Status: DC

## 2018-09-03 MED ORDER — RANITIDINE HCL 300 MG PO CAPS: 300.0000 mg | ORAL_CAPSULE | Freq: Every evening | ORAL | 5 refills | Status: DC

## 2018-09-03 NOTE — Telephone Encounter (Signed)
Called and spoke with patient to inform him that his ENT apt is Oct 7th at 9 am. I also informed him of their instructions to arrive 30 minutes early, bring photo ID and a list of his medications. Patient verbalized understanding. Patient stated he knew where they were located as he has been there before with another doctor.

## 2018-09-03 NOTE — Patient Instructions (Addendum)
  1.  Allergen avoidance measures  2.  Use the following medications for airway inflammation:   A.  Continue Stioito Respimat - 2 inhalations 1 time per day  B.  Asmanex 200 HFA - 2 inhalations 1 time per day with spacer  C.  Montelukast 10 mg - 1tablet 1 time per day  D.  OTC Nasacort - 1 spray each nostril 1 time per day  3.  Treat reflux with the following:   A.  Continue omeprazole 40 mg twice a day  B.  Start ranitidine 300 mg in evening  C.  Consolidate all caffeine and chocolate consumption slowly  4. If needed:   A.  Proventil HFA or similar 2 inhalations every 4-6 hours  B.  Cetirizine 10 mg tablet 1 time per day  5.  Evaluation of throat with the ENT  6.  Return to clinic in 4 weeks or earlier if problem

## 2018-09-03 NOTE — Progress Notes (Signed)
Dear Dr. Lyda Jester,  Thank you for referring [name] to the [facility] on 09/03/2018.   Below is a summation of this patient's evaluation and recommendations.  Thank you for your referral. I will keep you informed about this patient's response to treatment.   If you have any questions please do not hesitate to contact me.   Sincerely,  Jiles Prows, MD Allergy / Immunology [place]   ______________________________________________________________________    NEW PATIENT NOTE  Referring Provider: Kyra Leyland, MD Primary Provider: [name], MD Date of office visit: 09/03/2018    Subjective:   Chief Complaint:  William Clay (DOB: 10-09-1941) is a 77 y.o. male who presents to the clinic on 09/03/2018 with a chief complaint of Cough .     HPI: Clint presents to this clinic in evaluation of cough.  He has a long history of tobacco smoke induced COPD followed by Dr. Melvyn Novas in the past.  He does have dyspnea on exertion with 50 to 100 feet of walking but for the most part feels as though this is stable while he consistently uses a combination [place] / [place] inhaler.  What bothers him the most is the fact that he has spells of cough throughout the day.  He will develop 4-6 episodes of unrelenting coughing associated with gagging and retching that leaves him exhausted and ends up making his nose run and he has a little sneezing at the end of his coughing episode.  He feels as though there is a "stone" or "sand" in his throat which precipitates this cough.  He does get hoarse with this cough.  He did smoke for 50 years at about 2 packs/day which he discontinued in 2009.  Interestingly, he does not cough at nighttime.  He does apparently have very significant reflux disease even in the face of using omeprazole twice a day.  He still has regurgitation and burning up in his  throat.  He consumes 1 coffee per day and 1 tea per day and does not consume any chocolate or ethanol.  He does not smoke at this point.  He did undergo esophageal dilation with Dr. Lyda Jester in August 2019 for dysphasia and apparently that is not an issue at this point in time.  Past Medical History:  Diagnosis Date  . Bowel obstruction ([place]) 2018  . COPD (chronic obstructive pulmonary disease) ([place])   . Pneumonia 1308,6578    Past Surgical History:  Procedure Laterality Date  . BACK SURGERY N/A 4696,[id number],2536   Lower back  . BACK SURGERY  1984   upper back  . CATARACT EXTRACTION Left 2017  . CATARACT EXTRACTION Right 2017  . CHOLECYSTECTOMY  2018  . HERNIA REPAIR  2018  . ROTATOR CUFF REPAIR Right 2015  . ROTATOR CUFF REPAIR Left 2017    Allergies as of 09/03/2018      Reactions   Lisinopril Other (See Comments)   Dyspnea Dyspnea   Simvastatin Other (See Comments)   Muscle Cramps Muscle Cramps   Amoxicillin    Mouth sores       Medication List      albuterol 108 (90 Base) MCG/ACT inhaler Commonly known as:  PROVENTIL HFA;VENTOLIN HFA Inhale into the lungs.   amLODipine 10 MG tablet Commonly known as:  NORVASC Take 10 mg by mouth daily.   cetirizine 10 MG tablet Commonly known as:  ZYRTEC Take  10 mg by mouth daily.   imipenem-cilastatin 500 mg in sodium chloride 0.9 % 100 mL Inject 500 mg into the vein every 6 (six) hours.   ipratropium-albuterol 0.5-2.5 (3) MG/3ML Soln Commonly known as:  DUONEB Take 3 mLs by nebulization 4 (four) times daily.   losartan 50 MG tablet Commonly known as:  COZAAR Take 50 mg by mouth daily.   METAMUCIL FIBER PO Take 1 tablet by mouth daily.   multivitamin with minerals tablet Take 1 tablet by mouth daily.   omeprazole 40 MG capsule Commonly known as:  PRILOSEC Take 40 mg by mouth daily.   polyethylene glycol packet Commonly known as:  MIRALAX / GLYCOLAX Take 17 g by mouth daily as needed.     pravastatin 20 MG tablet Commonly known as:  PRAVACHOL   RESTORA PO Take 1 tablet by mouth daily.   sertraline 50 MG tablet Commonly known as:  ZOLOFT   STIOLTO RESPIMAT 2.5-2.5 MCG/ACT Aers Generic drug:  Tiotropium Bromide-Olodaterol Inhale into the lungs.       Review of systems negative except as noted in HPI / PMHx or noted below:  Review of Systems  Constitutional: Negative.   HENT: Negative.   Eyes: Negative.   Respiratory: Negative.   Cardiovascular: Negative.   Gastrointestinal: Negative.   Genitourinary: Negative.   Musculoskeletal: Negative.   Skin: Negative.   Neurological: Negative.   Endo/Heme/Allergies: Negative.   Psychiatric/Behavioral: Negative.     History reviewed. No pertinent family history.  Social History   Socioeconomic History  . Marital status: Married    Spouse name: Not on file  . Number of children: Not on file  . Years of education: Not on file  . Highest education level: Not on file  Occupational History  . Not on file  Social Needs  . Financial resource strain: Not on file  . Food insecurity:    Worry: Not on file    Inability: Not on file  . Transportation needs:    Medical: Not on file    Non-medical: Not on file  Tobacco Use  . Smoking status: Former Smoker    Packs/day: 2.00    Years: 50.00    Pack years: 100.00    Types: Cigarettes    Last attempt to quit: 12/24/2007    Years since quitting: 10.7  . Smokeless tobacco: Never Used  Substance and Sexual Activity  . Alcohol use: No    Alcohol/week: 0.0 standard drinks  . Drug use: No  . Sexual activity: Not on file  Lifestyle  . Physical activity:    Days per week: Not on file    Minutes per session: Not on file  . Stress: Not on file  Relationships  . Social connections:    Talks on phone: Not on file    Gets together: Not on file    Attends religious service: Not on file    Active member of club or organization: Not on file    Attends meetings of clubs  or organizations: Not on file    Relationship status: Not on file  . Intimate partner violence:    Fear of current or ex partner: Not on file    Emotionally abused: Not on file    Physically abused: Not on file    Forced sexual activity: Not on file  Other Topics Concern  . Not on file  Social History Narrative  . Not on file    Environmental and Social history  Lives in  a house with a dry environment, a dog and cat located inside the household, carpet in the bedroom, no plastic for the bed, no plastic for the pillow, no smokers located inside the household.  Objective:   Vitals:   09/03/18 1333  BP: 128/74  Pulse: 81  Resp: 18  Temp: (!) 97.5 F (36.4 C)  SpO2: 90%   Height: 5\' 8"  (172.7 cm) Weight: 206 lb 3.2 oz (93.5 kg)  Physical Exam  HENT:  Head: Normocephalic. Head is without right periorbital erythema and without left periorbital erythema.  Right Ear: External ear normal.  Left Ear: External ear normal.  Nose: Nose normal. No mucosal edema or rhinorrhea.  Mouth/Throat: Oropharynx is clear and moist and mucous membranes are normal. No oropharyngeal exudate.  Eyes: Pupils are equal, round, and reactive to light. Conjunctivae and lids are normal.  Neck: Trachea normal. No tracheal deviation present. No thyromegaly present.  Cardiovascular: Normal rate, regular rhythm, S1 normal, S2 normal and normal heart sounds.  No murmur heard. Pulmonary/Chest: Effort normal. No stridor. No respiratory distress. He has no wheezes. He has no rales. He exhibits no tenderness.  Abdominal: Soft. He exhibits no distension and no mass. There is no hepatosplenomegaly. There is no tenderness. There is no rebound and no guarding.  Musculoskeletal: He exhibits no edema or tenderness.  Lymphadenopathy:       Head (right side): No tonsillar adenopathy present.       Head (left side): No tonsillar adenopathy present.    He has no cervical adenopathy.    He has no axillary adenopathy.   Neurological: He is alert.  Skin: No rash noted. He is not diaphoretic. No erythema. No pallor. Nails show no clubbing.    Diagnostics: Allergy skin tests were performed.  He demonstrated hypersensitivity to mold.  Spirometry was performed and demonstrated an FEV1 of 2.48 @ 87 % of predicted. FEV1/FVC = 0.66.  Following the administration of nebulized albuterol his FEV1 did not improve.  Results of lung volume obtained for May 2016 identified TLC 104%, RV 124%, DL/VA 47%  Results of a barium swallow obtained 30 June 2018 identified mild changes of presbyesophagus and spontaneous reflux.  Results of a chest x-ray obtained 02 February 2018 identified the following:  There is scarring in the right base. There is slight bibasilar atelectasis. There is no edema or consolidation. Heart is upper normal in size with pulmonary vascularity within normal limits. No adenopathy. No pneumothorax. There is degenerative change in each shoulder. No acute fracture evident.  Results of the sinus CT scan obtained 30 January 2015 identified the following:  There is opacification of a medial inferior left ethmoid air cell. Other visualized paranasal sinuses are clear. No air-fluid level. No bony destruction or expansion. Ostiomeatal unit complexes are patent bilaterally. Nares are patent bilaterally. There is no appreciable nasal turbinate edema. There is mild rightward deviation of the nasal septum.  Visualized orbital regions appear symmetric and normal bilaterally. Visualized brain parenchyma appears unremarkable except for mild atrophy. Visualized soft tissues appear unremarkable.   Assessment and Plan:    1. COPD with asthma ([place])   2. Perennial allergic rhinitis   3. LPRD (laryngopharyngeal reflux disease)     1.  Allergen avoidance measures  2.  Use the following medications for airway inflammation:   A.  Continue [place] Respimat - 2 inhalations 1 time per day  B.  Asmanex 200 HFA  - 2 inhalations 1 time per day with spacer  C.  Montelukast 10 mg - 1tablet 1 time per day  D.  OTC Nasacort - 1 spray each nostril 1 time per day  3.  Treat reflux with the following:   A.  Continue omeprazole 40 mg twice a day  B.  Start ranitidine 300 mg in evening  C.  Consolidate all caffeine and chocolate consumption slowly  4. If needed:   A.  Proventil HFA or similar 2 inhalations every 4-6 hours  B.  Cetirizine 10 mg tablet 1 time per day  5.  Evaluation of throat with the ENT  6.  Return to clinic in 4 weeks or earlier if problem  Clint has a history very consistent with significant irritation and inflammation of his airway most likely secondary to a collection of insults including his previous tobacco use, allergic disease, and reflux induced respiratory disease.  I have established a plan noted above to address inflammation and reflux and because of his 50-year history of smoking tobacco I think he needs a thorough evaluation by an ear nose and throat physician looking at his laryngeal structures.  I will see him back in this clinic in 4 weeks or earlier if there is a problem.  Jiles Prows, MD Allergy / Immunology [facility]

## 2018-09-04 ENCOUNTER — Other Ambulatory Visit: Payer: Self-pay

## 2018-09-04 MED ORDER — ALBUTEROL SULFATE HFA 108 (90 BASE) MCG/ACT IN AERS: INHALATION_SPRAY | RESPIRATORY_TRACT | 1 refills | Status: AC

## 2018-09-07 ENCOUNTER — Encounter: Payer: Self-pay | Admitting: Allergy and Immunology

## 2018-09-28 DIAGNOSIS — H6123 Impacted cerumen, bilateral: Secondary | ICD-10-CM | POA: Diagnosis not present

## 2018-09-28 DIAGNOSIS — R1310 Dysphagia, unspecified: Secondary | ICD-10-CM | POA: Diagnosis not present

## 2018-09-28 DIAGNOSIS — Z87891 Personal history of nicotine dependence: Secondary | ICD-10-CM | POA: Diagnosis not present

## 2018-09-28 DIAGNOSIS — K219 Gastro-esophageal reflux disease without esophagitis: Secondary | ICD-10-CM | POA: Diagnosis not present

## 2018-09-28 DIAGNOSIS — R05 Cough: Secondary | ICD-10-CM | POA: Diagnosis not present

## 2018-09-28 DIAGNOSIS — J342 Deviated nasal septum: Secondary | ICD-10-CM | POA: Diagnosis not present

## 2018-09-30 ENCOUNTER — Ambulatory Visit (INDEPENDENT_AMBULATORY_CARE_PROVIDER_SITE_OTHER): Payer: Medicare Other | Admitting: Allergy and Immunology

## 2018-09-30 ENCOUNTER — Encounter: Payer: Self-pay | Admitting: Allergy and Immunology

## 2018-09-30 VITALS — BP 116/80 | HR 72 | Resp 18

## 2018-09-30 DIAGNOSIS — J449 Chronic obstructive pulmonary disease, unspecified: Secondary | ICD-10-CM | POA: Diagnosis not present

## 2018-09-30 DIAGNOSIS — J3089 Other allergic rhinitis: Secondary | ICD-10-CM | POA: Diagnosis not present

## 2018-09-30 DIAGNOSIS — K219 Gastro-esophageal reflux disease without esophagitis: Secondary | ICD-10-CM | POA: Diagnosis not present

## 2018-09-30 NOTE — Patient Instructions (Addendum)
  1.  Continue to perform allergen avoidance measures  2.  Continue to use the following medications for airway inflammation:   A.  Stiolto Respimat - 2 inhalations 1 time per day  B.  Arnuity 50 - 1 inhalations 1 time per day. samples  C.  Montelukast 10 mg - 1 tablet 1 time per day  D.  OTC Nasacort - 1 spray each nostril 1 time per day  3.  Continue to treat reflux with the following:   A.  Omeprazole 40 mg twice a day  B.  Ranitidine 300 mg in evening  C.  Consolidate all caffeine and chocolate consumption slowly  4. If needed:   A.  Proventil HFA or similar 2 inhalations every 4-6 hours  B.  Cetirizine 10 mg tablet 1 time per day  5.  Obtain fall flu vaccine  6.  Return to clinic in 8 weeks or earlier if problem

## 2018-09-30 NOTE — Progress Notes (Signed)
Follow-up Note  Referring Provider: [name], MD Primary Provider: [name], MD Date of Office Visit: 09/30/2018  Subjective:   William Clay (DOB: 08-Dec-1941) is a 77 y.o. male who returns to the [facility] on 09/30/2018 in re-evaluation of the following:  HPI: William Clay returns to this clinic in evaluation of cough.  I last saw him in this clinic on 03 September 2018.  He believes he is pretty much the same.  He still has spells of cough throughout the day and still feels as though he has a "stone" and "sand" in his throat.  He did visit with ENT who documented continued problems with reflux affecting his [place].  He has lowered his caffeine content about 50%.  He has been very good about following his plan of therapy established during his last visit however he had to stop his Asmanex inhaler after 2 weeks because it ended up breaking out his lower lip.  Fortunately that issue has resolved.  He will be visiting [place] in the next few weeks and his plan is to drive up [facility] one afternoon which is at 14,000 feet altitude.  He does have oxygen and plans on bringing it to his [place] vacation.  Allergies as of 09/30/2018      Reactions   Lisinopril Other (See Comments)   Dyspnea Dyspnea   Simvastatin Other (See Comments)   Muscle Cramps Muscle Cramps   Amoxicillin    Mouth sores       Medication List      albuterol 108 (90 Base) MCG/ACT inhaler Commonly known as:  PROVENTIL HFA;VENTOLIN HFA Inhale two puffs every four to six hours as needed for cough or wheeze.   amLODipine 10 MG tablet Commonly known as:  NORVASC Take 10 mg by mouth daily.   cetirizine 10 MG tablet Commonly known as:  ZYRTEC Take 10 mg by mouth daily.   ipratropium-albuterol 0.5-2.5 (3) MG/3ML Soln Commonly known as:  DUONEB Take 3 mLs by nebulization 4 (four) times daily.   losartan 50 MG tablet Commonly known as:  COZAAR Take 50 mg by mouth daily.   METAMUCIL FIBER  PO Take 1 tablet by mouth daily.   montelukast 10 MG tablet Commonly known as:  SINGULAIR Take 1 tablet (10 mg total) by mouth at bedtime.   multivitamin with minerals tablet Take 1 tablet by mouth daily.   omeprazole 40 MG capsule Commonly known as:  PRILOSEC Take 40 mg by mouth daily.   polyethylene glycol packet Commonly known as:  MIRALAX / GLYCOLAX Take 17 g by mouth daily as needed.   pravastatin 20 MG tablet Commonly known as:  PRAVACHOL   ranitidine 300 MG capsule Commonly known as:  ZANTAC Take 1 capsule (300 mg total) by mouth every evening.   RESTORA PO Take 1 tablet by mouth daily.   sertraline 50 MG tablet Commonly known as:  ZOLOFT   STIOLTO RESPIMAT 2.5-2.5 MCG/ACT Aers Generic drug:  Tiotropium Bromide-Olodaterol Inhale into the lungs.       Past Medical History:  Diagnosis Date  . Bowel obstruction ([place]) 2018  . COPD (chronic obstructive pulmonary disease) ([place])   . Pneumonia 5916,3846    Past Surgical History:  Procedure Laterality Date  . BACK SURGERY N/A 6599,[id number],3300   Lower back  . BACK SURGERY  1984   upper back  . CATARACT EXTRACTION Left 2017  . CATARACT EXTRACTION Right 2017  . CHOLECYSTECTOMY  2018  . HERNIA  REPAIR  2018  . ROTATOR CUFF REPAIR Right 2015  . ROTATOR CUFF REPAIR Left 2017    Review of systems negative except as noted in HPI / PMHx or noted below:  Review of Systems  Constitutional: Negative.   HENT: Negative.   Eyes: Negative.   Respiratory: Negative.   Cardiovascular: Negative.   Gastrointestinal: Negative.   Genitourinary: Negative.   Musculoskeletal: Negative.   Skin: Negative.   Neurological: Negative.   Endo/Heme/Allergies: Negative.   Psychiatric/Behavioral: Negative.      Objective:   Vitals:   09/30/18 0841  BP: 116/80  Pulse: 72  Resp: 18          Physical Exam  HENT:  Head: Normocephalic.  Right Ear: Tympanic membrane, external ear and ear canal normal.  Left  Ear: Tympanic membrane, external ear and ear canal normal.  Nose: Nose normal. No mucosal edema or rhinorrhea.  Mouth/Throat: Uvula is midline, oropharynx is clear and moist and mucous membranes are normal. No oropharyngeal exudate.  Eyes: Conjunctivae are normal.  Neck: Trachea normal. No tracheal tenderness present. No tracheal deviation present. No thyromegaly present.  Cardiovascular: Normal rate, regular rhythm, S1 normal, S2 normal and normal heart sounds.  No murmur heard. Pulmonary/Chest: Breath sounds normal. No stridor. No respiratory distress. He has no wheezes. He has no rales.  Musculoskeletal: He exhibits no edema.  Lymphadenopathy:       Head (right side): No tonsillar adenopathy present.       Head (left side): No tonsillar adenopathy present.    He has no cervical adenopathy.  Neurological: He is alert.  Skin: No rash noted. He is not diaphoretic. No erythema. Nails show no clubbing.    Diagnostics:    Spirometry was performed and demonstrated an FEV1 of 2.32 at 82 % of predicted.  Assessment and Plan:   1. COPD with asthma ([place])   2. Perennial allergic rhinitis   3. LPRD (laryngopharyngeal reflux disease)     1.  Continue to perform allergen avoidance measures  2.  Continue to use the following medications for airway inflammation:   A.  Stiolto Respimat - 2 inhalations 1 time per day  B.  Arnuity 50 - 1 inhalations 1 time per day. samples  C.  Montelukast 10 mg - 1 tablet 1 time per day  D.  OTC Nasacort - 1 spray each nostril 1 time per day  3.  Continue to treat reflux with the following:   A.  Omeprazole 40 mg twice a day  B.  Ranitidine 300 mg in evening  C.  Consolidate all caffeine and chocolate consumption slowly  4. If needed:   A.  Proventil HFA or similar 2 inhalations every 4-6 hours  B.  Cetirizine 10 mg tablet 1 time per day  5.  Obtain fall flu vaccine  6.  Return to clinic in 8 weeks or earlier if problem  I am going to give William Clay a  full 12 weeks of therapy directed against reflux in the hope that he will start to have a response at that point in time with this form of treatment.  As well, I given him samples of Arnuity to use at relatively low dose while he continues on his other medications for respiratory tract inflammation.  I had a talk with him today about his upcoming trip to [place] and his plan to visit [facility].  We talked about oxygen content at 14,000 feet and how he needs to be very careful  about this relative low oxygen environment and he should definitely bring his oxygen with him during that part of his vacation.  I will see him back in this clinic in 8 weeks or earlier if there is a problem.  Allena Katz, MD Allergy / Immunology [place]

## 2018-10-01 ENCOUNTER — Encounter: Payer: Self-pay | Admitting: Allergy and Immunology

## 2018-10-09 DIAGNOSIS — R499 Unspecified voice and resonance disorder: Secondary | ICD-10-CM | POA: Diagnosis not present

## 2018-10-09 DIAGNOSIS — R1310 Dysphagia, unspecified: Secondary | ICD-10-CM | POA: Diagnosis not present

## 2018-10-15 DIAGNOSIS — Z23 Encounter for immunization: Secondary | ICD-10-CM | POA: Diagnosis not present

## 2018-10-15 DIAGNOSIS — E668 Other obesity: Secondary | ICD-10-CM | POA: Diagnosis not present

## 2018-10-15 DIAGNOSIS — Z0001 Encounter for general adult medical examination with abnormal findings: Secondary | ICD-10-CM | POA: Diagnosis not present

## 2018-10-15 DIAGNOSIS — Z6832 Body mass index (BMI) 32.0-32.9, adult: Secondary | ICD-10-CM | POA: Diagnosis not present

## 2018-11-03 DIAGNOSIS — Z125 Encounter for screening for malignant neoplasm of prostate: Secondary | ICD-10-CM | POA: Diagnosis not present

## 2018-11-03 DIAGNOSIS — M1811 Unilateral primary osteoarthritis of first carpometacarpal joint, right hand: Secondary | ICD-10-CM | POA: Diagnosis not present

## 2018-11-03 DIAGNOSIS — R7301 Impaired fasting glucose: Secondary | ICD-10-CM | POA: Diagnosis not present

## 2018-11-03 DIAGNOSIS — E782 Mixed hyperlipidemia: Secondary | ICD-10-CM | POA: Diagnosis not present

## 2018-11-03 DIAGNOSIS — R499 Unspecified voice and resonance disorder: Secondary | ICD-10-CM | POA: Diagnosis not present

## 2018-11-03 DIAGNOSIS — R1310 Dysphagia, unspecified: Secondary | ICD-10-CM | POA: Diagnosis not present

## 2018-11-03 DIAGNOSIS — I10 Essential (primary) hypertension: Secondary | ICD-10-CM | POA: Diagnosis not present

## 2018-11-03 DIAGNOSIS — M1812 Unilateral primary osteoarthritis of first carpometacarpal joint, left hand: Secondary | ICD-10-CM | POA: Diagnosis not present

## 2018-11-04 DIAGNOSIS — I10 Essential (primary) hypertension: Secondary | ICD-10-CM | POA: Diagnosis not present

## 2018-11-04 DIAGNOSIS — R454 Irritability and anger: Secondary | ICD-10-CM | POA: Diagnosis not present

## 2018-11-04 DIAGNOSIS — E782 Mixed hyperlipidemia: Secondary | ICD-10-CM | POA: Diagnosis not present

## 2018-11-04 DIAGNOSIS — J410 Simple chronic bronchitis: Secondary | ICD-10-CM | POA: Diagnosis not present

## 2018-11-04 DIAGNOSIS — K210 Gastro-esophageal reflux disease with esophagitis: Secondary | ICD-10-CM | POA: Diagnosis not present

## 2018-11-04 DIAGNOSIS — E668 Other obesity: Secondary | ICD-10-CM | POA: Diagnosis not present

## 2018-11-04 DIAGNOSIS — G4733 Obstructive sleep apnea (adult) (pediatric): Secondary | ICD-10-CM | POA: Diagnosis not present

## 2018-11-04 DIAGNOSIS — M19032 Primary osteoarthritis, left wrist: Secondary | ICD-10-CM | POA: Diagnosis not present

## 2018-11-04 DIAGNOSIS — J9601 Acute respiratory failure with hypoxia: Secondary | ICD-10-CM | POA: Diagnosis not present

## 2018-11-04 DIAGNOSIS — R7301 Impaired fasting glucose: Secondary | ICD-10-CM | POA: Diagnosis not present

## 2018-11-06 ENCOUNTER — Other Ambulatory Visit: Payer: Self-pay

## 2018-11-12 DIAGNOSIS — R499 Unspecified voice and resonance disorder: Secondary | ICD-10-CM | POA: Diagnosis not present

## 2018-11-12 DIAGNOSIS — M1812 Unilateral primary osteoarthritis of first carpometacarpal joint, left hand: Secondary | ICD-10-CM | POA: Diagnosis not present

## 2018-11-12 DIAGNOSIS — R1310 Dysphagia, unspecified: Secondary | ICD-10-CM | POA: Diagnosis not present

## 2018-11-12 DIAGNOSIS — M1811 Unilateral primary osteoarthritis of first carpometacarpal joint, right hand: Secondary | ICD-10-CM | POA: Diagnosis not present

## 2018-11-18 DIAGNOSIS — R1310 Dysphagia, unspecified: Secondary | ICD-10-CM | POA: Diagnosis not present

## 2018-11-18 DIAGNOSIS — M1811 Unilateral primary osteoarthritis of first carpometacarpal joint, right hand: Secondary | ICD-10-CM | POA: Diagnosis not present

## 2018-11-18 DIAGNOSIS — R499 Unspecified voice and resonance disorder: Secondary | ICD-10-CM | POA: Diagnosis not present

## 2018-11-18 DIAGNOSIS — M1812 Unilateral primary osteoarthritis of first carpometacarpal joint, left hand: Secondary | ICD-10-CM | POA: Diagnosis not present

## 2018-11-25 ENCOUNTER — Ambulatory Visit: Payer: Medicare Other | Admitting: Allergy and Immunology

## 2018-11-26 DIAGNOSIS — M1812 Unilateral primary osteoarthritis of first carpometacarpal joint, left hand: Secondary | ICD-10-CM | POA: Diagnosis not present

## 2018-11-26 DIAGNOSIS — R499 Unspecified voice and resonance disorder: Secondary | ICD-10-CM | POA: Diagnosis not present

## 2018-11-26 DIAGNOSIS — M1811 Unilateral primary osteoarthritis of first carpometacarpal joint, right hand: Secondary | ICD-10-CM | POA: Diagnosis not present

## 2018-11-26 DIAGNOSIS — R1310 Dysphagia, unspecified: Secondary | ICD-10-CM | POA: Diagnosis not present

## 2018-11-30 ENCOUNTER — Ambulatory Visit (INDEPENDENT_AMBULATORY_CARE_PROVIDER_SITE_OTHER): Payer: Medicare Other | Admitting: Allergy and Immunology

## 2018-11-30 ENCOUNTER — Encounter: Payer: Self-pay | Admitting: Allergy and Immunology

## 2018-11-30 VITALS — BP 130/90 | HR 75 | Resp 18

## 2018-11-30 DIAGNOSIS — J449 Chronic obstructive pulmonary disease, unspecified: Secondary | ICD-10-CM | POA: Diagnosis not present

## 2018-11-30 DIAGNOSIS — K219 Gastro-esophageal reflux disease without esophagitis: Secondary | ICD-10-CM | POA: Diagnosis not present

## 2018-11-30 DIAGNOSIS — J3089 Other allergic rhinitis: Secondary | ICD-10-CM | POA: Diagnosis not present

## 2018-11-30 NOTE — Patient Instructions (Addendum)
  1.  Continue to perform allergen avoidance measures  2.  Continue to use the following medications for airway inflammation:   A.  Trelegy 1 inhalation 1 time per day  B.  Discontinue Arnuity and Stiolto  C.  OTC Nasacort - 1 spray each nostril 1 time per day  D.  Discontinue montelukast  3.  Continue to treat reflux with the following:   A.  Omeprazole 40 mg twice a day  B.  Famotidine 40 mg in evening (replaces ranitidine)  C.  Consolidate all caffeine and chocolate consumption slowly  4. If needed:   A.  Proventil HFA or similar 2 inhalations every 4-6 hours  B.  Cetirizine 10 mg tablet 1 time per day  5.  Return to clinic in 4 months or earlier if problem

## 2018-11-30 NOTE — Progress Notes (Signed)
Follow-up Note  Referring Provider: [name], MD Primary Provider: [name], MD Date of Office Visit: 11/30/2018  Subjective:   William Clay (DOB: 1941-04-16) is a 77 y.o. male who returns to the [facility] on 11/30/2018 in re-evaluation of the following:  HPI: William Clay returns to this clinic in evaluation of his cough believed secondary to a component of COPD with asthma and an issue with LPR.  He was last seen in this clinic on 30 September 2018.  Overall he has shown significant improvement with 12 weeks of therapy directed against reflux.  His throat is tremendously improved.  He is now seeing a speech therapist and is undergoing speech therapy and this has also helped him significantly.  He has not been having any problems with reflux.  His breathing is really doing quite well.  He has been using a combination of anti-inflammatory agents for his lower airway and he rarely uses a short acting bronchodilator.  Overall he feels very good.  He does continue on oxygen although he does not really use this the majority of the day.  He does have a CPAP machine which obviously he uses at nighttime.  He did receive the flu vaccine this year.  Allergies as of 11/30/2018      Reactions   Lisinopril Other (See Comments)   Dyspnea Dyspnea   Simvastatin Other (See Comments)   Muscle Cramps Muscle Cramps   Amoxicillin    Mouth sores       Medication List      albuterol 108 (90 Base) MCG/ACT inhaler Commonly known as:  PROVENTIL HFA;VENTOLIN HFA Inhale two puffs every four to six hours as needed for cough or wheeze.   amLODipine 10 MG tablet Commonly known as:  NORVASC Take 10 mg by mouth daily.   cetirizine 10 MG tablet Commonly known as:  ZYRTEC Take 10 mg by mouth daily.   ipratropium-albuterol 0.5-2.5 (3) MG/3ML Soln Commonly known as:  DUONEB Take 3 mLs by nebulization 4 (four) times daily.   losartan 50 MG tablet Commonly known as:  COZAAR Take 50  mg by mouth daily.   METAMUCIL FIBER PO Take 1 tablet by mouth daily.   Mometasone Furoate 200 MCG/ACT Aero Inhale 2 puffs into the lungs daily.   montelukast 10 MG tablet Commonly known as:  SINGULAIR Take 1 tablet (10 mg total) by mouth at bedtime.   multivitamin with minerals tablet Take 1 tablet by mouth daily.   omeprazole 40 MG capsule Commonly known as:  PRILOSEC Take 40 mg by mouth daily.   polyethylene glycol packet Commonly known as:  MIRALAX / GLYCOLAX Take 17 g by mouth daily as needed.   pravastatin 20 MG tablet Commonly known as:  PRAVACHOL   ranitidine 300 MG capsule Commonly known as:  ZANTAC Take 1 capsule (300 mg total) by mouth every evening.   RESTORA PO Take 1 tablet by mouth daily.   sertraline 50 MG tablet Commonly known as:  ZOLOFT   STIOLTO RESPIMAT 2.5-2.5 MCG/ACT Aers Generic drug:  Tiotropium Bromide-Olodaterol Inhale into the lungs.       Past Medical History:  Diagnosis Date  . Bowel obstruction ([place]) 2018  . COPD (chronic obstructive pulmonary disease) ([place])   . Pneumonia 2956,2130    Past Surgical History:  Procedure Laterality Date  . BACK SURGERY N/A [id number]   Lower back  . BACK SURGERY  1984   upper back  . CATARACT EXTRACTION Left 2017  .  CATARACT EXTRACTION Right 2017  . CHOLECYSTECTOMY  2018  . HERNIA REPAIR  2018  . ROTATOR CUFF REPAIR Right 2015  . ROTATOR CUFF REPAIR Left 2017    Review of systems negative except as noted in HPI / PMHx or noted below:  Review of Systems  Constitutional: Negative.   HENT: Negative.   Eyes: Negative.   Respiratory: Negative.   Cardiovascular: Negative.   Gastrointestinal: Negative.   Genitourinary: Negative.   Musculoskeletal: Negative.   Skin: Negative.   Neurological: Negative.   Endo/Heme/Allergies: Negative.   Psychiatric/Behavioral: Negative.      Objective:   Vitals:   11/30/18 1515  BP: 130/90  Pulse: 75  Resp: 18  SpO2: 92%           Physical Exam  HENT:  Head: Normocephalic.  Right Ear: External ear normal.  Left Ear: External ear normal.  Nose: Nose normal. No mucosal edema or rhinorrhea.  Mouth/Throat: Uvula is midline, oropharynx is clear and moist and mucous membranes are normal. No oropharyngeal exudate.  Eyes: Conjunctivae are normal.  Neck: Trachea normal. No tracheal tenderness present. No tracheal deviation present. No thyromegaly present.  Cardiovascular: Normal rate, regular rhythm, S1 normal, S2 normal and normal heart sounds.  No murmur heard. Pulmonary/Chest: Breath sounds normal. No stridor. No respiratory distress. He has no wheezes. He has no rales.  Musculoskeletal: He exhibits no edema.  Lymphadenopathy:       Head (right side): No tonsillar adenopathy present.       Head (left side): No tonsillar adenopathy present.    He has no cervical adenopathy.  Neurological: He is alert.  Skin: No rash noted. He is not diaphoretic. No erythema. Nails show no clubbing.    Diagnostics:    Spirometry was performed and demonstrated an FEV1 of 2.46 at 87 % of predicted.  The patient had an Asthma Control Test with the following results: ACT Total Score: 11.    Assessment and Plan:   1. COPD with asthma ([place])   2. Perennial allergic rhinitis   3. LPRD (laryngopharyngeal reflux disease)     1.  Continue to perform allergen avoidance measures  2.  Continue to use the following medications for airway inflammation:   A.  Trelegy 1 inhalation 1 time per day  B.  Discontinue Arnuity and Stiolto  C.  OTC Nasacort - 1 spray each nostril 1 time per day  D.  Discontinue montelukast  3.  Continue to treat reflux with the following:   A.  Omeprazole 40 mg twice a day  B.  Famotidine 40 mg in evening (replaces ranitidine)  C.  Consolidate all caffeine and chocolate consumption slowly  4. If needed:   A.  Proventil HFA or similar 2 inhalations every 4-6 hours  B.  Cetirizine 10 mg tablet 1  time per day  5.  Return to clinic in 4 months or earlier if problem  William Clay will attempt to consolidate his medical treatment by using a triple inhaler to replace his Arnuity and Stiletto and will see if he does well without the use of a leukotriene modifier.  He will continue to treat reflux aggressively as noted above.  I will see him back in this clinic in 4 months or earlier if there is a problem.  Allena Katz, MD Allergy / Immunology [facility]

## 2018-12-01 ENCOUNTER — Encounter: Payer: Self-pay | Admitting: Allergy and Immunology

## 2018-12-01 MED ORDER — FAMOTIDINE 40 MG PO TABS: ORAL_TABLET | ORAL | 5 refills | Status: DC

## 2018-12-03 DIAGNOSIS — R1310 Dysphagia, unspecified: Secondary | ICD-10-CM | POA: Diagnosis not present

## 2018-12-03 DIAGNOSIS — M1811 Unilateral primary osteoarthritis of first carpometacarpal joint, right hand: Secondary | ICD-10-CM | POA: Diagnosis not present

## 2018-12-03 DIAGNOSIS — M1812 Unilateral primary osteoarthritis of first carpometacarpal joint, left hand: Secondary | ICD-10-CM | POA: Diagnosis not present

## 2018-12-03 DIAGNOSIS — R499 Unspecified voice and resonance disorder: Secondary | ICD-10-CM | POA: Diagnosis not present

## 2018-12-07 DIAGNOSIS — M1812 Unilateral primary osteoarthritis of first carpometacarpal joint, left hand: Secondary | ICD-10-CM | POA: Diagnosis not present

## 2018-12-07 DIAGNOSIS — R1310 Dysphagia, unspecified: Secondary | ICD-10-CM | POA: Diagnosis not present

## 2018-12-07 DIAGNOSIS — R499 Unspecified voice and resonance disorder: Secondary | ICD-10-CM | POA: Diagnosis not present

## 2018-12-07 DIAGNOSIS — M1811 Unilateral primary osteoarthritis of first carpometacarpal joint, right hand: Secondary | ICD-10-CM | POA: Diagnosis not present

## 2018-12-08 DIAGNOSIS — M1812 Unilateral primary osteoarthritis of first carpometacarpal joint, left hand: Secondary | ICD-10-CM | POA: Diagnosis not present

## 2018-12-08 DIAGNOSIS — R499 Unspecified voice and resonance disorder: Secondary | ICD-10-CM | POA: Diagnosis not present

## 2018-12-08 DIAGNOSIS — R1310 Dysphagia, unspecified: Secondary | ICD-10-CM | POA: Diagnosis not present

## 2018-12-08 DIAGNOSIS — M1811 Unilateral primary osteoarthritis of first carpometacarpal joint, right hand: Secondary | ICD-10-CM | POA: Diagnosis not present

## 2018-12-17 DIAGNOSIS — R918 Other nonspecific abnormal finding of lung field: Secondary | ICD-10-CM | POA: Diagnosis not present

## 2018-12-17 DIAGNOSIS — J9611 Chronic respiratory failure with hypoxia: Secondary | ICD-10-CM | POA: Diagnosis not present

## 2018-12-17 DIAGNOSIS — R233 Spontaneous ecchymoses: Secondary | ICD-10-CM | POA: Diagnosis not present

## 2019-01-15 DIAGNOSIS — C44519 Basal cell carcinoma of skin of other part of trunk: Secondary | ICD-10-CM | POA: Diagnosis not present

## 2019-01-15 DIAGNOSIS — L578 Other skin changes due to chronic exposure to nonionizing radiation: Secondary | ICD-10-CM | POA: Diagnosis not present

## 2019-01-15 DIAGNOSIS — L821 Other seborrheic keratosis: Secondary | ICD-10-CM | POA: Diagnosis not present

## 2019-01-15 DIAGNOSIS — L820 Inflamed seborrheic keratosis: Secondary | ICD-10-CM | POA: Diagnosis not present

## 2019-01-15 DIAGNOSIS — L570 Actinic keratosis: Secondary | ICD-10-CM | POA: Diagnosis not present

## 2019-02-05 DIAGNOSIS — R7301 Impaired fasting glucose: Secondary | ICD-10-CM | POA: Diagnosis not present

## 2019-02-05 DIAGNOSIS — I10 Essential (primary) hypertension: Secondary | ICD-10-CM | POA: Diagnosis not present

## 2019-02-05 DIAGNOSIS — E782 Mixed hyperlipidemia: Secondary | ICD-10-CM | POA: Diagnosis not present

## 2019-02-09 DIAGNOSIS — I10 Essential (primary) hypertension: Secondary | ICD-10-CM | POA: Diagnosis not present

## 2019-02-09 DIAGNOSIS — R7301 Impaired fasting glucose: Secondary | ICD-10-CM | POA: Diagnosis not present

## 2019-02-09 DIAGNOSIS — G4733 Obstructive sleep apnea (adult) (pediatric): Secondary | ICD-10-CM | POA: Diagnosis not present

## 2019-02-09 DIAGNOSIS — R454 Irritability and anger: Secondary | ICD-10-CM | POA: Diagnosis not present

## 2019-02-09 DIAGNOSIS — E782 Mixed hyperlipidemia: Secondary | ICD-10-CM | POA: Diagnosis not present

## 2019-04-01 ENCOUNTER — Ambulatory Visit: Payer: Medicare Other | Admitting: Allergy and Immunology

## 2019-05-11 DIAGNOSIS — G4733 Obstructive sleep apnea (adult) (pediatric): Secondary | ICD-10-CM | POA: Diagnosis not present

## 2019-05-11 DIAGNOSIS — R21 Rash and other nonspecific skin eruption: Secondary | ICD-10-CM | POA: Diagnosis not present

## 2019-05-11 DIAGNOSIS — R7301 Impaired fasting glucose: Secondary | ICD-10-CM | POA: Diagnosis not present

## 2019-05-11 DIAGNOSIS — S70361A Insect bite (nonvenomous), right thigh, initial encounter: Secondary | ICD-10-CM | POA: Diagnosis not present

## 2019-05-11 DIAGNOSIS — R454 Irritability and anger: Secondary | ICD-10-CM | POA: Diagnosis not present

## 2019-05-11 DIAGNOSIS — E668 Other obesity: Secondary | ICD-10-CM | POA: Diagnosis not present

## 2019-05-11 DIAGNOSIS — M7918 Myalgia, other site: Secondary | ICD-10-CM | POA: Diagnosis not present

## 2019-05-11 DIAGNOSIS — J410 Simple chronic bronchitis: Secondary | ICD-10-CM | POA: Diagnosis not present

## 2019-05-11 DIAGNOSIS — I10 Essential (primary) hypertension: Secondary | ICD-10-CM | POA: Diagnosis not present

## 2019-05-11 DIAGNOSIS — W57XXXA Bitten or stung by nonvenomous insect and other nonvenomous arthropods, initial encounter: Secondary | ICD-10-CM | POA: Diagnosis not present

## 2019-05-11 DIAGNOSIS — E782 Mixed hyperlipidemia: Secondary | ICD-10-CM | POA: Diagnosis not present

## 2019-05-12 DIAGNOSIS — R21 Rash and other nonspecific skin eruption: Secondary | ICD-10-CM | POA: Diagnosis not present

## 2019-06-07 DIAGNOSIS — L578 Other skin changes due to chronic exposure to nonionizing radiation: Secondary | ICD-10-CM | POA: Diagnosis not present

## 2019-06-07 DIAGNOSIS — L570 Actinic keratosis: Secondary | ICD-10-CM | POA: Diagnosis not present

## 2019-06-07 DIAGNOSIS — L821 Other seborrheic keratosis: Secondary | ICD-10-CM | POA: Diagnosis not present

## 2019-08-12 DIAGNOSIS — I10 Essential (primary) hypertension: Secondary | ICD-10-CM | POA: Diagnosis not present

## 2019-08-12 DIAGNOSIS — E782 Mixed hyperlipidemia: Secondary | ICD-10-CM | POA: Diagnosis not present

## 2019-08-12 DIAGNOSIS — R7301 Impaired fasting glucose: Secondary | ICD-10-CM | POA: Diagnosis not present

## 2019-08-13 DIAGNOSIS — G4733 Obstructive sleep apnea (adult) (pediatric): Secondary | ICD-10-CM | POA: Diagnosis not present

## 2019-08-13 DIAGNOSIS — E668 Other obesity: Secondary | ICD-10-CM | POA: Diagnosis not present

## 2019-08-13 DIAGNOSIS — E782 Mixed hyperlipidemia: Secondary | ICD-10-CM | POA: Diagnosis not present

## 2019-08-13 DIAGNOSIS — I10 Essential (primary) hypertension: Secondary | ICD-10-CM | POA: Diagnosis not present

## 2019-08-13 DIAGNOSIS — R7301 Impaired fasting glucose: Secondary | ICD-10-CM | POA: Diagnosis not present

## 2019-08-13 DIAGNOSIS — R454 Irritability and anger: Secondary | ICD-10-CM | POA: Diagnosis not present

## 2019-08-13 DIAGNOSIS — M79632 Pain in left forearm: Secondary | ICD-10-CM | POA: Diagnosis not present

## 2019-08-13 DIAGNOSIS — Z122 Encounter for screening for malignant neoplasm of respiratory organs: Secondary | ICD-10-CM | POA: Diagnosis not present

## 2019-08-18 DIAGNOSIS — H26493 Other secondary cataract, bilateral: Secondary | ICD-10-CM | POA: Diagnosis not present

## 2019-08-18 DIAGNOSIS — M7712 Lateral epicondylitis, left elbow: Secondary | ICD-10-CM | POA: Diagnosis not present

## 2019-08-18 DIAGNOSIS — M65332 Trigger finger, left middle finger: Secondary | ICD-10-CM | POA: Diagnosis not present

## 2019-08-26 DIAGNOSIS — Z87891 Personal history of nicotine dependence: Secondary | ICD-10-CM | POA: Diagnosis not present

## 2019-10-01 DIAGNOSIS — Z23 Encounter for immunization: Secondary | ICD-10-CM | POA: Diagnosis not present

## 2019-10-11 DIAGNOSIS — H209 Unspecified iridocyclitis: Secondary | ICD-10-CM | POA: Diagnosis not present

## 2019-11-15 DIAGNOSIS — E782 Mixed hyperlipidemia: Secondary | ICD-10-CM | POA: Diagnosis not present

## 2019-11-15 DIAGNOSIS — R7301 Impaired fasting glucose: Secondary | ICD-10-CM | POA: Diagnosis not present

## 2019-11-16 DIAGNOSIS — R454 Irritability and anger: Secondary | ICD-10-CM | POA: Diagnosis not present

## 2019-11-16 DIAGNOSIS — Z0000 Encounter for general adult medical examination without abnormal findings: Secondary | ICD-10-CM | POA: Diagnosis not present

## 2019-11-17 DIAGNOSIS — M25552 Pain in left hip: Secondary | ICD-10-CM | POA: Diagnosis not present

## 2019-11-17 DIAGNOSIS — E782 Mixed hyperlipidemia: Secondary | ICD-10-CM | POA: Diagnosis not present

## 2019-11-17 DIAGNOSIS — I10 Essential (primary) hypertension: Secondary | ICD-10-CM | POA: Diagnosis not present

## 2019-11-17 DIAGNOSIS — J9611 Chronic respiratory failure with hypoxia: Secondary | ICD-10-CM | POA: Diagnosis not present

## 2019-11-17 DIAGNOSIS — K219 Gastro-esophageal reflux disease without esophagitis: Secondary | ICD-10-CM | POA: Diagnosis not present

## 2019-11-17 DIAGNOSIS — G4733 Obstructive sleep apnea (adult) (pediatric): Secondary | ICD-10-CM | POA: Diagnosis not present

## 2019-11-17 DIAGNOSIS — R454 Irritability and anger: Secondary | ICD-10-CM | POA: Diagnosis not present

## 2019-11-17 DIAGNOSIS — R7301 Impaired fasting glucose: Secondary | ICD-10-CM | POA: Diagnosis not present

## 2020-01-26 ENCOUNTER — Telehealth: Payer: Self-pay | Admitting: Family Medicine

## 2020-01-26 NOTE — Telephone Encounter (Signed)
Patient request refill of Losartan 100mg  1 tab a day. Patient uses [address].

## 2020-01-27 ENCOUNTER — Other Ambulatory Visit: Payer: Self-pay

## 2020-01-27 MED ORDER — LOSARTAN POTASSIUM 100 MG PO TABS: 100.0000 mg | ORAL_TABLET | Freq: Every day | ORAL | 1 refills | Status: DC

## 2020-02-08 DIAGNOSIS — M961 Postlaminectomy syndrome, not elsewhere classified: Secondary | ICD-10-CM | POA: Diagnosis not present

## 2020-02-08 DIAGNOSIS — M5431 Sciatica, right side: Secondary | ICD-10-CM | POA: Diagnosis not present

## 2020-02-08 DIAGNOSIS — M4326 Fusion of spine, lumbar region: Secondary | ICD-10-CM | POA: Diagnosis not present

## 2020-02-08 DIAGNOSIS — M4316 Spondylolisthesis, lumbar region: Secondary | ICD-10-CM | POA: Diagnosis not present

## 2020-02-16 DIAGNOSIS — G8929 Other chronic pain: Secondary | ICD-10-CM | POA: Diagnosis not present

## 2020-02-16 DIAGNOSIS — M545 Low back pain: Secondary | ICD-10-CM | POA: Diagnosis not present

## 2020-02-16 DIAGNOSIS — M47816 Spondylosis without myelopathy or radiculopathy, lumbar region: Secondary | ICD-10-CM | POA: Diagnosis not present

## 2020-02-16 DIAGNOSIS — M48061 Spinal stenosis, lumbar region without neurogenic claudication: Secondary | ICD-10-CM | POA: Diagnosis not present

## 2020-02-17 ENCOUNTER — Other Ambulatory Visit: Payer: Self-pay

## 2020-02-17 MED ORDER — SERTRALINE HCL 100 MG PO TABS: 100.0000 mg | ORAL_TABLET | Freq: Every day | ORAL | 1 refills | Status: DC

## 2020-02-21 ENCOUNTER — Other Ambulatory Visit: Payer: Self-pay | Admitting: Family Medicine

## 2020-02-21 ENCOUNTER — Encounter: Payer: Self-pay | Admitting: Family Medicine

## 2020-02-21 ENCOUNTER — Ambulatory Visit (INDEPENDENT_AMBULATORY_CARE_PROVIDER_SITE_OTHER): Payer: Medicare Other | Admitting: Family Medicine

## 2020-02-21 ENCOUNTER — Other Ambulatory Visit: Payer: Self-pay

## 2020-02-21 VITALS — BP 124/68 | HR 74 | Temp 98.1°F | Ht 69.0 in | Wt 192.8 lb

## 2020-02-21 DIAGNOSIS — Z23 Encounter for immunization: Secondary | ICD-10-CM | POA: Diagnosis not present

## 2020-02-21 DIAGNOSIS — E782 Mixed hyperlipidemia: Secondary | ICD-10-CM

## 2020-02-21 DIAGNOSIS — F330 Major depressive disorder, recurrent, mild: Secondary | ICD-10-CM

## 2020-02-21 DIAGNOSIS — R7301 Impaired fasting glucose: Secondary | ICD-10-CM | POA: Diagnosis not present

## 2020-02-21 DIAGNOSIS — J9611 Chronic respiratory failure with hypoxia: Secondary | ICD-10-CM

## 2020-02-21 DIAGNOSIS — M48062 Spinal stenosis, lumbar region with neurogenic claudication: Secondary | ICD-10-CM | POA: Diagnosis not present

## 2020-02-21 DIAGNOSIS — I10 Essential (primary) hypertension: Secondary | ICD-10-CM

## 2020-02-21 MED ORDER — PRAVASTATIN SODIUM 20 MG PO TABS: 20.0000 mg | ORAL_TABLET | Freq: Every day | ORAL | 1 refills | Status: DC

## 2020-02-21 MED ORDER — SERTRALINE HCL 100 MG PO TABS: 100.0000 mg | ORAL_TABLET | Freq: Every day | ORAL | 3 refills | Status: DC

## 2020-02-21 MED ORDER — CETIRIZINE HCL 10 MG PO TABS: 10.0000 mg | ORAL_TABLET | Freq: Every day | ORAL | 3 refills | Status: DC

## 2020-02-21 NOTE — Progress Notes (Signed)
Subjective:  Patient ID: William Clay, male    DOB: 02/01/1941  Age: 79 y.o. MRN: [id number]  Chief Complaint  Patient presents with  . Hyperlipidemia  . Hypertension  . Gastroesophageal Reflux  . COPD  . prediabetes    HPI Patient is a 79 year old white male with history of hyperlipidemia, hypertension, GERD, COPD, and prediabetes.  Patient is eating healthy.  He is not exercising currently.  His exercise is limited due to his respiratory issues.  He did injure his back approximately 3 weeks ago and is following up with [facility].  He is scheduled for an MRI.  For his COPD he is currently on Stiolto, Singulair, and DuoNeb treatments he also uses 2 L of oxygen at night. The patient's hypertension is well controlled on amlodipine and losartan.   Patient is also on Zoloft for atypical depression.  This works well.   Social History   Socioeconomic History  . Marital status: Married    Spouse name: Not on file  . Number of children: 2  . Years of education: Not on file  . Highest education level: Not on file  Occupational History  . Occupation: retired    Comment: Event organiser  Tobacco Use  . Smoking status: Former Smoker    Packs/day: 2.00    Years: 50.00    Pack years: 100.00    Types: Cigarettes    Quit date: 12/24/2007    Years since quitting: 12.1  . Smokeless tobacco: Never Used  Substance and Sexual Activity  . Alcohol use: No    Alcohol/week: 0.0 standard drinks  . Drug use: No  . Sexual activity: Not on file  Other Topics Concern  . Not on file  Social History Narrative  . Not on file   Social Determinants of Health   Financial Resource Strain:   . Difficulty of Paying Living Expenses: Not on file  Food Insecurity:   . Worried About Charity fundraiser in the Last Year: Not on file  . Ran Out of Food in the Last Year: Not on file  Transportation Needs:   . Lack of Transportation (Medical): Not on file  . Lack of Transportation  (Non-Medical): Not on file  Physical Activity:   . Days of Exercise per Week: Not on file  . Minutes of Exercise per Session: Not on file  Stress:   . Feeling of Stress : Not on file  Social Connections:   . Frequency of Communication with Friends and Family: Not on file  . Frequency of Social Gatherings with Friends and Family: Not on file  . Attends Religious Services: Not on file  . Active Member of Clubs or Organizations: Not on file  . Attends Archivist Meetings: Not on file  . Marital Status: Not on file   Past Medical History:  Diagnosis Date  . Atherosclerosis of aorta ([place])   . Bowel obstruction ([place]) 2018  . BPH (benign prostatic hyperplasia)   . COPD (chronic obstructive pulmonary disease) ([place])   . COPD (chronic obstructive pulmonary disease) ([place])   . GERD (gastroesophageal reflux disease)   . Hypertension   . Hypoxemia   . Mixed hyperlipidemia   . Osteoarthritis   . Pneumonia [id number]  . Primary insomnia   . Sleep apnea   . Spontaneous ecchymoses    Family History  Problem Relation Age of Onset  . Heart failure Mother   . Diabetes Mother   . CAD Mother   .  Hypertension Mother     Review of Systems  Constitutional: Negative for chills, fatigue and fever.  HENT: Negative for congestion, ear pain and sore throat.   Respiratory: Negative for cough and shortness of breath.   Cardiovascular: Negative for chest pain.  Gastrointestinal: Negative for abdominal pain, constipation, diarrhea, nausea and vomiting.  Endocrine: Negative for polydipsia, polyphagia and polyuria.  Genitourinary: Negative for dysuria and frequency.  Musculoskeletal: Positive for back pain. Negative for arthralgias and myalgias.  Neurological: Negative for dizziness and headaches.  Psychiatric/Behavioral: Negative for dysphoric mood.       No dysphoria     Objective:  BP 124/68 (BP Location: Left Arm, Patient Position: Sitting)   Pulse 74   Temp 98.1 F (36.7 C)  (Temporal)   Ht 5\' 9"  (1.753 m)   Wt 192 lb 12.8 oz (87.5 kg)   SpO2 99%   BMI 28.47 kg/m   BP/Weight 02/21/2020 11/30/2018 [id number]  Systolic BP [id number]  Diastolic BP 68 90 80  Wt. (Lbs) 192.8 - -  BMI 28.47 - -    Physical Exam Vitals reviewed.  Constitutional:      Appearance: Normal appearance.  Neck:     Vascular: No carotid bruit.  Cardiovascular:     Rate and Rhythm: Normal rate and regular rhythm.     Pulses: Normal pulses.     Heart sounds: Normal heart sounds.  Pulmonary:     Effort: Pulmonary effort is normal.     Breath sounds: Normal breath sounds. No wheezing, rhonchi or rales.  Abdominal:     General: Bowel sounds are normal.     Palpations: Abdomen is soft.     Tenderness: There is no abdominal tenderness.  Neurological:     Mental Status: He is alert.  Psychiatric:        Mood and Affect: Mood normal.        Behavior: Behavior normal.     No results found for: WBC, HGB, HCT, PLT, GLUCOSE, CHOL, TRIG, HDL, LDLDIRECT, LDLCALC, ALT, AST, NA, K, CL, CREATININE, BUN, CO2, TSH, PSA, INR, GLUF, HGBA1C, MICROALBUR    Assessment & Plan:   Essential hypertension Well controlled.  No changes to medicines.  Continue to work on eating a healthy diet and exercise.  Labs drawn today.   Chronic respiratory failure with hypoxia (HCC) Continue oxygen at night.  If shortness of breath occurs during the day patient was encouraged to use his oxygen starting at 2 L.  Lumbar stenosis with neurogenic claudication Recommend patient keep follow-up with spine and sc[facility] center  Impaired fasting glucose Well controlled.  No changes to medicines.  Continue to work on eating a healthy diet and exercise.  Labs drawn today.   Mild recurrent major depression ([place]) Well controlled. The current medical regimen is effective;  continue present plan and medications.  Mixed hyperlipidemia Well controlled.  No changes to medicines.  Continue to work on eating a  healthy diet and exercise.  Labs drawn today.   Orders Placed This Encounter  Procedures  . CBC with Differential/Platelet  . Hemoglobin A1c  . Lipid panel  . Comp. Metabolic Panel (12)  . Cardiovascular Risk Assessment    Meds ordered this encounter  Medications  . cetirizine (ZYRTEC) 10 MG tablet    Sig: Take 1 tablet (10 mg total) by mouth daily.    Dispense:  90 tablet    Refill:  3  . sertraline (ZOLOFT) 100 MG tablet    Sig: Take  1 tablet (100 mg total) by mouth daily.    Dispense:  90 tablet    Refill:  3   Follow-up: Return in about 3 months (around 05/23/2020).  AVS was given to patient prior to departure.  [name] [facility] [phone]

## 2020-02-21 NOTE — Patient Instructions (Signed)
Continue current medications.  Medical issues fairly well controlled.  Continue to eat healthy.  Encouraging exercise.  Follow-up in 3 months.

## 2020-02-22 LAB — COMP. METABOLIC PANEL (12)
AST: 31 IU/L (ref 0–40)
Albumin/Globulin Ratio: 1.7 (ref 1.2–2.2)
Albumin: 4.3 g/dL (ref 3.7–4.7)
Alkaline Phosphatase: 86 IU/L (ref 39–117)
BUN/Creatinine Ratio: 12 (ref 10–24)
BUN: 11 mg/dL (ref 8–27)
Bilirubin Total: 0.7 mg/dL (ref 0.0–1.2)
Calcium: 9.8 mg/dL (ref 8.6–10.2)
Chloride: 105 mmol/L (ref 96–106)
Creatinine, Ser: 0.94 mg/dL (ref 0.76–1.27)
GFR calc Af Amer: 91 mL/min/{1.73_m2} (ref >59)
GFR calc non Af Amer: 78 mL/min/{1.73_m2} (ref >59)
Globulin, Total: 2.5 g/dL (ref 1.5–4.5)
Glucose: 89 mg/dL (ref 65–99)
Potassium: 4.6 mmol/L (ref 3.5–5.2)
Sodium: 140 mmol/L (ref 134–144)
Total Protein: 6.8 g/dL (ref 6.0–8.5)

## 2020-02-22 LAB — CBC WITH DIFFERENTIAL/PLATELET
Basophils Absolute: 0.1 10*3/uL (ref 0.0–0.2)
Basos: 1 %
EOS (ABSOLUTE): 0.1 10*3/uL (ref 0.0–0.4)
Eos: 1 %
Hematocrit: 45.6 % (ref 37.5–51.0)
Hemoglobin: 15.2 g/dL (ref 13.0–17.7)
Immature Grans (Abs): 0.1 10*3/uL (ref 0.0–0.1)
Immature Granulocytes: 1 %
Lymphocytes Absolute: 1.3 10*3/uL (ref 0.7–3.1)
Lymphs: 13 %
MCH: 32.3 pg (ref 26.6–33.0)
MCHC: 33.3 g/dL (ref 31.5–35.7)
MCV: 97 fL (ref 79–97)
Monocytes Absolute: 1.2 10*3/uL — ABNORMAL HIGH (ref 0.1–0.9)
Monocytes: 11 %
Neutrophils Absolute: 7.8 10*3/uL — ABNORMAL HIGH (ref 1.4–7.0)
Neutrophils: 73 %
Platelets: 174 10*3/uL (ref 150–450)
RBC: 4.71 x10E6/uL (ref 4.14–5.80)
RDW: 12.4 % (ref 11.6–15.4)
WBC: 10.5 10*3/uL (ref 3.4–10.8)

## 2020-02-22 LAB — HEMOGLOBIN A1C
Est. average glucose Bld gHb Est-mCnc: 105 mg/dL
Hgb A1c MFr Bld: 5.3 % (ref 4.8–5.6)

## 2020-02-22 LAB — LIPID PANEL
Chol/HDL Ratio: 4.4 ratio (ref 0.0–5.0)
Cholesterol, Total: 185 mg/dL (ref 100–199)
HDL: 42 mg/dL (ref >39)
LDL Chol Calc (NIH): 98 mg/dL (ref 0–99)
Triglycerides: 269 mg/dL — ABNORMAL HIGH (ref 0–149)
VLDL Cholesterol Cal: 45 mg/dL — ABNORMAL HIGH (ref 5–40)

## 2020-02-22 LAB — CARDIOVASCULAR RISK ASSESSMENT

## 2020-02-25 ENCOUNTER — Encounter: Payer: Self-pay | Admitting: Family Medicine

## 2020-02-25 DIAGNOSIS — E782 Mixed hyperlipidemia: Secondary | ICD-10-CM | POA: Insufficient documentation

## 2020-02-25 DIAGNOSIS — F330 Major depressive disorder, recurrent, mild: Secondary | ICD-10-CM | POA: Insufficient documentation

## 2020-02-25 DIAGNOSIS — R7301 Impaired fasting glucose: Secondary | ICD-10-CM | POA: Insufficient documentation

## 2020-02-25 NOTE — Assessment & Plan Note (Signed)
Well controlled.  The current medical regimen is effective;  continue present plan and medications. 

## 2020-02-25 NOTE — Assessment & Plan Note (Signed)
Well controlled.  ?No changes to medicines.  ?Continue to work on eating a healthy diet and exercise.  ?Labs drawn today.  ?

## 2020-02-25 NOTE — Assessment & Plan Note (Signed)
Continue oxygen at night.  If shortness of breath occurs during the day patient was encouraged to use his oxygen starting at 2 L.

## 2020-02-25 NOTE — Assessment & Plan Note (Signed)
Recommend patient keep follow-up with [facility]

## 2020-02-29 ENCOUNTER — Encounter: Payer: Self-pay | Admitting: Physician Assistant

## 2020-02-29 ENCOUNTER — Ambulatory Visit (INDEPENDENT_AMBULATORY_CARE_PROVIDER_SITE_OTHER): Payer: Medicare Other | Admitting: Physician Assistant

## 2020-02-29 ENCOUNTER — Other Ambulatory Visit: Payer: Self-pay

## 2020-02-29 VITALS — BP 138/82 | HR 68 | Temp 98.4°F | Resp 16 | Wt 192.0 lb

## 2020-02-29 DIAGNOSIS — R918 Other nonspecific abnormal finding of lung field: Secondary | ICD-10-CM | POA: Diagnosis not present

## 2020-02-29 DIAGNOSIS — J449 Chronic obstructive pulmonary disease, unspecified: Secondary | ICD-10-CM

## 2020-02-29 DIAGNOSIS — G9589 Other specified diseases of spinal cord: Secondary | ICD-10-CM | POA: Diagnosis not present

## 2020-02-29 DIAGNOSIS — M48062 Spinal stenosis, lumbar region with neurogenic claudication: Secondary | ICD-10-CM

## 2020-02-29 DIAGNOSIS — Z01810 Encounter for preprocedural cardiovascular examination: Secondary | ICD-10-CM

## 2020-02-29 DIAGNOSIS — M961 Postlaminectomy syndrome, not elsewhere classified: Secondary | ICD-10-CM | POA: Diagnosis not present

## 2020-02-29 DIAGNOSIS — Z01818 Encounter for other preprocedural examination: Secondary | ICD-10-CM | POA: Diagnosis not present

## 2020-02-29 DIAGNOSIS — Z0000 Encounter for general adult medical examination without abnormal findings: Secondary | ICD-10-CM | POA: Insufficient documentation

## 2020-02-29 DIAGNOSIS — M5431 Sciatica, right side: Secondary | ICD-10-CM | POA: Diagnosis not present

## 2020-02-29 DIAGNOSIS — M4326 Fusion of spine, lumbar region: Secondary | ICD-10-CM | POA: Diagnosis not present

## 2020-02-29 DIAGNOSIS — M4316 Spondylolisthesis, lumbar region: Secondary | ICD-10-CM | POA: Diagnosis not present

## 2020-02-29 NOTE — Assessment & Plan Note (Signed)
>>  ASSESSMENT AND PLAN FOR CHRONIC OBSTRUCTIVE PULMONARY DISEASE (HCC) WRITTEN ON 02/29/2020 12:24 PM BY DAVIS, SARA, PA-C  Chest xray pending Pt to also follow up with his pulmonologist Dr Shelle Iron for clearance

## 2020-02-29 NOTE — Assessment & Plan Note (Signed)
Follow up with neurosurgeon as directed

## 2020-02-29 NOTE — Progress Notes (Signed)
Acute Office Visit  Subjective:    Patient ID: William Clay, male    DOB: [birth year], 79 y.o.   MRN: [id number]  Chief Complaint  Patient presents with  . Back Pain    HPI Patient is in today for follow up of chronic back pain and patient getting pre-op clearance for [facility] to schedule a revision of L3-5 fusion Besides his back pain pt voices no other concerns or problems and recently had chronic follow up visit and labwork which was all stable  Past Medical History:  Diagnosis Date  . Atherosclerosis of aorta ([place])   . Bowel obstruction ([place]) 2018  . BPH (benign prostatic hyperplasia)   . COPD (chronic obstructive pulmonary disease) ([place])   . COPD (chronic obstructive pulmonary disease) ([place])   . GERD (gastroesophageal reflux disease)   . Hypertension   . Hypoxemia   . Mixed hyperlipidemia   . Osteoarthritis   . Pneumonia [id number]  . Primary insomnia   . Sleep apnea   . Spontaneous ecchymoses     Past Surgical History:  Procedure Laterality Date  . ABDOMINAL AORTIC ANEURYSM REPAIR    . BACK SURGERY N/A [id number]   Lower back  . BACK SURGERY  1984   upper back  . CATARACT EXTRACTION Left 2017  . CATARACT EXTRACTION Right 2017  . CHOLECYSTECTOMY  2018  . HERNIA REPAIR  2018  . PROSTATECTOMY    . ROTATOR CUFF REPAIR Right 2015  . ROTATOR CUFF REPAIR Left 2017    Family History  Problem Relation Age of Onset  . Heart failure Mother   . Diabetes Mother   . CAD Mother   . Hypertension Mother     Social History   Socioeconomic History  . Marital status: Married    Spouse name: Not on file  . Number of children: 2  . Years of education: Not on file  . Highest education level: Not on file  Occupational History  . Occupation: retired    Comment: Event organiser  Tobacco Use  . Smoking status: Former Smoker    Packs/day: 2.00    Years: 50.00    Pack years: 100.00    Types: Cigarettes    Quit date:  12/24/2007    Years since quitting: 12.1  . Smokeless tobacco: Never Used  Substance and Sexual Activity  . Alcohol use: No    Alcohol/week: 0.0 standard drinks  . Drug use: No  . Sexual activity: Not on file  Other Topics Concern  . Not on file  Social History Narrative  . Not on file   Social Determinants of Health   Financial Resource Strain:   . Difficulty of Paying Living Expenses: Not on file  Food Insecurity:   . Worried About Charity fundraiser in the Last Year: Not on file  . Ran Out of Food in the Last Year: Not on file  Transportation Needs:   . Lack of Transportation (Medical): Not on file  . Lack of Transportation (Non-Medical): Not on file  Physical Activity:   . Days of Exercise per Week: Not on file  . Minutes of Exercise per Session: Not on file  Stress:   . Feeling of Stress : Not on file  Social Connections:   . Frequency of Communication with Friends and Family: Not on file  . Frequency of Social Gatherings with Friends and Family: Not on file  . Attends Religious Services: Not on file  . Active Member  of Clubs or Organizations: Not on file  . Attends Archivist Meetings: Not on file  . Marital Status: Not on file  Intimate Partner Violence:   . Fear of Current or Ex-Partner: Not on file  . Emotionally Abused: Not on file  . Physically Abused: Not on file  . Sexually Abused: Not on file     Current Outpatient Medications:  .  albuterol (PROVENTIL HFA;VENTOLIN HFA) 108 (90 Base) MCG/ACT inhaler, Inhale two puffs every four to six hours as needed for cough or wheeze., Disp: 1 Inhaler, Rfl: 1 .  amLODipine (NORVASC) 10 MG tablet, Take 10 mg by mouth daily., Disp: , Rfl:  .  cetirizine (ZYRTEC) 10 MG tablet, Take 1 tablet (10 mg total) by mouth daily., Disp: 90 tablet, Rfl: 3 .  losartan (COZAAR) 100 MG tablet, Take 1 tablet (100 mg total) by mouth daily., Disp: 90 tablet, Rfl: 1 .  meloxicam (MOBIC) 15 MG tablet, Take 15 mg by mouth daily.,  Disp: , Rfl:  .  Multiple Vitamins-Minerals (MULTIVITAMIN WITH MINERALS) tablet, Take 1 tablet by mouth daily., Disp: , Rfl:  .  Omega-3 Fatty Acids (FISH OIL) 1000 MG CAPS, Take 1,000 mg by mouth 2 (two) times daily., Disp: , Rfl:  .  omeprazole (PRILOSEC) 40 MG capsule, Take 40 mg by mouth daily., Disp: , Rfl:  .  pravastatin (PRAVACHOL) 20 MG tablet, Take 1 tablet (20 mg total) by mouth daily., Disp: 90 tablet, Rfl: 1 .  Probiotic Product (RESTORA PO), Take 1 tablet by mouth daily., Disp: , Rfl:  .  sertraline (ZOLOFT) 100 MG tablet, Take 1 tablet (100 mg total) by mouth daily., Disp: 90 tablet, Rfl: 3 .  Tiotropium Bromide-Olodaterol (STIOLTO RESPIMAT) 2.5-2.5 MCG/ACT AERS, Inhale into the lungs., Disp: , Rfl:    Allergies  Allergen Reactions  . Lisinopril Other (See Comments)    Dyspnea Dyspnea   . Simvastatin Other (See Comments)    Muscle Cramps Muscle Cramps   . Amoxicillin     Mouth sores   . Gabapentin   . Potassium Clavulanate [Clavulanic Acid]     diarrhea    CONSTITUTIONAL: Negative for chills, fatigue, fever, unintentional weight gain and unintentional weight loss.  E/N/T: Negative for ear pain, nasal congestion and sore throat.  CARDIOVASCULAR: Negative for chest pain, dizziness, palpitations and pedal edema.  RESPIRATORY: Negative for recent cough and dyspnea.  GASTROINTESTINAL: Negative for abdominal pain, acid reflux symptoms, constipation, diarrhea, nausea and vomiting.  MSK: see HPI INTEGUMENTARY: Negative for rash.  NEUROLOGICAL: Negative for dizziness and headaches.  PSYCHIATRIC: Negative for sleep disturbance and to question depression screen.  Negative for depression, negative for anhedonia.         Objective:    PHYSICAL EXAM:   VS: BP 138/82   Pulse 68   Temp 98.4 F (36.9 C)   Resp 16   Wt 192 lb (87.1 kg)   SpO2 95%   BMI 28.35 kg/m   GEN: Well nourished, well developed, in no acute distress  HEENT: normal external ears and nose -  normal external auditory canals and TMS - hearing grossly normal - normal nasal mucosa and septum - Lips, Teeth and Gums - normal  Oropharynx - normal mucosa, palate, and posterior pharynx Neck: no JVD or masses - no thyromegaly Cardiac: RRR; no murmurs, rubs, or gallops,no edema - no significant varicosities Respiratory:  normal respiratory rate and pattern with no distress - normal breath sounds with no rales, rhonchi, wheezes or  rubs  Skin: warm and dry, no rash  Neuro:  Alert and Oriented x 3, Strength and sensation are intact - CN II-Xii grossly intact Psych: euthymic mood, appropriate affect and demeanor EKG - no acute abnormalities  Wt Readings from Last 3 Encounters:  02/29/20 192 lb (87.1 kg)  02/21/20 192 lb 12.8 oz (87.5 kg)  09/03/18 206 lb 3.2 oz (93.5 kg)    Health Maintenance Due  Topic Date Due  . TETANUS/TDAP  09/24/1962  . PNA vac Low Risk Adult (2 of 2 - PPSV23) 10/24/2015    There are no preventive care reminders to display for this patient.        Assessment & Plan:   Problem List Items Addressed This Visit    None    Visit Diagnoses    Pre-procedural cardiovascular examination    -  Primary   Relevant Orders   EKG 12-Lead (Completed)   DG Chest 2 View   Chronic obstructive pulmonary disease, unspecified COPD type ([place])       Relevant Orders   DG Chest 2 View       No orders of the defined types were placed in this encounter.    SARA R DAVIS, PA-C

## 2020-02-29 NOTE — Assessment & Plan Note (Signed)
EKG stable

## 2020-02-29 NOTE — Assessment & Plan Note (Signed)
Chest xray pending Pt to also follow up with his pulmonologist Dr Gwenette Greet for clearance

## 2020-03-20 DIAGNOSIS — Z23 Encounter for immunization: Secondary | ICD-10-CM | POA: Diagnosis not present

## 2020-03-27 DIAGNOSIS — M4316 Spondylolisthesis, lumbar region: Secondary | ICD-10-CM | POA: Diagnosis not present

## 2020-03-27 DIAGNOSIS — M961 Postlaminectomy syndrome, not elsewhere classified: Secondary | ICD-10-CM | POA: Diagnosis not present

## 2020-03-27 DIAGNOSIS — M5431 Sciatica, right side: Secondary | ICD-10-CM | POA: Diagnosis not present

## 2020-03-27 DIAGNOSIS — M4726 Other spondylosis with radiculopathy, lumbar region: Secondary | ICD-10-CM | POA: Diagnosis not present

## 2020-03-27 DIAGNOSIS — Z4689 Encounter for fitting and adjustment of other specified devices: Secondary | ICD-10-CM | POA: Diagnosis not present

## 2020-03-30 ENCOUNTER — Telehealth: Payer: Self-pay | Admitting: Family Medicine

## 2020-03-30 NOTE — Progress Notes (Signed)
  Chronic Care Management   Note  03/30/2020 Name: ADRIENNE WINDERS MRN: [id number] DOB: 1941-02-08  TYONNE PUERTAS is a 79 y.o. year old male who is a primary care patient of Cox, Kirsten, MD. I reached out to [name] by phone today in response to a referral sent by Mr. Dewain Penning PCP, Cox, Kirsten, MD.   Mr. Brueckner was given information about Chronic Care Management services today including:  1. CCM service includes personalized support from designated clinical staff supervised by his physician, including individualized plan of care and coordination with other care providers 2. 24/7 contact phone numbers for assistance for urgent and routine care needs. 3. Service will only be billed when office clinical staff spend 20 minutes or more in a month to coordinate care. 4. Only one practitioner may furnish and bill the service in a calendar month. 5. The patient may stop CCM services at any time (effective at the end of the month) by phone call to the office staff.   Patient agreed to services and verbal consent obtained.   Follow up plan:   Earney [facility] Scheduler

## 2020-03-30 NOTE — Progress Notes (Signed)
  Chronic Care Management   Outreach Note  03/30/2020 Name: William Clay MRN: [id number] DOB: 11/12/1941  Referred by: [name], MD Reason for referral : No chief complaint on file.   An unsuccessful telephone outreach was attempted today. The patient was referred to the pharmacist for assistance with care management and care coordination.   Follow Up Plan:   Earney [place] [facility] Scheduler

## 2020-04-03 DIAGNOSIS — Z01812 Encounter for preprocedural laboratory examination: Secondary | ICD-10-CM | POA: Diagnosis not present

## 2020-04-03 DIAGNOSIS — M4316 Spondylolisthesis, lumbar region: Secondary | ICD-10-CM | POA: Diagnosis not present

## 2020-04-03 DIAGNOSIS — M5136 Other intervertebral disc degeneration, lumbar region: Secondary | ICD-10-CM | POA: Diagnosis not present

## 2020-04-03 DIAGNOSIS — Z20822 Contact with and (suspected) exposure to covid-19: Secondary | ICD-10-CM | POA: Diagnosis not present

## 2020-04-10 DIAGNOSIS — M961 Postlaminectomy syndrome, not elsewhere classified: Secondary | ICD-10-CM | POA: Diagnosis not present

## 2020-04-10 DIAGNOSIS — Z981 Arthrodesis status: Secondary | ICD-10-CM | POA: Diagnosis not present

## 2020-04-10 DIAGNOSIS — M48061 Spinal stenosis, lumbar region without neurogenic claudication: Secondary | ICD-10-CM | POA: Diagnosis not present

## 2020-04-10 DIAGNOSIS — M47817 Spondylosis without myelopathy or radiculopathy, lumbosacral region: Secondary | ICD-10-CM | POA: Diagnosis not present

## 2020-04-10 DIAGNOSIS — M5432 Sciatica, left side: Secondary | ICD-10-CM | POA: Diagnosis not present

## 2020-04-10 DIAGNOSIS — I10 Essential (primary) hypertension: Secondary | ICD-10-CM | POA: Diagnosis not present

## 2020-04-10 DIAGNOSIS — M4807 Spinal stenosis, lumbosacral region: Secondary | ICD-10-CM | POA: Diagnosis not present

## 2020-04-10 DIAGNOSIS — Z87891 Personal history of nicotine dependence: Secondary | ICD-10-CM | POA: Diagnosis not present

## 2020-04-10 DIAGNOSIS — M4326 Fusion of spine, lumbar region: Secondary | ICD-10-CM | POA: Diagnosis not present

## 2020-04-10 DIAGNOSIS — M4726 Other spondylosis with radiculopathy, lumbar region: Secondary | ICD-10-CM | POA: Diagnosis not present

## 2020-04-10 DIAGNOSIS — M4316 Spondylolisthesis, lumbar region: Secondary | ICD-10-CM | POA: Diagnosis not present

## 2020-04-10 DIAGNOSIS — J449 Chronic obstructive pulmonary disease, unspecified: Secondary | ICD-10-CM | POA: Diagnosis not present

## 2020-04-10 DIAGNOSIS — K219 Gastro-esophageal reflux disease without esophagitis: Secondary | ICD-10-CM | POA: Diagnosis not present

## 2020-04-10 DIAGNOSIS — M5431 Sciatica, right side: Secondary | ICD-10-CM | POA: Diagnosis not present

## 2020-04-11 DIAGNOSIS — M4326 Fusion of spine, lumbar region: Secondary | ICD-10-CM | POA: Diagnosis not present

## 2020-04-12 DIAGNOSIS — I10 Essential (primary) hypertension: Secondary | ICD-10-CM | POA: Diagnosis present

## 2020-04-12 DIAGNOSIS — J449 Chronic obstructive pulmonary disease, unspecified: Secondary | ICD-10-CM | POA: Diagnosis present

## 2020-04-12 DIAGNOSIS — G8918 Other acute postprocedural pain: Secondary | ICD-10-CM | POA: Diagnosis not present

## 2020-04-12 DIAGNOSIS — Z981 Arthrodesis status: Secondary | ICD-10-CM | POA: Insufficient documentation

## 2020-04-12 DIAGNOSIS — M4316 Spondylolisthesis, lumbar region: Secondary | ICD-10-CM | POA: Diagnosis present

## 2020-04-12 DIAGNOSIS — E785 Hyperlipidemia, unspecified: Secondary | ICD-10-CM | POA: Diagnosis present

## 2020-04-12 DIAGNOSIS — M5416 Radiculopathy, lumbar region: Secondary | ICD-10-CM | POA: Diagnosis present

## 2020-04-12 DIAGNOSIS — D62 Acute posthemorrhagic anemia: Secondary | ICD-10-CM | POA: Diagnosis not present

## 2020-04-12 DIAGNOSIS — Z9989 Dependence on other enabling machines and devices: Secondary | ICD-10-CM | POA: Insufficient documentation

## 2020-04-12 DIAGNOSIS — Z7409 Other reduced mobility: Secondary | ICD-10-CM | POA: Diagnosis not present

## 2020-04-12 DIAGNOSIS — K219 Gastro-esophageal reflux disease without esophagitis: Secondary | ICD-10-CM | POA: Insufficient documentation

## 2020-04-12 DIAGNOSIS — E669 Obesity, unspecified: Secondary | ICD-10-CM | POA: Diagnosis present

## 2020-04-12 DIAGNOSIS — D649 Anemia, unspecified: Secondary | ICD-10-CM | POA: Diagnosis not present

## 2020-04-12 DIAGNOSIS — I2510 Atherosclerotic heart disease of native coronary artery without angina pectoris: Secondary | ICD-10-CM | POA: Diagnosis present

## 2020-04-12 DIAGNOSIS — R269 Unspecified abnormalities of gait and mobility: Secondary | ICD-10-CM | POA: Diagnosis present

## 2020-04-12 DIAGNOSIS — G4733 Obstructive sleep apnea (adult) (pediatric): Secondary | ICD-10-CM | POA: Insufficient documentation

## 2020-04-12 DIAGNOSIS — Z6830 Body mass index (BMI) 30.0-30.9, adult: Secondary | ICD-10-CM | POA: Diagnosis not present

## 2020-04-12 DIAGNOSIS — M48062 Spinal stenosis, lumbar region with neurogenic claudication: Secondary | ICD-10-CM | POA: Diagnosis present

## 2020-04-12 DIAGNOSIS — M961 Postlaminectomy syndrome, not elsewhere classified: Secondary | ICD-10-CM | POA: Diagnosis present

## 2020-04-12 DIAGNOSIS — H905 Unspecified sensorineural hearing loss: Secondary | ICD-10-CM | POA: Insufficient documentation

## 2020-04-12 DIAGNOSIS — D72829 Elevated white blood cell count, unspecified: Secondary | ICD-10-CM | POA: Diagnosis not present

## 2020-04-12 DIAGNOSIS — Z789 Other specified health status: Secondary | ICD-10-CM | POA: Diagnosis not present

## 2020-04-12 DIAGNOSIS — J9611 Chronic respiratory failure with hypoxia: Secondary | ICD-10-CM | POA: Diagnosis present

## 2020-04-12 DIAGNOSIS — M4326 Fusion of spine, lumbar region: Secondary | ICD-10-CM | POA: Diagnosis not present

## 2020-04-12 DIAGNOSIS — Z4789 Encounter for other orthopedic aftercare: Secondary | ICD-10-CM | POA: Diagnosis not present

## 2020-04-12 DIAGNOSIS — F329 Major depressive disorder, single episode, unspecified: Secondary | ICD-10-CM | POA: Diagnosis present

## 2020-04-12 MED ORDER — POLYETHYLENE GLYCOL 3350 17 GM/SCOOP PO POWD
17.00 | ORAL | Status: DC
Start: 2020-04-25 — End: 2020-04-12

## 2020-04-12 MED ORDER — SENNOSIDES-DOCUSATE SODIUM 8.6-50 MG PO TABS
1.00 | ORAL_TABLET | ORAL | Status: DC
Start: 2020-04-24 — End: 2020-04-12

## 2020-04-12 MED ORDER — METHOCARBAMOL 500 MG PO TABS
500.00 | ORAL_TABLET | ORAL | Status: DC
Start: 2020-04-14 — End: 2020-04-12

## 2020-04-12 MED ORDER — UMECLIDINIUM-VILANTEROL 62.5-25 MCG/INH IN AEPB
1.00 | INHALATION_SPRAY | RESPIRATORY_TRACT | Status: DC
Start: 2020-04-13 — End: 2020-04-12

## 2020-04-12 MED ORDER — CETIRIZINE HCL 10 MG PO TABS
10.00 | ORAL_TABLET | ORAL | Status: DC
Start: 2020-04-25 — End: 2020-04-12

## 2020-04-12 MED ORDER — PANTOPRAZOLE SODIUM 40 MG PO TBEC
40.00 | DELAYED_RELEASE_TABLET | ORAL | Status: DC
Start: 2020-04-25 — End: 2020-04-12

## 2020-04-12 MED ORDER — ALBUTEROL SULFATE HFA 108 (90 BASE) MCG/ACT IN AERS
2.00 | INHALATION_SPRAY | RESPIRATORY_TRACT | Status: DC
Start: ? — End: 2020-04-12

## 2020-04-12 MED ORDER — TAMSULOSIN HCL 0.4 MG PO CAPS
0.40 | ORAL_CAPSULE | ORAL | Status: DC
Start: 2020-04-25 — End: 2020-04-12

## 2020-04-12 MED ORDER — HYDROCODONE-ACETAMINOPHEN 5-325 MG PO TABS
1.00 | ORAL_TABLET | ORAL | Status: DC
Start: ? — End: 2020-04-12

## 2020-04-12 MED ORDER — SODIUM CHLORIDE 0.9 % IV SOLN
INTRAVENOUS | Status: DC
Start: 2020-04-12 — End: 2020-04-12

## 2020-04-12 MED ORDER — PHENYLEPHRINE-GUAIFENESIN 30-400 MG PO CP12
10.00 | ORAL_CAPSULE | ORAL | Status: DC
Start: 2020-04-12 — End: 2020-04-12

## 2020-04-12 MED ORDER — LOSARTAN POTASSIUM 50 MG PO TABS
100.00 | ORAL_TABLET | ORAL | Status: DC
Start: 2020-04-25 — End: 2020-04-12

## 2020-04-12 MED ORDER — SERTRALINE HCL 100 MG PO TABS
100.00 | ORAL_TABLET | ORAL | Status: DC
Start: 2020-04-25 — End: 2020-04-12

## 2020-04-12 MED ORDER — PRAVASTATIN SODIUM 10 MG PO TABS
20.00 | ORAL_TABLET | ORAL | Status: DC
Start: 2020-04-24 — End: 2020-04-12

## 2020-04-12 MED ORDER — PHENOL 1.4 % MT LIQD
1.00 | OROMUCOSAL | Status: DC
Start: ? — End: 2020-04-12

## 2020-04-12 MED ORDER — ONDANSETRON 4 MG PO TBDP
4.00 | ORAL_TABLET | ORAL | Status: DC
Start: ? — End: 2020-04-12

## 2020-04-12 MED ORDER — ACETAMINOPHEN 325 MG PO TABS
650.00 | ORAL_TABLET | ORAL | Status: DC
Start: 2020-04-24 — End: 2020-04-12

## 2020-04-12 MED ORDER — SORBITOL 70 % PO SOLN
30.00 | ORAL | Status: DC
Start: ? — End: 2020-04-12

## 2020-04-12 MED ORDER — HYDROCODONE-ACETAMINOPHEN 5-325 MG PO TABS
2.00 | ORAL_TABLET | ORAL | Status: DC
Start: ? — End: 2020-04-12

## 2020-04-12 NOTE — Chronic Care Management (AMB) (Deleted)
Chronic Care Management Pharmacy  Name: William Clay  MRN: [id number] DOB: Oct 11, 1941  Chief Complaint/ HPI  William Clay,  79 y.o. , male presents for their Initial CCM visit with the clinical pharmacist via telephone due to COVID-19 Pandemic.  PCP : [name], MD  Their chronic conditions include: HTN, CAD, COPD, HLD, Depression.   Office Visits: 02/29/2020 - Pre-op clearance for spine/scoliosis surgery clearance. Besides his back pain pt voices no other concerns or problems and recently had chronic follow up visit and labwork which was all Clay.  02/21/2020 - Well controlled. No changes to medicines.  Continue to work on eating a healthy diet and exercise. 11/17/2019 - Medicare physical - started on sertraline for irritability/anger. Increased pravastatin for elevated TG and LDL on lipid panel.  10/20/2019 - flu shot given Consult Visit: 04/12/2020 - Admitted to inpatient rehab after revision of l3-l5 laminectomy.  04/10/2020 - Revision of l3-l5 procedure at [facility].   Medications: Outpatient Encounter Medications as of 04/18/2020  Medication Sig Note  . albuterol (PROVENTIL HFA;VENTOLIN HFA) 108 (90 Base) MCG/ACT inhaler Inhale two puffs every four to six hours as needed for cough or wheeze.   Marland Kitchen amLODipine (NORVASC) 10 MG tablet Take 10 mg by mouth daily.   . cetirizine (ZYRTEC) 10 MG tablet Take 1 tablet (10 mg total) by mouth daily.   Marland Kitchen losartan (COZAAR) 100 MG tablet Take 1 tablet (100 mg total) by mouth daily.   . meloxicam (MOBIC) 15 MG tablet Take 15 mg by mouth daily.   . Multiple Vitamins-Minerals (MULTIVITAMIN WITH MINERALS) tablet Take 1 tablet by mouth daily.   . Omega-3 Fatty Acids (FISH OIL) 1000 MG CAPS Take 1,000 mg by mouth 2 (two) times daily.   Marland Kitchen omeprazole (PRILOSEC) 40 MG capsule Take 40 mg by mouth daily. 09/30/2018: Taking BID  . pravastatin (PRAVACHOL) 20 MG tablet Take 1 tablet (20 mg total) by mouth daily.   . Probiotic Product (RESTORA PO)  Take 1 tablet by mouth daily.   . sertraline (ZOLOFT) 100 MG tablet Take 1 tablet (100 mg total) by mouth daily.   . Tiotropium Bromide-Olodaterol (STIOLTO RESPIMAT) 2.5-2.5 MCG/ACT AERS Inhale into the lungs.    No facility-administered encounter medications on file as of 04/18/2020.   Allergies  Allergen Reactions  . Lisinopril Other (See Comments)    Dyspnea Dyspnea   . Simvastatin Other (See Comments)    Muscle Cramps Muscle Cramps   . Amoxicillin     Mouth sores   . Gabapentin   . Potassium Clavulanate [Clavulanic Acid]     diarrhea    SDOH Screenings   Alcohol Screen:   . Last Alcohol Screening Score (AUDIT):   Depression (PHQ2-9):   . PHQ-2 Score:   Financial Resource Strain:   . Difficulty of Paying Living Expenses:   Food Insecurity:   . Worried About Charity fundraiser in the Last Year:   . [place] in the Last Year:   Housing:   . Last Housing Risk Score:   Physical Activity:   . Days of Exercise per Week:   . Minutes of Exercise per Session:   Social Connections:   . Frequency of Communication with Friends and Family:   . Frequency of Social Gatherings with Friends and Family:   . Attends Religious Services:   . Active Member of Clubs or Organizations:   . Attends Archivist Meetings:   Marland Kitchen Marital Status:   Stress:   .  Feeling of Stress :   Tobacco Use: Medium Risk  . Smoking Tobacco Use: Former Smoker  . Smokeless Tobacco Use: Never Used  Transportation Needs:   . Film/video editor (Medical):   Marland Kitchen Lack of Transportation (Non-Medical):      Current Diagnosis/Assessment:  Goals Addressed   None     COPD / Asthma / Tobacco   Last spirometry score: ***  Gold Grade: {CHL HP [facility] Pharm COPD Gold [id number] Current COPD Classification:  {CHL HP Upstream Pharm COPD Classification:[phone]}  Eosinophil count:  No results found for: EOSPCT%                               Eos (Absolute):  Lab Results   Component Value Date/Time   EOSABS 0.1 02/21/2020 09:01 AM    Tobacco Status:  Social History   Tobacco Use  Smoking Status Former Smoker  . Packs/day: 2.00  . Years: 50.00  . Pack years: 100.00  . Types: Cigarettes  . Quit date: 12/24/2007  . Years since quitting: 12.3  Smokeless Tobacco Never Used    Patient has failed these meds in past: asmanex, breo ellipta, singulair, duoneb Patient is currently {CHL Controlled/[phone]} on the following medications: albuterol prn, Stiolto respimat, Incruse Ellipta, oxygen Using maintenance inhaler regularly? {yes/no:20286} Frequency of rescue inhaler use:  {CHL HP [facility] Pharm Inhaler [id number]  We discussed:  {CHL HP Upstream Pharmacy discussion:[phone]}  Plan  Continue {CHL HP Upstream Pharmacy Plans:[phone]} ,  Hypertension   BP today is:  {CHL HP UPSTREAM Pharmacist BP ranges:[phone]}  Lab Results  Component Value Date   CREATININE 0.94 02/21/2020   Office blood pressures are  BP Readings from Last 3 Encounters:  02/29/20 138/82  02/21/20 124/68  11/30/18 130/90    Patient has failed these meds in the past: valsartan, lisinopril Patient is currently controlled/uncontrolled*** on the following medications: amlodipine 10 mg daily, losartan 100 mg daily Patient checks BP at home {CHL HP BP Monitoring Frequency:[phone]}  Patient home BP readings are ranging: ***  We discussed {CHL HP Upstream Pharmacy discussion:[phone]}  Plan  Continue {CHL HP Upstream Pharmacy Plans:[phone]}   Hyperlipidemia/CAD   Lipid Panel     Component Value Date/Time   CHOL 185 02/21/2020 0901   TRIG 269 (H) 02/21/2020 0901   HDL 42 02/21/2020 0901   CHOLHDL 4.4 02/21/2020 0901   LDLCALC 98 02/21/2020 0901   LABVLDL 45 (H) 02/21/2020 0901    CMP Latest Ref Rng & Units 02/21/2020  Glucose 65 - 99 mg/dL 89  BUN 8 - 27 mg/dL 11  Creatinine 0.76 - 1.27 mg/dL 0.94  Sodium 134 - 144 mmol/L 140   Potassium 3.5 - 5.2 mmol/L 4.6  Chloride 96 - 106 mmol/L 105  Calcium 8.6 - 10.2 mg/dL 9.8  Total Protein 6.0 - 8.5 g/dL 6.8  Total Bilirubin 0.0 - 1.2 mg/dL 0.7  Alkaline Phos 39 - 117 IU/L 86  AST 0 - 40 IU/L 31    The 10-year ASCVD risk score Mikey Bussing DC Jr., et al., 2013) is: 25.9%   Values used to calculate the score:     Age: 89 years     Sex: Male     Is Non-Hispanic African American: No     Diabetic: No     Tobacco smoker: No     Systolic Blood Pressure: [id number] mmHg     Is BP treated: Yes     HDL Cholesterol: 42  mg/dL     Total Cholesterol: 185 mg/dL   Patient has failed these meds in past: simvastatin Patient is currently {CHL Controlled/Uncontrolled:[id number]} on the following medications: omega 3 1000 mg bid, pravastatin 20 mg daily.  We discussed:  {CHL HP Upstream Pharmacy [phone]}  Plan  Continue {CHL HP Upstream Pharmacy Plans:4033074141}     and  Depression:     Patient has failed these meds in past: *** Patient is currently {CHL Controlled/Uncontrolled:3083734318} on the following medications: sertraline  We discussed:  {CHL HP Upstream Pharmacy [phone]}  Plan  Continue {CHL HP Upstream Pharmacy Plans:4033074141}  Lumbar Stenosis   Patient has failed these meds in past: tramadol, lyrica, gabapentin Patient is currently {CHL Controlled/Uncontrolled:3083734318} on the following medications: meloxicam 15 mg daily  We discussed:  ***  Plan  Continue {CHL HP Upstream Pharmacy Plans:4033074141}   ***GERD?   Patient has failed these meds in past: famotidine 40 mg, ranitidine 300 mg  Patient is currently {CHL Controlled/Uncontrolled:3083734318} on the following medications: omeprazole 40 mg daily  We discussed:  ***  Plan  Continue {CHL HP Upstream Pharmacy Plans:4033074141}   Health Maintenance   Patient is currently {CHL Controlled/Uncontrolled:3083734318} on the following medications: *** Cetirizine 10 mg daily  -  Multivitamin 15 mg daily -  Probiotic -  We discussed:  ***  Plan  Continue {CHL HP Upstream Pharmacy [id number]  Vaccines   Reviewed and discussed patient's vaccination history.    Immunization History  Administered Date(s) Administered  . Influenza Split 08/24/2015  . Moderna SARS-COVID-2 Vaccination 02/21/2020  . Pneumococcal Conjugate-13 10/23/2014    Plan  Recommended patient receive *** vaccine in *** office/pharmacy.   Medication Management   Pt uses *** pharmacy for all medications Uses pill box? {Yes or If no, why not?:20788} Pt endorses ***% compliance  We discussed: ***  Plan  {[facility] [id number]    Follow up: *** month phone visit

## 2020-04-14 MED ORDER — POLYSACCHARIDE IRON COMPLEX 150 MG PO CAPS
150.00 | ORAL_CAPSULE | ORAL | Status: DC
Start: 2020-04-25 — End: 2020-04-14

## 2020-04-14 MED ORDER — UMECLIDINIUM-VILANTEROL 62.5-25 MCG/INH IN AEPB
1.00 | INHALATION_SPRAY | RESPIRATORY_TRACT | Status: DC
Start: 2020-04-25 — End: 2020-04-14

## 2020-04-14 MED ORDER — SODIUM CHLORIDE FLUSH 0.9 % IV SOLN
5.00 | INTRAVENOUS | Status: DC
Start: 2020-04-14 — End: 2020-04-14

## 2020-04-18 ENCOUNTER — Telehealth: Payer: Medicare Other

## 2020-04-18 MED ORDER — METHOCARBAMOL 500 MG PO TABS
500.00 | ORAL_TABLET | ORAL | Status: DC
Start: ? — End: 2020-04-18

## 2020-04-18 MED ORDER — AMLODIPINE BESYLATE 5 MG PO TABS
5.00 | ORAL_TABLET | ORAL | Status: DC
Start: 2020-04-24 — End: 2020-04-18

## 2020-04-25 DIAGNOSIS — I2510 Atherosclerotic heart disease of native coronary artery without angina pectoris: Secondary | ICD-10-CM | POA: Diagnosis not present

## 2020-04-25 DIAGNOSIS — D62 Acute posthemorrhagic anemia: Secondary | ICD-10-CM | POA: Diagnosis not present

## 2020-04-25 DIAGNOSIS — Z981 Arthrodesis status: Secondary | ICD-10-CM | POA: Diagnosis not present

## 2020-04-25 DIAGNOSIS — Z6830 Body mass index (BMI) 30.0-30.9, adult: Secondary | ICD-10-CM | POA: Diagnosis not present

## 2020-04-25 DIAGNOSIS — I10 Essential (primary) hypertension: Secondary | ICD-10-CM | POA: Diagnosis not present

## 2020-04-25 DIAGNOSIS — M48062 Spinal stenosis, lumbar region with neurogenic claudication: Secondary | ICD-10-CM | POA: Diagnosis not present

## 2020-04-25 DIAGNOSIS — E785 Hyperlipidemia, unspecified: Secondary | ICD-10-CM | POA: Diagnosis not present

## 2020-04-25 DIAGNOSIS — M961 Postlaminectomy syndrome, not elsewhere classified: Secondary | ICD-10-CM | POA: Diagnosis not present

## 2020-04-25 DIAGNOSIS — M5432 Sciatica, left side: Secondary | ICD-10-CM | POA: Diagnosis not present

## 2020-04-25 DIAGNOSIS — E669 Obesity, unspecified: Secondary | ICD-10-CM | POA: Diagnosis not present

## 2020-04-25 DIAGNOSIS — Z4789 Encounter for other orthopedic aftercare: Secondary | ICD-10-CM | POA: Diagnosis not present

## 2020-04-25 DIAGNOSIS — K219 Gastro-esophageal reflux disease without esophagitis: Secondary | ICD-10-CM | POA: Diagnosis not present

## 2020-04-25 DIAGNOSIS — F329 Major depressive disorder, single episode, unspecified: Secondary | ICD-10-CM | POA: Diagnosis not present

## 2020-04-25 DIAGNOSIS — Z9181 History of falling: Secondary | ICD-10-CM | POA: Diagnosis not present

## 2020-04-25 DIAGNOSIS — J449 Chronic obstructive pulmonary disease, unspecified: Secondary | ICD-10-CM | POA: Diagnosis not present

## 2020-04-25 DIAGNOSIS — J9611 Chronic respiratory failure with hypoxia: Secondary | ICD-10-CM | POA: Diagnosis not present

## 2020-04-25 DIAGNOSIS — M4726 Other spondylosis with radiculopathy, lumbar region: Secondary | ICD-10-CM | POA: Diagnosis not present

## 2020-04-25 DIAGNOSIS — G4733 Obstructive sleep apnea (adult) (pediatric): Secondary | ICD-10-CM | POA: Diagnosis not present

## 2020-04-25 MED ORDER — POLYETHYLENE GLYCOL 3350 17 GM/SCOOP PO POWD
17.00 | ORAL | Status: DC
Start: 2020-04-25 — End: 2020-04-25

## 2020-04-25 MED ORDER — BISACODYL 10 MG RE SUPP
10.00 | RECTAL | Status: DC
Start: ? — End: 2020-04-25

## 2020-04-27 DIAGNOSIS — M4726 Other spondylosis with radiculopathy, lumbar region: Secondary | ICD-10-CM | POA: Diagnosis not present

## 2020-04-27 DIAGNOSIS — D62 Acute posthemorrhagic anemia: Secondary | ICD-10-CM | POA: Diagnosis not present

## 2020-04-27 DIAGNOSIS — J449 Chronic obstructive pulmonary disease, unspecified: Secondary | ICD-10-CM | POA: Diagnosis not present

## 2020-04-27 DIAGNOSIS — Z4789 Encounter for other orthopedic aftercare: Secondary | ICD-10-CM | POA: Diagnosis not present

## 2020-04-27 DIAGNOSIS — M48062 Spinal stenosis, lumbar region with neurogenic claudication: Secondary | ICD-10-CM | POA: Diagnosis not present

## 2020-04-27 DIAGNOSIS — M961 Postlaminectomy syndrome, not elsewhere classified: Secondary | ICD-10-CM | POA: Diagnosis not present

## 2020-05-01 DIAGNOSIS — Z4789 Encounter for other orthopedic aftercare: Secondary | ICD-10-CM | POA: Diagnosis not present

## 2020-05-01 DIAGNOSIS — M961 Postlaminectomy syndrome, not elsewhere classified: Secondary | ICD-10-CM | POA: Diagnosis not present

## 2020-05-01 DIAGNOSIS — M4726 Other spondylosis with radiculopathy, lumbar region: Secondary | ICD-10-CM | POA: Diagnosis not present

## 2020-05-01 DIAGNOSIS — D62 Acute posthemorrhagic anemia: Secondary | ICD-10-CM | POA: Diagnosis not present

## 2020-05-01 DIAGNOSIS — M48062 Spinal stenosis, lumbar region with neurogenic claudication: Secondary | ICD-10-CM | POA: Diagnosis not present

## 2020-05-01 DIAGNOSIS — J449 Chronic obstructive pulmonary disease, unspecified: Secondary | ICD-10-CM | POA: Diagnosis not present

## 2020-05-02 NOTE — Progress Notes (Signed)
Subjective:  Patient ID: William Clay, male    DOB: [birth year]  Age: 79 y.o. MRN: [id number]  Chief Complaint  Patient presents with  . Hospitalization Follow-up    Back surgery    HPI   Patient is s/p  REVISION L3-5 LAMINECTOMY AND FUSION W/ POSSIBLE TLIF, PEDICLE SCREW AND BMP, ALLOGRAFT, O-ARM W/ NAVIGATION.  He had surgery in mid April and then went to rehab. He is home now. He did develop anemia while admitted which is expected postop. Appetite is good. Drinking plenty of fluids P.T. is coming twice a week. Able to bathe, dress, bathroom and cook/eat without assistance.  Patient is not feeling depressed. Feels zoloft is helping.  Does not feel he needs any muscle relaxants.  Past Medical History:  Diagnosis Date  . Atherosclerosis of aorta ([place])   . Bowel obstruction ([place]) 2018  . BPH (benign prostatic hyperplasia)   . COPD (chronic obstructive pulmonary disease) ([place])   . COPD (chronic obstructive pulmonary disease) ([place])   . GERD (gastroesophageal reflux disease)   . Hypertension   . Hypoxemia   . Mixed hyperlipidemia   . Osteoarthritis   . Pneumonia [id number]  . Primary insomnia   . Sleep apnea   . Spontaneous ecchymoses    Past Surgical History:  Procedure Laterality Date  . ABDOMINAL AORTIC ANEURYSM REPAIR    . BACK SURGERY N/A [phone], 2021   Lower back and cervical spine  . BACK SURGERY  1984   upper back  . CATARACT EXTRACTION Left 2017  . CATARACT EXTRACTION Right 2017  . CHOLECYSTECTOMY  2018  . HERNIA REPAIR  2018  . PROSTATECTOMY    . ROTATOR CUFF REPAIR Right 2015  . ROTATOR CUFF REPAIR Left 2017    Family History  Problem Relation Age of Onset  . Heart failure Mother   . Diabetes Mother   . CAD Mother   . Hypertension Mother    Social History   Socioeconomic History  . Marital status: Married    Spouse name: Not on file  . Number of children: 2  . Years of education: Not on file  . Highest education level:  Not on file  Occupational History  . Occupation: retired    Comment: Event organiser  Tobacco Use  . Smoking status: Former Smoker    Packs/day: 2.00    Years: 50.00    Pack years: 100.00    Types: Cigarettes    Quit date: 12/24/2007    Years since quitting: 12.4  . Smokeless tobacco: Never Used  Substance and Sexual Activity  . Alcohol use: No    Alcohol/week: 0.0 standard drinks  . Drug use: No  . Sexual activity: Not on file  Other Topics Concern  . Not on file  Social History Narrative  . Not on file   Social Determinants of Health   Financial Resource Strain:   . Difficulty of Paying Living Expenses:   Food Insecurity: No Food Insecurity  . Worried About Charity fundraiser in the Last Year: Never true  . Ran Out of Food in the Last Year: Never true  Transportation Needs:   . Lack of Transportation (Medical):   Marland Kitchen Lack of Transportation (Non-Medical):   Physical Activity:   . Days of Exercise per Week:   . Minutes of Exercise per Session:   Stress:   . Feeling of Stress :   Social Connections:   . Frequency of Communication with Friends and Family:   .  Frequency of Social Gatherings with Friends and Family:   . Attends Religious Services:   . Active Member of Clubs or Organizations:   . Attends Archivist Meetings:   Marland Kitchen Marital Status:     Review of Systems  Constitutional: Negative for chills, diaphoresis, fatigue and fever.  HENT: Negative for congestion, ear pain and sore throat.   Respiratory: Negative for cough and shortness of breath.   Cardiovascular: Negative for chest pain and leg swelling.  Gastrointestinal: Negative for abdominal pain, constipation, diarrhea, nausea and vomiting.  Endocrine: Positive for polyuria.  Genitourinary: Negative for dysuria and urgency.  Musculoskeletal: Negative for arthralgias and myalgias.  Neurological: Negative for dizziness and headaches.  Psychiatric/Behavioral: Negative for dysphoric mood.      Objective:  BP 134/68   Pulse 97   Temp 97.9 F (36.6 C)   Ht 5\' 6"  (1.676 m)   Wt 183 lb (83 kg)   SpO2 (!) 78%   BMI 29.54 kg/m   BP/Weight 05/04/2020 0000000 [id number]  Systolic BP [id number] 0000000 [id number]  Diastolic BP 68 82 68  Wt. (Lbs) 183 192 192.8  BMI 29.54 28.35 28.47    Physical Exam Vitals reviewed.  Constitutional:      Appearance: Normal appearance. He is normal weight.  Neck:     Vascular: No carotid bruit.  Cardiovascular:     Rate and Rhythm: Normal rate and regular rhythm.     Heart sounds: Normal heart sounds.  Pulmonary:     Effort: Pulmonary effort is normal.     Breath sounds: Normal breath sounds.  Abdominal:     General: Abdomen is flat. Bowel sounds are normal.     Palpations: Abdomen is soft.  Musculoskeletal:     Comments: Back brace.  Neurological:     Mental Status: He is alert and oriented to person, place, and time.  Psychiatric:        Mood and Affect: Mood normal.        Behavior: Behavior normal.     Diabetic Foot Exam - Simple   No data filed       Lab Results  Component Value Date   WBC 14.2 (H) 05/04/2020   HGB 13.5 05/04/2020   HCT 40.6 05/04/2020   PLT 317 05/04/2020   GLUCOSE 110 (H) 05/04/2020   CHOL 185 02/21/2020   TRIG 269 (H) 02/21/2020   HDL 42 02/21/2020   LDLCALC 98 02/21/2020   ALT 16 05/04/2020   AST 25 05/04/2020   NA 142 05/04/2020   K 4.1 05/04/2020   CL 104 05/04/2020   CREATININE 0.95 05/04/2020   BUN 16 05/04/2020   CO2 21 05/04/2020   HGBA1C 5.3 02/21/2020      Assessment & Plan:   1. Iron deficiency anemia secondary to blood loss (chronic) - CBC with Differential/Platelet  2. Essential hypertension, benign Well controlled.  No changes to medicines.  Continue to work on eating a healthy diet and exercise.  Labs drawn today.  - Comprehensive metabolic panel  3. Sciatica of left side due to displacement of lumbar intervertebral disc  S/P SURGERY. DOING WELL WITH PHYSICAL THERAPY.    Meds ordered this encounter  Medications  . cyclobenzaprine (FLEXERIL) 5 MG tablet    Sig: Take 1 tablet (5 mg total) by mouth 3 (three) times daily as needed for muscle spasms.    Dispense:  90 tablet    Refill:  0    Orders Placed This Encounter  Procedures  .  CBC with Differential/Platelet  . Comprehensive metabolic panel     Follow-up: Return for PREVIOUS APPT SCHEDULED IN [place]..  An After Visit Summary was printed and given to the patient.  [name] [facility] [phone]

## 2020-05-04 ENCOUNTER — Ambulatory Visit (INDEPENDENT_AMBULATORY_CARE_PROVIDER_SITE_OTHER): Payer: Medicare Other | Admitting: Family Medicine

## 2020-05-04 ENCOUNTER — Encounter: Payer: Self-pay | Admitting: Family Medicine

## 2020-05-04 ENCOUNTER — Other Ambulatory Visit: Payer: Self-pay

## 2020-05-04 VITALS — BP 134/68 | HR 97 | Temp 97.9°F | Ht 66.0 in | Wt 183.0 lb

## 2020-05-04 DIAGNOSIS — M5116 Intervertebral disc disorders with radiculopathy, lumbar region: Secondary | ICD-10-CM

## 2020-05-04 DIAGNOSIS — M48062 Spinal stenosis, lumbar region with neurogenic claudication: Secondary | ICD-10-CM | POA: Diagnosis not present

## 2020-05-04 DIAGNOSIS — M961 Postlaminectomy syndrome, not elsewhere classified: Secondary | ICD-10-CM | POA: Diagnosis not present

## 2020-05-04 DIAGNOSIS — J449 Chronic obstructive pulmonary disease, unspecified: Secondary | ICD-10-CM | POA: Diagnosis not present

## 2020-05-04 DIAGNOSIS — I10 Essential (primary) hypertension: Secondary | ICD-10-CM | POA: Diagnosis not present

## 2020-05-04 DIAGNOSIS — D500 Iron deficiency anemia secondary to blood loss (chronic): Secondary | ICD-10-CM | POA: Diagnosis not present

## 2020-05-04 DIAGNOSIS — D62 Acute posthemorrhagic anemia: Secondary | ICD-10-CM | POA: Diagnosis not present

## 2020-05-04 DIAGNOSIS — Z4789 Encounter for other orthopedic aftercare: Secondary | ICD-10-CM | POA: Diagnosis not present

## 2020-05-04 DIAGNOSIS — M4726 Other spondylosis with radiculopathy, lumbar region: Secondary | ICD-10-CM | POA: Diagnosis not present

## 2020-05-04 MED ORDER — CYCLOBENZAPRINE HCL 5 MG PO TABS
5.0000 mg | ORAL_TABLET | Freq: Three times a day (TID) | ORAL | 0 refills | Status: DC | PRN
Start: 2020-05-04 — End: 2020-09-12

## 2020-05-04 NOTE — Patient Instructions (Signed)
Start flexeril  (cyclobenzaprine) 5 mg one three times a day as needed for back pain.  BLOOD COUNT AND CHEMISTRY PANEL DRAWN

## 2020-05-05 LAB — COMPREHENSIVE METABOLIC PANEL
ALT: 16 IU/L (ref 0–44)
AST: 25 IU/L (ref 0–40)
Albumin/Globulin Ratio: 1.4 (ref 1.2–2.2)
Albumin: 4 g/dL (ref 3.7–4.7)
Alkaline Phosphatase: 90 IU/L (ref 39–117)
BUN/Creatinine Ratio: 17 (ref 10–24)
BUN: 16 mg/dL (ref 8–27)
Bilirubin Total: 0.7 mg/dL (ref 0.0–1.2)
CO2: 21 mmol/L (ref 20–29)
Calcium: 10 mg/dL (ref 8.6–10.2)
Chloride: 104 mmol/L (ref 96–106)
Creatinine, Ser: 0.95 mg/dL (ref 0.76–1.27)
GFR calc Af Amer: 90 mL/min/{1.73_m2} (ref >59)
GFR calc non Af Amer: 77 mL/min/{1.73_m2} (ref >59)
Globulin, Total: 2.8 g/dL (ref 1.5–4.5)
Glucose: 110 mg/dL — ABNORMAL HIGH (ref 65–99)
Potassium: 4.1 mmol/L (ref 3.5–5.2)
Sodium: 142 mmol/L (ref 134–144)
Total Protein: 6.8 g/dL (ref 6.0–8.5)

## 2020-05-05 LAB — CBC WITH DIFFERENTIAL/PLATELET
Basophils Absolute: 0.1 10*3/uL (ref 0.0–0.2)
Basos: 0 %
EOS (ABSOLUTE): 0.1 10*3/uL (ref 0.0–0.4)
Eos: 1 %
Hematocrit: 40.6 % (ref 37.5–51.0)
Hemoglobin: 13.5 g/dL (ref 13.0–17.7)
Immature Grans (Abs): 0.1 10*3/uL (ref 0.0–0.1)
Immature Granulocytes: 1 %
Lymphocytes Absolute: 1.3 10*3/uL (ref 0.7–3.1)
Lymphs: 10 %
MCH: 31.9 pg (ref 26.6–33.0)
MCHC: 33.3 g/dL (ref 31.5–35.7)
MCV: 96 fL (ref 79–97)
Monocytes Absolute: 1.8 10*3/uL — ABNORMAL HIGH (ref 0.1–0.9)
Monocytes: 13 %
Neutrophils Absolute: 10.8 10*3/uL — ABNORMAL HIGH (ref 1.4–7.0)
Neutrophils: 75 %
Platelets: 317 10*3/uL (ref 150–450)
RBC: 4.23 x10E6/uL (ref 4.14–5.80)
RDW: 12 % (ref 11.6–15.4)
WBC: 14.2 10*3/uL — ABNORMAL HIGH (ref 3.4–10.8)

## 2020-05-08 ENCOUNTER — Other Ambulatory Visit: Payer: Self-pay

## 2020-05-08 DIAGNOSIS — M961 Postlaminectomy syndrome, not elsewhere classified: Secondary | ICD-10-CM | POA: Diagnosis not present

## 2020-05-08 DIAGNOSIS — M48062 Spinal stenosis, lumbar region with neurogenic claudication: Secondary | ICD-10-CM | POA: Diagnosis not present

## 2020-05-08 DIAGNOSIS — M4726 Other spondylosis with radiculopathy, lumbar region: Secondary | ICD-10-CM | POA: Diagnosis not present

## 2020-05-08 DIAGNOSIS — Z4789 Encounter for other orthopedic aftercare: Secondary | ICD-10-CM | POA: Diagnosis not present

## 2020-05-08 MED ORDER — OMEPRAZOLE 40 MG PO CPDR: 40.0000 mg | DELAYED_RELEASE_CAPSULE | Freq: Every day | ORAL | 1 refills | Status: DC

## 2020-05-09 ENCOUNTER — Telehealth: Payer: Self-pay

## 2020-05-09 DIAGNOSIS — Z981 Arthrodesis status: Secondary | ICD-10-CM | POA: Diagnosis not present

## 2020-05-09 DIAGNOSIS — M4326 Fusion of spine, lumbar region: Secondary | ICD-10-CM | POA: Diagnosis not present

## 2020-05-09 NOTE — Chronic Care Management (AMB) (Signed)
Chronic Care Management Pharmacy  Name: William Clay  MRN: [id number] DOB: 02-Aug-1941  Chief Complaint/ HPI  William Clay,  79 y.o. , male presents for their Initial CCM visit with the clinical pharmacist via telephone due to COVID-19 Pandemic.  PCP : William Brome, MD  Their chronic conditions include: HTN, CAD, COPD, HLD, Depression.   Office Visits: 05/04/2020 - Post-op discharge visit. Flexeril for back pain prescribed.  02/29/2020 - Pre-op clearance for spine/scoliosis surgery clearance. Besides his back pain pt voices no other concerns or problems and recently had chronic follow up visit and labwork which was all Clay.  02/21/2020 - Well controlled. No changes to medicines.  Continue to work on eating a healthy diet and exercise. 11/17/2019 - Medicare physical - started on sertraline for irritability/anger. Increased pravastatin for elevated TG and LDL on lipid panel.  10/20/2019 - flu shot given Consult Visit: 04/12/2020 - Admitted to inpatient rehab after revision of l3-l5 laminectomy.  04/10/2020 - Revision of l3-l5 procedure at [facility].   Medications: Outpatient Encounter Medications as of 05/10/2020  Medication Sig  . albuterol (PROVENTIL HFA;VENTOLIN HFA) 108 (90 Base) MCG/ACT inhaler Inhale two puffs every four to six hours as needed for cough or wheeze.  Marland Kitchen amLODipine (NORVASC) 10 MG tablet Take 10 mg by mouth daily.  . cetirizine (ZYRTEC) 10 MG tablet Take 1 tablet (10 mg total) by mouth daily.  . iron polysaccharides (NIFEREX) 150 MG capsule Take by mouth.  . losartan (COZAAR) 100 MG tablet Take 1 tablet (100 mg total) by mouth daily.  . methocarbamol (ROBAXIN) 500 MG tablet Take 500 mg by mouth 3 (three) times daily.  . Multiple Vitamins-Minerals (MULTIVITAMIN WITH MINERALS) tablet Take 1 tablet by mouth daily.  . Omega-3 Fatty Acids (FISH OIL) 1000 MG CAPS Take 1,000 mg by mouth 2 (two) times daily.  Marland Kitchen omeprazole (PRILOSEC) 40 MG capsule Take 1 capsule (40 mg  total) by mouth daily.  . pravastatin (PRAVACHOL) 20 MG tablet Take 1 tablet (20 mg total) by mouth daily.  . pregabalin (LYRICA) 50 MG capsule Take 50 mg by mouth 2 (two) times daily.  . Probiotic Product (RESTORA PO) Take 1 tablet by mouth daily.  . sertraline (ZOLOFT) 100 MG tablet Take 1 tablet (100 mg total) by mouth daily.  . Tiotropium Bromide-Olodaterol (STIOLTO RESPIMAT) 2.5-2.5 MCG/ACT AERS Inhale 2 puffs into the lungs daily.   . cyclobenzaprine (FLEXERIL) 5 MG tablet Take 1 tablet (5 mg total) by mouth 3 (three) times daily as needed for muscle spasms. (Patient not taking: Reported on 05/10/2020)  . meloxicam (MOBIC) 15 MG tablet Take 15 mg by mouth daily.   No facility-administered encounter medications on file as of 05/10/2020.   Allergies  Allergen Reactions  . Cefadroxil Shortness Of Breath  . Lisinopril Other (See Comments)    Dyspnea Dyspnea   . Simvastatin Other (See Comments)    Muscle Cramps Muscle Cramps   . Amoxicillin     Mouth sores   . Gabapentin   . Metoprolol Other (See Comments)    Leg cramps  . Potassium Clavulanate [Clavulanic Acid]     diarrhea    SDOH Screenings   Alcohol Screen:   . Last Alcohol Screening Score (AUDIT):   Depression (PHQ2-9): Low Risk   . PHQ-2 Score: 0  Financial Resource Strain:   . Difficulty of Paying Living Expenses:   Food Insecurity: No Food Insecurity  . Worried About Charity fundraiser in the Last Year: Never  true  . Ran Out of Food in the Last Year: Never true  Housing: Low Risk   . Last Housing Risk Score: 0  Physical Activity:   . Days of Exercise per Week:   . Minutes of Exercise per Session:   Social Connections:   . Frequency of Communication with Friends and Family:   . Frequency of Social Gatherings with Friends and Family:   . Attends Religious Services:   . Active Member of Clubs or Organizations:   . Attends Archivist Meetings:   Marland Kitchen Marital Status:   Stress:   . Feeling of Stress :     Tobacco Use: Medium Risk  . Smoking Tobacco Use: Former Smoker  . Smokeless Tobacco Use: Never Used  Transportation Needs:   . Film/video editor (Medical):   Marland Kitchen Lack of Transportation (Non-Medical):      Current Diagnosis/Assessment:  Goals Addressed            This Visit's Progress   . Pharmacy Care Plan       CARE PLAN ENTRY  Current Barriers:  . Chronic Disease Management support, education, and care coordination needs related to Hypertension and Hyperlipidemia   Hypertension . Pharmacist Clinical Goal(s): o Over the next 90 days, patient will work with PharmD and providers to maintain BP goal <140/90 . Current regimen:  o Amlodipine 10 mg daily and Losartan 100 mg daily.  . Interventions: o Encouraged patient continue the good work with managing blood pressure.  . Patient self care activities - Over the next 90 days, patient will: o Check BP weekly, document, and provide at future appointments o Ensure daily salt intake < 2300 mg/day  Hyperlipidemia . Pharmacist Clinical Goal(s): o Over the next 90 days, patient will work with PharmD and providers to maintain LDL goal < 100 . Current regimen:  o Pravastatin 20 mg and Omega 3 1000 mg bid. . Interventions: o Discussed elevated Triglyceride levels. . Patient self care activities - Over the next 90 days, patient will: o Recommend patient work to eat a low fat, heart healthy diet in order to improve next Triglyceride levels.  Medication management . Pharmacist Clinical Goal(s): o Over the next 90 days, patient will work with PharmD and providers to maintain optimal medication adherence . Current pharmacy: [facility] . Interventions o Comprehensive medication review performed. o Continue current medication management strategy . Patient self care activities - Over the next 90 days, patient will: o Focus on medication adherence by continuing to use pill box.  o Take medications as prescribed o Report any  questions or concerns to PharmD and/or provider(s)  Initial goal documentation        COPD / Asthma / Tobacco   Last spirometry score: 2.46 @ 87%                           Eos (Absolute):  Lab Results  Component Value Date/Time   EOSABS 0.1 05/04/2020 02:32 PM    Tobacco Status:  Social History   Tobacco Use  Smoking Status Former Smoker  . Packs/day: 2.00  . Years: 50.00  . Pack years: 100.00  . Types: Cigarettes  . Quit date: 12/24/2007  . Years since quitting: 12.3  Smokeless Tobacco Never Used    Patient has failed these meds in past: asmanex, breo ellipta, singulair, duoneb Patient is currently controlled on the following medications: albuterol prn, Stiolto respimat, Incruse Ellipta, oxygen Using maintenance inhaler regularly?  Yes Frequency of rescue inhaler use:  daily  We discussed:  proper inhaler technique. Patient reports that is well managed currently. He is on Oxygen. He receives his Stiolto from the [facility] due to cost at [place].   Plan  Continue current medications ,  Hypertension   BP today is:  <140/90  Lab Results  Component Value Date   CREATININE 0.95 05/04/2020   CREATININE 0.94 02/21/2020   Office blood pressures are  BP Readings from Last 3 Encounters:  05/04/20 134/68  02/29/20 138/82  02/21/20 124/68    Patient has failed these meds in the past: valsartan, lisinopril Patient is currently controlled on the following medications: amlodipine 10 mg daily, losartan 100 mg daily Patient checks BP at home weekly  Patient home BP readings are ranging: 120/80 mmHg  We discussed diet and exercise extensively  Plan  Continue current medications   Hyperlipidemia/CAD   Lipid Panel     Component Value Date/Time   CHOL 185 02/21/2020 0901   TRIG 269 (H) 02/21/2020 0901   HDL 42 02/21/2020 0901   CHOLHDL 4.4 02/21/2020 0901   LDLCALC 98 02/21/2020 0901   LABVLDL 45 (H) 02/21/2020 0901    CMP Latest Ref Rng &  Units 05/04/2020 02/21/2020  Glucose 65 - 99 mg/dL 110(H) 89  BUN 8 - 27 mg/dL 16 11  Creatinine 0.76 - 1.27 mg/dL 0.95 0.94  Sodium 134 - 144 mmol/L 142 140  Potassium 3.5 - 5.2 mmol/L 4.1 4.6  Chloride 96 - 106 mmol/L 104 105  CO2 20 - 29 mmol/L 21 -  Calcium 8.6 - 10.2 mg/dL 10.0 9.8  Total Protein 6.0 - 8.5 g/dL 6.8 6.8  Total Bilirubin 0.0 - 1.2 mg/dL 0.7 0.7  Alkaline Phos 39 - 117 IU/L 90 86  AST 0 - 40 IU/L 25 31  ALT 0 - 44 IU/L 16 -    The 10-year ASCVD risk score Mikey Bussing DC Jr., et al., 2013) is: 33%   Values used to calculate the score:     Age: 79 years     Sex: Male     Is Non-Hispanic African American: No     Diabetic: No     Tobacco smoker: No     Systolic Blood Pressure: [id number] mmHg     Is BP treated: Yes     HDL Cholesterol: 42 mg/dL     Total Cholesterol: 185 mg/dL   Patient has failed these meds in past: simvastatin Patient is currently uncontrolled on the following medications: omega 3 1000 mg bid, pravastatin 20 mg daily.  We discussed:  diet and exercise extensively. His diet varies from day to day.  Eats cereal with a banana in it with coffee for breakfast. May eat a sandwich for lunch. Chicken and vegetables for suppers. He has cut out most bread except for an occasional sandwich or breakfast. Discussed elevated triglycerides and being mindful with diet.   Plan  Continue current medications    and  Depression:     Patient has failed these meds in past: n/a Patient is currently controlled on the following medications: sertraline  We discussed: Patient reports good control.   Plan  Continue current medications  Lumbar Stenosis   Patient has failed these meds in past: tramadol, lyrica, gabapentin, meloxicam Patient is currently controlled on the following medications: pregabalin 50 mg bid  We discussed:  Patient is recovering from a recent surgery. He is doing physical therapy at home. He is off of  meloxicam right now but hopes to resume taking in  2 more months.   Plan  Continue current medications   GERD   Patient has failed these meds in past: famotidine 40 mg, ranitidine 300 mg  Patient is currently controlled on the following medications: omeprazole 40 mg daily  We discussed:  Patient states that he is taking bid. Bottle states daily. He was previously using omeprazole 20 mg bid. We discussed and patient is going to try to just take in the am and see how symptoms respond. He will let pharmacist know if needs to be increased to twice daily.   Plan  Continue current medications   Health Maintenance   Patient is currently controlled on the following medications:  Cetirizine 10 mg daily - allergies Multivitamin daily - general health Probiotic - general health We discussed:  Patient reports that he is well managed and recovering well.    Plan  Continue current medications  Vaccines   Reviewed and discussed patient's vaccination history.    Immunization History  Administered Date(s) Administered  . Influenza Split 08/24/2015  . Moderna SARS-COVID-2 Vaccination 02/21/2020  . Pneumococcal Conjugate-13 10/23/2014    Plan  Recommended patient receive annual flu vaccine in office.  Medication Management   [facility] for all medications Uses pill box? Yes - stores medication in a tote bag from [facility]. He uses a weekly pill organizer as well.  Pt endorses excellent compliance  We discussed: He rarely misses doses of medication.   Plan  Continue current medication management strategy    Follow up: 3 month phone visit

## 2020-05-09 NOTE — Telephone Encounter (Signed)
PA for Cyclobenzaprine submitted and approved via covermymeds. [id number]

## 2020-05-10 ENCOUNTER — Ambulatory Visit: Payer: Medicare Other

## 2020-05-10 ENCOUNTER — Other Ambulatory Visit: Payer: Self-pay

## 2020-05-10 DIAGNOSIS — M961 Postlaminectomy syndrome, not elsewhere classified: Secondary | ICD-10-CM | POA: Diagnosis not present

## 2020-05-10 DIAGNOSIS — M48062 Spinal stenosis, lumbar region with neurogenic claudication: Secondary | ICD-10-CM | POA: Diagnosis not present

## 2020-05-10 DIAGNOSIS — M4726 Other spondylosis with radiculopathy, lumbar region: Secondary | ICD-10-CM | POA: Diagnosis not present

## 2020-05-10 DIAGNOSIS — J449 Chronic obstructive pulmonary disease, unspecified: Secondary | ICD-10-CM | POA: Diagnosis not present

## 2020-05-10 DIAGNOSIS — E782 Mixed hyperlipidemia: Secondary | ICD-10-CM

## 2020-05-10 DIAGNOSIS — I10 Essential (primary) hypertension: Secondary | ICD-10-CM

## 2020-05-10 DIAGNOSIS — D62 Acute posthemorrhagic anemia: Secondary | ICD-10-CM | POA: Diagnosis not present

## 2020-05-10 DIAGNOSIS — Z4789 Encounter for other orthopedic aftercare: Secondary | ICD-10-CM | POA: Diagnosis not present

## 2020-05-10 NOTE — Patient Instructions (Addendum)
Visit Information  Thank you for your time discussing your medications. I look forward to working with you to achieve your health care goals. Below is a summary of what we talked about during our visit.   Goals Addressed            This Visit's Progress   . Pharmacy Care Plan       CARE PLAN ENTRY  Current Barriers:  . Chronic Disease Management support, education, and care coordination needs related to Hypertension and Hyperlipidemia   Hypertension . Pharmacist Clinical Goal(s): o Over the next 90 days, patient will work with PharmD and providers to maintain BP goal <140/90 . Current regimen:  o Amlodipine 10 mg daily and Losartan 100 mg daily.  . Interventions: o Encouraged patient continue the good work with managing blood pressure.  . Patient self care activities - Over the next 90 days, patient will: o Check BP weekly, document, and provide at future appointments o Ensure daily salt intake < 2300 mg/day  Hyperlipidemia . Pharmacist Clinical Goal(s): o Over the next 90 days, patient will work with PharmD and providers to maintain LDL goal < 100 . Current regimen:  o Pravastatin 20 mg and Omega 3 1000 mg bid. . Interventions: o Discussed elevated Triglyceride levels. . Patient self care activities - Over the next 90 days, patient will: o Recommend patient work to eat a low fat, heart healthy diet in order to improve next Triglyceride levels.  Medication management . Pharmacist Clinical Goal(s): o Over the next 90 days, patient will work with PharmD and providers to maintain optimal medication adherence . Current pharmacy: [facility] . Interventions o Comprehensive medication review performed. o Continue current medication management strategy . Patient self care activities - Over the next 90 days, patient will: o Focus on medication adherence by continuing to use pill box.  o Take medications as prescribed o Report any questions or concerns to PharmD and/or  provider(s)  Initial goal documentation        William Clay was given information about Chronic Care Management services today including:  1. CCM service includes personalized support from designated clinical staff supervised by his physician, including individualized plan of care and coordination with other care providers 2. 24/7 contact phone numbers for assistance for urgent and routine care needs. 3. Standard insurance, coinsurance, copays and deductibles apply for chronic care management only during months in which we provide at least 20 minutes of these services. Most insurances cover these services at 100%, however patients may be responsible for any copay, coinsurance and/or deductible if applicable. This service may help you avoid the need for more expensive face-to-face services. 4. Only one practitioner may furnish and bill the service in a calendar month. 5. The patient may stop CCM services at any time (effective at the end of the month) by phone call to the office staff.  Patient agreed to services and verbal consent obtained.   The patient verbalized understanding of instructions provided today and agreed to receive a mailed copy of patient instruction and/or educational materials. Telephone follow up appointment with pharmacy team member scheduled for: 08/09/2020  Sherre Poot, PharmD Clinical Pharmacist [facility] [phone] (office) [phone] (mobile)  Heart-Healthy Eating Plan Heart-healthy meal planning includes:  Eating less unhealthy fats.  Eating more healthy fats.  Making other changes in your diet. Talk with your doctor or a diet specialist (dietitian) to create an eating plan that is right for you. What is my plan? Your doctor  may recommend an eating plan that includes:  Total fat: ______% or less of total calories a day.  Saturated fat: ______% or less of total calories a day.  Cholesterol: less than _________mg a day. What are tips  for following this plan? Cooking Avoid frying your food. Try to bake, boil, grill, or broil it instead. You can also reduce fat by:  Removing the skin from poultry.  Removing all visible fats from meats.  Steaming vegetables in water or broth. Meal planning   At meals, divide your plate into four equal parts: ? Fill one-half of your plate with vegetables and green salads. ? Fill one-fourth of your plate with whole grains. ? Fill one-fourth of your plate with lean protein foods.  Eat 4-5 servings of vegetables per day. A serving of vegetables is: ? 1 cup of raw or cooked vegetables. ? 2 cups of raw leafy greens.  Eat 4-5 servings of fruit per day. A serving of fruit is: ? 1 medium whole fruit. ?  cup of dried fruit. ?  cup of fresh, frozen, or canned fruit. ?  cup of 100% fruit juice.  Eat more foods that have soluble fiber. These are apples, broccoli, carrots, beans, peas, and barley. Try to get 20-30 g of fiber per day.  Eat 4-5 servings of nuts, legumes, and seeds per week: ? 1 serving of dried beans or legumes equals  cup after being cooked. ? 1 serving of nuts is  cup. ? 1 serving of seeds equals 1 tablespoon. General information  Eat more home-cooked food. Eat less restaurant, buffet, and fast food.  Limit or avoid alcohol.  Limit foods that are high in starch and sugar.  Avoid fried foods.  Lose weight if you are overweight.  Keep track of how much salt (sodium) you eat. This is important if you have high blood pressure. Ask your doctor to tell you more about this.  Try to add vegetarian meals each week. Fats  Choose healthy fats. These include olive oil and canola oil, flaxseeds, walnuts, almonds, and seeds.  Eat more omega-3 fats. These include salmon, mackerel, sardines, tuna, flaxseed oil, and ground flaxseeds. Try to eat fish at least 2 times each week.  Check food labels. Avoid foods with trans fats or high amounts of saturated fat.  Limit  saturated fats. ? These are often found in animal products, such as meats, butter, and cream. ? These are also found in plant foods, such as palm oil, palm kernel oil, and coconut oil.  Avoid foods with partially hydrogenated oils in them. These have trans fats. Examples are stick margarine, some tub margarines, cookies, crackers, and other baked goods. What foods can I eat? Fruits All fresh, canned (in natural juice), or frozen fruits. Vegetables Fresh or frozen vegetables (raw, steamed, roasted, or grilled). Green salads. Grains Most grains. Choose whole wheat and whole grains most of the time. Rice and pasta, including Tomislav Micale rice and pastas made with whole wheat. Meats and other proteins Lean, well-trimmed beef, veal, pork, and lamb. Chicken and [place] without skin. All fish and shellfish. Wild duck, rabbit, pheasant, and venison. Egg whites or low-cholesterol egg substitutes. Dried beans, peas, lentils, and tofu. Seeds and most nuts. Dairy Low-fat or nonfat cheeses, including ricotta and mozzarella. Skim or 1% milk that is liquid, powdered, or evaporated. Buttermilk that is made with low-fat milk. Nonfat or low-fat yogurt. Fats and oils Non-hydrogenated (trans-free) margarines. Vegetable oils, including soybean, sesame, sunflower, olive, peanut, safflower, corn, canola,  and cottonseed. Salad dressings or mayonnaise made with a vegetable oil. Beverages Mineral water. Coffee and tea. Diet carbonated beverages. Sweets and desserts Sherbet, gelatin, and fruit ice. Small amounts of dark chocolate. Limit all sweets and desserts. Seasonings and condiments All seasonings and condiments. The items listed above may not be a complete list of foods and drinks you can eat. Contact a dietitian for more options. What foods should I avoid? Fruits Canned fruit in heavy syrup. Fruit in cream or butter sauce. Fried fruit. Limit coconut. Vegetables Vegetables cooked in cheese, cream, or butter sauce.  Fried vegetables. Grains Breads that are made with saturated or trans fats, oils, or whole milk. Croissants. Sweet rolls. Donuts. High-fat crackers, such as cheese crackers. Meats and other proteins Fatty meats, such as hot dogs, ribs, sausage, bacon, rib-eye roast or steak. High-fat deli meats, such as salami and bologna. Caviar. Domestic duck and goose. Organ meats, such as liver. Dairy Cream, sour cream, cream cheese, and creamed cottage cheese. Whole-milk cheeses. Whole or 2% milk that is liquid, evaporated, or condensed. Whole buttermilk. Cream sauce or high-fat cheese sauce. Yogurt that is made from whole milk. Fats and oils Meat fat, or shortening. Cocoa butter, hydrogenated oils, palm oil, coconut oil, palm kernel oil. Solid fats and shortenings, including bacon fat, salt pork, lard, and butter. Nondairy cream substitutes. Salad dressings with cheese or sour cream. Beverages Regular sodas and juice drinks with added sugar. Sweets and desserts Frosting. Pudding. Cookies. Cakes. Pies. Milk chocolate or white chocolate. Buttered syrups. Full-fat ice cream or ice cream drinks. The items listed above may not be a complete list of foods and drinks to avoid. Contact a dietitian for more information. Summary  Heart-healthy meal planning includes eating less unhealthy fats, eating more healthy fats, and making other changes in your diet.  Eat a balanced diet. This includes fruits and vegetables, low-fat or nonfat dairy, lean protein, nuts and legumes, whole grains, and heart-healthy oils and fats. This information is not intended to replace advice given to you by your health care provider. Make sure you discuss any questions you have with your health care provider. Document Revised: 02/12/2018 Document Reviewed: 01/16/2018 Elsevier Patient Education  2020 [employer].

## 2020-05-12 DIAGNOSIS — M4726 Other spondylosis with radiculopathy, lumbar region: Secondary | ICD-10-CM | POA: Diagnosis not present

## 2020-05-12 DIAGNOSIS — Z4789 Encounter for other orthopedic aftercare: Secondary | ICD-10-CM | POA: Diagnosis not present

## 2020-05-12 DIAGNOSIS — M48062 Spinal stenosis, lumbar region with neurogenic claudication: Secondary | ICD-10-CM | POA: Diagnosis not present

## 2020-05-12 DIAGNOSIS — M961 Postlaminectomy syndrome, not elsewhere classified: Secondary | ICD-10-CM | POA: Diagnosis not present

## 2020-05-12 DIAGNOSIS — D62 Acute posthemorrhagic anemia: Secondary | ICD-10-CM | POA: Diagnosis not present

## 2020-05-12 DIAGNOSIS — J449 Chronic obstructive pulmonary disease, unspecified: Secondary | ICD-10-CM | POA: Diagnosis not present

## 2020-05-15 DIAGNOSIS — J449 Chronic obstructive pulmonary disease, unspecified: Secondary | ICD-10-CM | POA: Diagnosis not present

## 2020-05-15 DIAGNOSIS — M4726 Other spondylosis with radiculopathy, lumbar region: Secondary | ICD-10-CM | POA: Diagnosis not present

## 2020-05-15 DIAGNOSIS — D62 Acute posthemorrhagic anemia: Secondary | ICD-10-CM | POA: Diagnosis not present

## 2020-05-15 DIAGNOSIS — M961 Postlaminectomy syndrome, not elsewhere classified: Secondary | ICD-10-CM | POA: Diagnosis not present

## 2020-05-15 DIAGNOSIS — Z4789 Encounter for other orthopedic aftercare: Secondary | ICD-10-CM | POA: Diagnosis not present

## 2020-05-15 DIAGNOSIS — M48062 Spinal stenosis, lumbar region with neurogenic claudication: Secondary | ICD-10-CM | POA: Diagnosis not present

## 2020-05-19 ENCOUNTER — Other Ambulatory Visit: Payer: Self-pay

## 2020-05-19 DIAGNOSIS — I10 Essential (primary) hypertension: Secondary | ICD-10-CM

## 2020-05-19 DIAGNOSIS — E782 Mixed hyperlipidemia: Secondary | ICD-10-CM

## 2020-05-19 NOTE — Progress Notes (Signed)
Patient had an appt on 05/10/2020 with Sherre Poot, PharmD.

## 2020-05-22 ENCOUNTER — Encounter: Payer: Self-pay | Admitting: Family Medicine

## 2020-05-24 ENCOUNTER — Other Ambulatory Visit: Payer: Self-pay | Admitting: Family Medicine

## 2020-05-24 ENCOUNTER — Other Ambulatory Visit: Payer: Self-pay

## 2020-05-24 ENCOUNTER — Other Ambulatory Visit: Payer: Medicare Other

## 2020-05-24 DIAGNOSIS — R7301 Impaired fasting glucose: Secondary | ICD-10-CM

## 2020-05-24 DIAGNOSIS — E782 Mixed hyperlipidemia: Secondary | ICD-10-CM | POA: Diagnosis not present

## 2020-05-24 DIAGNOSIS — I10 Essential (primary) hypertension: Secondary | ICD-10-CM

## 2020-05-24 NOTE — Progress Notes (Signed)
Subjective:  Patient ID: William Clay, male    DOB: 31-May-1941  Age: 79 y.o. MRN: [id number]  Chief Complaint  Patient presents with  . Hyperlipidemia  . Hypertension  . Depression  . Gastroesophageal Reflux  . Prediabetes    HPI Patient is a 79 year old white male with history of hyperlipidemia, hypertension, GERD, COPD, and prediabetes.  Patient is eating healthy.  He is walking daily (recent surgery.)  For his COPD he is currently on Stiolto, Singulair, and DuoNeb treatments he also uses 2 L of oxygen at night in addition to his cpap. Wears CPAP all night. He clearly feel he benefits from It. The patient's hypertension is well controlled on amlodipine and losartan.  Patient is also on Zoloft for atypical depression.  This works well.  Patient is having back pain (S/P back surgery) some, but better. His legs burn and ache by the end of the day. He is also complaining of neck pain on the left side. Denies numbness in his arms.   Social History   Socioeconomic History  . Marital status: Married    Spouse name: Not on file  . Number of children: 2  . Years of education: Not on file  . Highest education level: Not on file  Occupational History  . Occupation: retired    Comment: Event organiser  Tobacco Use  . Smoking status: Former Smoker    Packs/day: 2.00    Years: 50.00    Pack years: 100.00    Types: Cigarettes    Quit date: 12/24/2007    Years since quitting: 12.4  . Smokeless tobacco: Never Used  Substance and Sexual Activity  . Alcohol use: No    Alcohol/week: 0.0 standard drinks  . Drug use: No  . Sexual activity: Not on file  Other Topics Concern  . Not on file  Social History Narrative  . Not on file   Social Determinants of Health   Financial Resource Strain:   . Difficulty of Paying Living Expenses:   Food Insecurity: No Food Insecurity  . Worried About Charity fundraiser in the Last Year: Never true  . Ran Out of Food in the Last Year: Never true    Transportation Needs:   . Lack of Transportation (Medical):   Marland Kitchen Lack of Transportation (Non-Medical):   Physical Activity:   . Days of Exercise per Week:   . Minutes of Exercise per Session:   Stress:   . Feeling of Stress :   Social Connections:   . Frequency of Communication with Friends and Family:   . Frequency of Social Gatherings with Friends and Family:   . Attends Religious Services:   . Active Member of Clubs or Organizations:   . Attends Archivist Meetings:   Marland Kitchen Marital Status:    Past Medical History:  Diagnosis Date  . Atherosclerosis of aorta ([place])   . Bowel obstruction ([place]) 2018  . BPH (benign prostatic hyperplasia)   . COPD (chronic obstructive pulmonary disease) ([place])   . COPD (chronic obstructive pulmonary disease) ([place])   . GERD (gastroesophageal reflux disease)   . Hypertension   . Hypoxemia   . Mixed hyperlipidemia   . Osteoarthritis   . Pneumonia [id number]  . Primary insomnia   . Sleep apnea   . Spontaneous ecchymoses    Family History  Problem Relation Age of Onset  . Heart failure Mother   . Diabetes Mother   . CAD Mother   . Hypertension Mother  Review of Systems  Constitutional: Negative for chills, fatigue and fever.  HENT: Negative for congestion, ear pain and sore throat.   Respiratory: Negative for cough and shortness of breath.   Cardiovascular: Negative for chest pain.  Gastrointestinal: Negative for abdominal pain, constipation, diarrhea, nausea and vomiting.  Endocrine: Negative for polydipsia, polyphagia and polyuria.  Genitourinary: Negative for dysuria and frequency.  Musculoskeletal: Positive for back pain. Negative for arthralgias and myalgias.  Neurological: Negative for dizziness and headaches.  Psychiatric/Behavioral: Negative for dysphoric mood. The patient is not nervous/anxious.        No anhedonia     Objective:  BP 118/76   Pulse 90   Temp (!) 97.5 F (36.4 C)   Ht 5\' 7"  (1.702 m)   Wt 197 lb  (89.4 kg)   SpO2 93%   BMI 30.85 kg/m   BP/Weight 05/26/2020 [id number]  Systolic BP 123456 [id number] 0000000  Diastolic BP 76 68 82  Wt. (Lbs) 197 183 192  BMI 30.85 29.54 28.35    Physical Exam Vitals reviewed.  Constitutional:      Appearance: Normal appearance.  Neck:     Vascular: No carotid bruit.  Cardiovascular:     Rate and Rhythm: Normal rate and regular rhythm.     Pulses: Normal pulses.     Heart sounds: Normal heart sounds.  Pulmonary:     Effort: Pulmonary effort is normal.     Breath sounds: Normal breath sounds. No wheezing, rhonchi or rales.  Abdominal:     General: Bowel sounds are normal.     Palpations: Abdomen is soft.     Tenderness: There is no abdominal tenderness.  Musculoskeletal:     Comments: Left side of neck - tender. Spasm. Wearing lumbar back brace.    Neurological:     Mental Status: He is alert.  Psychiatric:        Mood and Affect: Mood normal.        Behavior: Behavior normal.     Lab Results  Component Value Date   WBC 11.9 (H) 05/24/2020   HGB 13.1 05/24/2020   HCT 40.3 05/24/2020   PLT 298 05/24/2020   GLUCOSE 93 05/24/2020   CHOL 161 05/24/2020   TRIG 126 05/24/2020   HDL 37 (L) 05/24/2020   LDLCALC 101 (H) 05/24/2020   ALT 18 05/24/2020   AST 20 05/24/2020   NA 140 05/24/2020   K 3.8 05/24/2020   CL 104 05/24/2020   CREATININE 0.95 05/24/2020   BUN 14 05/24/2020   CO2 22 05/24/2020   HGBA1C 5.7 (H) 05/24/2020      Assessment & Plan:  1. Mixed hyperlipidemia The current medical regimen is effective;  continue present plan and medications. Recommend continue to work on eating healthy diet and exercise.  2. Essential hypertension, benign The current medical regimen is effective;  continue present plan and medications. Recommend continue to work on eating healthy diet and exercise.  3. Impaired fasting glucose Recommend continue to work on eating healthy diet and exercise.  4. Chronic respiratory failure with  hypoxia (HCC) Continue to wear O2 2 Liters at night  5. Simple chronic bronchitis (HCC) The current medical regimen is effective;  continue present plan and medications.  6. Class 1 obesity due to excess calories with serious comorbidity and body mass index (BMI) of 30.0 to 30.9 in adult Recommend continue to work on eating healthy diet and exercise.  7. Neck pain Restart flexeril 5 mg one three times  a day as needed. Exercises given.   8. Chronic lumbar radiculopathy Increase lyrica to 50 mg 2 twice daily.   9. OSA - Continue to wear cpap. Compliant. Benefitting.   Meds ordered this encounter  Medications  . cetirizine (ZYRTEC) 10 MG tablet    Sig: Take 1 tablet (10 mg total) by mouth daily.    Dispense:  90 tablet    Refill:  3  . sertraline (ZOLOFT) 100 MG tablet    Sig: Take 1 tablet (100 mg total) by mouth daily.    Dispense:  90 tablet    Refill:  3   Follow-up: Return in about 4 months (around 09/25/2020) for Also needs Annual Wellness Visit after 11/22/2020.  AVS was given to patient prior to departure.  [name] [facility] [phone]

## 2020-05-25 DIAGNOSIS — Z4789 Encounter for other orthopedic aftercare: Secondary | ICD-10-CM | POA: Diagnosis not present

## 2020-05-25 LAB — COMPREHENSIVE METABOLIC PANEL
ALT: 18 IU/L (ref 0–44)
AST: 20 IU/L (ref 0–40)
Albumin/Globulin Ratio: 1.6 (ref 1.2–2.2)
Albumin: 4.2 g/dL (ref 3.7–4.7)
Alkaline Phosphatase: 108 IU/L (ref 48–121)
BUN/Creatinine Ratio: 15 (ref 10–24)
BUN: 14 mg/dL (ref 8–27)
Bilirubin Total: 0.6 mg/dL (ref 0.0–1.2)
CO2: 22 mmol/L (ref 20–29)
Calcium: 9.7 mg/dL (ref 8.6–10.2)
Chloride: 104 mmol/L (ref 96–106)
Creatinine, Ser: 0.95 mg/dL (ref 0.76–1.27)
GFR calc Af Amer: 90 mL/min/{1.73_m2} (ref >59)
GFR calc non Af Amer: 77 mL/min/{1.73_m2} (ref >59)
Globulin, Total: 2.7 g/dL (ref 1.5–4.5)
Glucose: 93 mg/dL (ref 65–99)
Potassium: 3.8 mmol/L (ref 3.5–5.2)
Sodium: 140 mmol/L (ref 134–144)
Total Protein: 6.9 g/dL (ref 6.0–8.5)

## 2020-05-25 LAB — CBC WITH DIFFERENTIAL/PLATELET
Basophils Absolute: 0.1 10*3/uL (ref 0.0–0.2)
Basos: 0 %
EOS (ABSOLUTE): 0.1 10*3/uL (ref 0.0–0.4)
Eos: 1 %
Hematocrit: 40.3 % (ref 37.5–51.0)
Hemoglobin: 13.1 g/dL (ref 13.0–17.7)
Immature Grans (Abs): 0.1 10*3/uL (ref 0.0–0.1)
Immature Granulocytes: 1 %
Lymphocytes Absolute: 1.4 10*3/uL (ref 0.7–3.1)
Lymphs: 12 %
MCH: 30.1 pg (ref 26.6–33.0)
MCHC: 32.5 g/dL (ref 31.5–35.7)
MCV: 93 fL (ref 79–97)
Monocytes Absolute: 1.1 10*3/uL — ABNORMAL HIGH (ref 0.1–0.9)
Monocytes: 10 %
Neutrophils Absolute: 9.1 10*3/uL — ABNORMAL HIGH (ref 1.4–7.0)
Neutrophils: 76 %
Platelets: 298 10*3/uL (ref 150–450)
RBC: 4.35 x10E6/uL (ref 4.14–5.80)
RDW: 12.4 % (ref 11.6–15.4)
WBC: 11.9 10*3/uL — ABNORMAL HIGH (ref 3.4–10.8)

## 2020-05-25 LAB — LIPID PANEL
Chol/HDL Ratio: 4.4 ratio (ref 0.0–5.0)
Cholesterol, Total: 161 mg/dL (ref 100–199)
HDL: 37 mg/dL — ABNORMAL LOW (ref >39)
LDL Chol Calc (NIH): 101 mg/dL — ABNORMAL HIGH (ref 0–99)
Triglycerides: 126 mg/dL (ref 0–149)
VLDL Cholesterol Cal: 23 mg/dL (ref 5–40)

## 2020-05-25 LAB — CARDIOVASCULAR RISK ASSESSMENT

## 2020-05-25 LAB — HEMOGLOBIN A1C
Est. average glucose Bld gHb Est-mCnc: 117 mg/dL
Hgb A1c MFr Bld: 5.7 % — ABNORMAL HIGH (ref 4.8–5.6)

## 2020-05-26 ENCOUNTER — Encounter: Payer: Self-pay | Admitting: Family Medicine

## 2020-05-26 ENCOUNTER — Ambulatory Visit (INDEPENDENT_AMBULATORY_CARE_PROVIDER_SITE_OTHER): Payer: Medicare Other | Admitting: Family Medicine

## 2020-05-26 ENCOUNTER — Other Ambulatory Visit: Payer: Self-pay

## 2020-05-26 VITALS — BP 118/76 | HR 90 | Temp 97.5°F | Ht 67.0 in | Wt 197.0 lb

## 2020-05-26 DIAGNOSIS — R7301 Impaired fasting glucose: Secondary | ICD-10-CM | POA: Diagnosis not present

## 2020-05-26 DIAGNOSIS — Z9989 Dependence on other enabling machines and devices: Secondary | ICD-10-CM

## 2020-05-26 DIAGNOSIS — E6609 Other obesity due to excess calories: Secondary | ICD-10-CM

## 2020-05-26 DIAGNOSIS — G4733 Obstructive sleep apnea (adult) (pediatric): Secondary | ICD-10-CM | POA: Diagnosis not present

## 2020-05-26 DIAGNOSIS — I10 Essential (primary) hypertension: Secondary | ICD-10-CM

## 2020-05-26 DIAGNOSIS — M5416 Radiculopathy, lumbar region: Secondary | ICD-10-CM | POA: Diagnosis not present

## 2020-05-26 DIAGNOSIS — Z6830 Body mass index (BMI) 30.0-30.9, adult: Secondary | ICD-10-CM | POA: Diagnosis not present

## 2020-05-26 DIAGNOSIS — M542 Cervicalgia: Secondary | ICD-10-CM

## 2020-05-26 DIAGNOSIS — J410 Simple chronic bronchitis: Secondary | ICD-10-CM

## 2020-05-26 DIAGNOSIS — J9611 Chronic respiratory failure with hypoxia: Secondary | ICD-10-CM

## 2020-05-26 DIAGNOSIS — E782 Mixed hyperlipidemia: Secondary | ICD-10-CM

## 2020-05-26 NOTE — Patient Instructions (Signed)
Neck pain - restart cyclobenzaprine 5 mg three times a day as needed for back or neck spasm.  For Sciatica - Increase lyrica 50 mg 2 twice a day.   Neck Exercises Ask your health care provider which exercises are safe for you. Do exercises exactly as told by your health care provider and adjust them as directed. It is normal to feel mild stretching, pulling, tightness, or discomfort as you do these exercises. Stop right away if you feel sudden pain or your pain gets worse. Do not begin these exercises until told by your health care provider. Neck exercises can be important for many reasons. They can improve strength and maintain flexibility in your neck, which will help your upper back and prevent neck pain. Stretching exercises Rotation neck stretching  1. Sit in a chair or stand up. 2. Place your feet flat on the floor, shoulder width apart. 3. Slowly turn your head (rotate) to the right until a slight stretch is felt. Turn it all the way to the right so you can look over your right shoulder. Do not tilt or tip your head. 4. Hold this position for 10-30 seconds. 5. Slowly turn your head (rotate) to the left until a slight stretch is felt. Turn it all the way to the left so you can look over your left shoulder. Do not tilt or tip your head. 6. Hold this position for 10-30 seconds. Repeat __________ times. Complete this exercise __________ times a day. Neck retraction 1. Sit in a sturdy chair or stand up. 2. Look straight ahead. Do not bend your neck. 3. Use your fingers to push your chin backward (retraction). Do not bend your neck for this movement. Continue to face straight ahead. If you are doing the exercise properly, you will feel a slight sensation in your throat and a stretch at the back of your neck. 4. Hold the stretch for 1-2 seconds. Repeat __________ times. Complete this exercise __________ times a day. Strengthening exercises Neck press 1. Lie on your back on a firm bed or on  the floor with a pillow under your head. 2. Use your neck muscles to push your head down on the pillow and straighten your spine. 3. Hold the position as well as you can. Keep your head facing up (in a neutral position) and your chin tucked. 4. Slowly count to 5 while holding this position. Repeat __________ times. Complete this exercise __________ times a day. Isometrics These are exercises in which you strengthen the muscles in your neck while keeping your neck still (isometrics). 1. Sit in a supportive chair and place your hand on your forehead. 2. Keep your head and face facing straight ahead. Do not flex or extend your neck while doing isometrics. 3. Push forward with your head and neck while pushing back with your hand. Hold for 10 seconds. 4. Do the sequence again, this time putting your hand against the back of your head. Use your head and neck to push backward against the hand pressure. 5. Finally, do the same exercise on either side of your head, pushing sideways against the pressure of your hand. Repeat __________ times. Complete this exercise __________ times a day. Prone head lifts 1. Lie face-down (prone position), resting on your elbows so that your chest and upper back are raised. 2. Start with your head facing downward, near your chest. Position your chin either on or near your chest. 3. Slowly lift your head upward. Lift until you are looking straight  ahead. Then continue lifting your head as far back as you can comfortably stretch. 4. Hold your head up for 5 seconds. Then slowly lower it to your starting position. Repeat __________ times. Complete this exercise __________ times a day. Supine head lifts 1. Lie on your back (supine position), bending your knees to point to the ceiling and keeping your feet flat on the floor. 2. Lift your head slowly off the floor, raising your chin toward your chest. 3. Hold for 5 seconds. Repeat __________ times. Complete this exercise  __________ times a day. Scapular retraction 1. Stand with your arms at your sides. Look straight ahead. 2. Slowly pull both shoulders (scapulae) backward and downward (retraction) until you feel a stretch between your shoulder blades in your upper back. 3. Hold for 10-30 seconds. 4. Relax and repeat. Repeat __________ times. Complete this exercise __________ times a day. Contact a health care provider if:  Your neck pain or discomfort gets much worse when you do an exercise.  Your neck pain or discomfort does not improve within 2 hours after you exercise. If you have any of these problems, stop exercising right away. Do not do the exercises again unless your health care provider says that you can. Get help right away if:  You develop sudden, severe neck pain. If this happens, stop exercising right away. Do not do the exercises again unless your health care provider says that you can. This information is not intended to replace advice given to you by your health care provider. Make sure you discuss any questions you have with your health care provider. Document Revised: 10/07/2018 Document Reviewed: 10/07/2018 Elsevier Patient Education  [place].

## 2020-06-12 ENCOUNTER — Telehealth (INDEPENDENT_AMBULATORY_CARE_PROVIDER_SITE_OTHER): Payer: Medicare Other | Admitting: Family Medicine

## 2020-06-12 ENCOUNTER — Encounter: Payer: Self-pay | Admitting: Family Medicine

## 2020-06-12 VITALS — BP 107/75 | HR 90 | Temp 98.6°F

## 2020-06-12 DIAGNOSIS — J9611 Chronic respiratory failure with hypoxia: Secondary | ICD-10-CM | POA: Diagnosis not present

## 2020-06-12 DIAGNOSIS — J019 Acute sinusitis, unspecified: Secondary | ICD-10-CM | POA: Insufficient documentation

## 2020-06-12 MED ORDER — AZITHROMYCIN 250 MG PO TABS: ORAL_TABLET | ORAL | 0 refills | Status: DC

## 2020-06-12 NOTE — Progress Notes (Signed)
Virtual Visit via Telephone Note   This visit type was conducted due to national recommendations for restrictions regarding the COVID-19 Pandemic (e.g. social distancing) in an effort to limit this patient's exposure and mitigate transmission in our community.  Due to his co-morbid illnesses, this patient is at least at moderate risk for complications without adequate follow up.  This format is felt to be most appropriate for this patient at this time.  The patient did not have access to video technology/had technical difficulties with video requiring transitioning to audio format only (telephone).  All issues noted in this document were discussed and addressed.  No physical exam could be performed with this format.  Patient verbally consented to a telehealth visit.   Date:  06/12/2020   ID:  William Clay, DOB [birth year], MRN [id number]  Patient Location: Home Provider Location: Office  PCP:  [name], MD   Evaluation Performed:  Acute visit  Chief Complaint: cough  History of Present Illness:    William Clay is a 79 y.o. male with cough and runny nose x 3 days has been taking otc cough medicine which has not helped. Sleeping with his head raised which does not help.Using oxygen at 2 Liters when exerts himself. Using stiolto regularly and using albuterol several times a day.  The patient does have symptoms concerning for COVID-19 infection (fever, chills, cough, or new shortness of breath).    Past Medical History:  Diagnosis Date  . Atherosclerosis of aorta ([place])   . Bowel obstruction ([place]) 2018  . BPH (benign prostatic hyperplasia)   . COPD (chronic obstructive pulmonary disease) ([place])   . COPD (chronic obstructive pulmonary disease) ([place])   . GERD (gastroesophageal reflux disease)   . Hypertension   . Hypoxemia   . Mixed hyperlipidemia   . Osteoarthritis   . Pneumonia 1308,6578  . Primary insomnia   . Sleep apnea   . Spontaneous ecchymoses     Past Surgical  History:  Procedure Laterality Date  . ABDOMINAL AORTIC ANEURYSM REPAIR    . BACK SURGERY N/A [id number], 2021   Lower back and cervical spine  . BACK SURGERY  1984   upper back  . CATARACT EXTRACTION Left 2017  . CATARACT EXTRACTION Right 2017  . CHOLECYSTECTOMY  2018  . HERNIA REPAIR  2018  . PROSTATECTOMY    . ROTATOR CUFF REPAIR Right 2015  . ROTATOR CUFF REPAIR Left 2017    Family History  Problem Relation Age of Onset  . Heart failure Mother   . Diabetes Mother   . CAD Mother   . Hypertension Mother     Social History   Socioeconomic History  . Marital status: Married    Spouse name: Not on file  . Number of children: 2  . Years of education: Not on file  . Highest education level: Not on file  Occupational History  . Occupation: retired    Comment: Event organiser  Tobacco Use  . Smoking status: Former Smoker    Packs/day: 2.00    Years: 50.00    Pack years: 100.00    Types: Cigarettes    Quit date: 12/24/2007    Years since quitting: 12.4  . Smokeless tobacco: Never Used  Vaping Use  . Vaping Use: Never used  Substance and Sexual Activity  . Alcohol use: No    Alcohol/week: 0.0 standard drinks  . Drug use: No  . Sexual activity: Not on file  Other Topics Concern  .  Not on file  Social History Narrative  . Not on file   Social Determinants of Health   Financial Resource Strain:   . Difficulty of Paying Living Expenses:   Food Insecurity: No Food Insecurity  . Worried About Charity fundraiser in the Last Year: Never true  . Ran Out of Food in the Last Year: Never true  Transportation Needs:   . Lack of Transportation (Medical):   Marland Kitchen Lack of Transportation (Non-Medical):   Physical Activity:   . Days of Exercise per Week:   . Minutes of Exercise per Session:   Stress:   . Feeling of Stress :   Social Connections:   . Frequency of Communication with Friends and Family:   . Frequency of Social Gatherings with Friends  and Family:   . Attends Religious Services:   . Active Member of Clubs or Organizations:   . Attends Archivist Meetings:   Marland Kitchen Marital Status:   Intimate Partner Violence:   . Fear of Current or Ex-Partner:   . Emotionally Abused:   Marland Kitchen Physically Abused:   . Sexually Abused:     Outpatient Medications Prior to Visit  Medication Sig Dispense Refill  . albuterol (PROVENTIL HFA;VENTOLIN HFA) 108 (90 Base) MCG/ACT inhaler Inhale two puffs every four to six hours as needed for cough or wheeze. 1 Inhaler 1  . amLODipine (NORVASC) 10 MG tablet Take 10 mg by mouth daily.    . cetirizine (ZYRTEC) 10 MG tablet Take 1 tablet (10 mg total) by mouth daily. 90 tablet 3  . cyclobenzaprine (FLEXERIL) 5 MG tablet Take 1 tablet (5 mg total) by mouth 3 (three) times daily as needed for muscle spasms. (Patient not taking: Reported on 05/10/2020) 90 tablet 0  . losartan (COZAAR) 100 MG tablet Take 1 tablet (100 mg total) by mouth daily. 90 tablet 1  . meloxicam (MOBIC) 15 MG tablet Take 15 mg by mouth daily.    . Multiple Vitamins-Minerals (MULTIVITAMIN WITH MINERALS) tablet Take 1 tablet by mouth daily.    . Omega-3 Fatty Acids (FISH OIL) 1000 MG CAPS Take 1,000 mg by mouth 2 (two) times daily.    Marland Kitchen omeprazole (PRILOSEC) 40 MG capsule Take 1 capsule (40 mg total) by mouth daily. 90 capsule 1  . pravastatin (PRAVACHOL) 20 MG tablet Take 1 tablet (20 mg total) by mouth daily. 90 tablet 1  . pregabalin (LYRICA) 50 MG capsule Take 100 mg by mouth 2 (two) times daily.     . Probiotic Product (RESTORA PO) Take 1 tablet by mouth daily.    . sertraline (ZOLOFT) 100 MG tablet Take 1 tablet (100 mg total) by mouth daily. 90 tablet 3  . Tiotropium Bromide-Olodaterol (STIOLTO RESPIMAT) 2.5-2.5 MCG/ACT AERS Inhale 2 puffs into the lungs daily.      No facility-administered medications prior to visit.   .med Allergies:   Cefadroxil, Lisinopril, Simvastatin, Amoxicillin, Gabapentin, Metoprolol, and Potassium  clavulanate [clavulanic acid]   Social History   Tobacco Use  . Smoking status: Former Smoker    Packs/day: 2.00    Years: 50.00    Pack years: 100.00    Types: Cigarettes    Quit date: 12/24/2007    Years since quitting: 12.4  . Smokeless tobacco: Never Used  Vaping Use  . Vaping Use: Never used  Substance Use Topics  . Alcohol use: No    Alcohol/week: 0.0 standard drinks  . Drug use: No     Review of Systems  Constitutional: Negative for chills and fever.  HENT: Positive for sore throat. Negative for congestion and ear pain.   Respiratory: Positive for cough. Negative for shortness of breath.   Cardiovascular: Negative for chest pain.  Musculoskeletal: Positive for myalgias. Negative for joint pain.  Neurological: Negative for headaches.     Labs/Other Tests and Data Reviewed:    Recent Labs: 05/24/2020: ALT 18; BUN 14; Creatinine, Ser 0.95; Hemoglobin 13.1; Platelets 298; Potassium 3.8; Sodium 140   Recent Lipid Panel Lab Results  Component Value Date/Time   CHOL 161 05/24/2020 07:44 AM   TRIG 126 05/24/2020 07:44 AM   HDL 37 (L) 05/24/2020 07:44 AM   CHOLHDL 4.4 05/24/2020 07:44 AM   LDLCALC 101 (H) 05/24/2020 07:44 AM    Wt Readings from Last 3 Encounters:  05/26/20 197 lb (89.4 kg)  05/04/20 183 lb (83 kg)  02/29/20 192 lb (87.1 kg)     Objective:    Vital Signs:  BP 107/75   Pulse 90   Temp 98.6 F (37 C)   SpO2 93% Comment: on room air.   Physical Exam  Hoarse.   ASSESSMENT & PLAN:   1. Acute non-recurrent sinusitis, unspecified location Zpack sent.  Recommend mucinex DM for cough.  2. Chronic respiratory failure with hypoxia (HCC) Use oxygen at 2 L   COVID-19 Education: The signs and symptoms of COVID-19 were discussed with the patient and how to seek care for testing (follow up with PCP or arrange E-visit). The importance of social distancing was discussed today.  Time:   Today, I have spent 10 minutes with the patient with telehealth  technology discussing the above problems.    Follow Up:  In Person prn  Signed, [name], MD  06/12/2020 10:20 AM    [place]

## 2020-07-11 DIAGNOSIS — M961 Postlaminectomy syndrome, not elsewhere classified: Secondary | ICD-10-CM | POA: Diagnosis not present

## 2020-07-11 DIAGNOSIS — M4726 Other spondylosis with radiculopathy, lumbar region: Secondary | ICD-10-CM | POA: Diagnosis not present

## 2020-07-11 DIAGNOSIS — M4326 Fusion of spine, lumbar region: Secondary | ICD-10-CM | POA: Diagnosis not present

## 2020-07-11 DIAGNOSIS — M4316 Spondylolisthesis, lumbar region: Secondary | ICD-10-CM | POA: Diagnosis not present

## 2020-07-25 ENCOUNTER — Other Ambulatory Visit: Payer: Self-pay | Admitting: Family Medicine

## 2020-08-07 DIAGNOSIS — L570 Actinic keratosis: Secondary | ICD-10-CM | POA: Diagnosis not present

## 2020-08-07 DIAGNOSIS — C44519 Basal cell carcinoma of skin of other part of trunk: Secondary | ICD-10-CM | POA: Diagnosis not present

## 2020-08-09 ENCOUNTER — Telehealth: Payer: Medicare Other

## 2020-08-09 ENCOUNTER — Telehealth: Payer: Self-pay

## 2020-08-09 NOTE — Progress Notes (Signed)
Unsuccessful outreach to patient. Called for Medication adherence call. Left voicemail for patient to return call.  Martinique Uselman, [facility] Pharmacist Assistant  [phone]

## 2020-08-21 DIAGNOSIS — H26493 Other secondary cataract, bilateral: Secondary | ICD-10-CM | POA: Diagnosis not present

## 2020-08-22 ENCOUNTER — Telehealth: Payer: Self-pay

## 2020-08-22 DIAGNOSIS — E782 Mixed hyperlipidemia: Secondary | ICD-10-CM

## 2020-08-22 DIAGNOSIS — I10 Essential (primary) hypertension: Secondary | ICD-10-CM

## 2020-08-22 NOTE — Chronic Care Management (AMB) (Signed)
Chronic Care Management Pharmacy  Name: William Clay  MRN: [id number] DOB: Jun 09, 1941  Chief Complaint/ HPI  William Clay,  79 y.o. , male presents for their Follow-Up CCM visit with the clinical pharmacist via telephone due to COVID-19 Pandemic.  PCP : [name], MD  Their chronic conditions include: HTN, CAD, COPD, HLD, Depression.   Office Visits: 06/12/2020 - sinusitis - zpack given. Mucinex DM recommended OTC.  05/26/2020 - increase Lyrica to 50 mg bid. Restart flexeril 5 mg tid prn.  05/04/2020 - Post-op discharge visit. Flexeril for back pain prescribed.  02/29/2020 - Pre-op clearance for spine/scoliosis surgery clearance. Besides his back pain pt voices no other concerns or problems and recently had chronic follow up visit and labwork which was all Clay.  02/21/2020 - Well controlled. No changes to medicines.  Continue to work on eating a healthy diet and exercise. 11/17/2019 - Medicare physical - started on sertraline for irritability/anger. Increased pravastatin for elevated TG and LDL on lipid panel.  10/20/2019 - flu shot given Consult Visit: 04/12/2020 - Admitted to inpatient rehab after revision of l3-l5 laminectomy.  04/10/2020 - Revision of l3-l5 procedure at [facility].   Medications: Outpatient Encounter Medications as of 08/22/2020  Medication Sig   albuterol (PROVENTIL HFA;VENTOLIN HFA) 108 (90 Base) MCG/ACT inhaler Inhale two puffs every four to six hours as needed for cough or wheeze.   amLODipine (NORVASC) 10 MG tablet TAKE 1 TABLET BY MOUTH ONCE DAILY   azithromycin (ZITHROMAX) 250 MG tablet 2 DAILY FOR FIRST DAY, THEN DECREASE TO ONE DAILY FOR 4 MORE DAYS.   cetirizine (ZYRTEC) 10 MG tablet Take 1 tablet (10 mg total) by mouth daily.   cyclobenzaprine (FLEXERIL) 5 MG tablet Take 1 tablet (5 mg total) by mouth 3 (three) times daily as needed for muscle spasms. (Patient not taking: Reported on 05/10/2020)   losartan (COZAAR) 100 MG tablet Take 1  tablet (100 mg total) by mouth daily.   meloxicam (MOBIC) 15 MG tablet Take 15 mg by mouth daily.   Multiple Vitamins-Minerals (MULTIVITAMIN WITH MINERALS) tablet Take 1 tablet by mouth daily.   Omega-3 Fatty Acids (FISH OIL) 1000 MG CAPS Take 1,000 mg by mouth 2 (two) times daily.   omeprazole (PRILOSEC) 40 MG capsule Take 1 capsule (40 mg total) by mouth daily.   pravastatin (PRAVACHOL) 20 MG tablet Take 1 tablet (20 mg total) by mouth daily.   pregabalin (LYRICA) 50 MG capsule Take 100 mg by mouth 2 (two) times daily.    Probiotic Product (RESTORA PO) Take 1 tablet by mouth daily.   sertraline (ZOLOFT) 100 MG tablet Take 1 tablet (100 mg total) by mouth daily.   Tiotropium Bromide-Olodaterol (STIOLTO RESPIMAT) 2.5-2.5 MCG/ACT AERS Inhale 2 puffs into the lungs daily.    No facility-administered encounter medications on file as of 08/22/2020.   Allergies  Allergen Reactions   Cefadroxil Shortness Of Breath   Lisinopril Other (See Comments)    Dyspnea Dyspnea    Simvastatin Other (See Comments)    Muscle Cramps Muscle Cramps    Amoxicillin     Mouth sores    Gabapentin    Metoprolol Other (See Comments)    Leg cramps   Potassium Clavulanate [Clavulanic Acid]     diarrhea    SDOH Screenings   Alcohol Screen:    Last Alcohol Screening Score (AUDIT): Not on file  Depression (PHQ2-9): Low Risk    PHQ-2 Score: 0  Financial Resource Strain:    Difficulty  of Paying Living Expenses: Not on file  Food Insecurity: No Food Insecurity   Worried About [place] in the Last Year: Never true   Ran Out of Food in the Last Year: Never true  Housing: Low Risk    Last Housing Risk Score: 0  Physical Activity:    Days of Exercise per Week: Not on file   Minutes of Exercise per Session: Not on file  Social Connections:    Frequency of Communication with Friends and Family: Not on file   Frequency of Social Gatherings with Friends and Family: Not on  file   Attends Religious Services: Not on file   Active Member of Clubs or Organizations: Not on file   Attends Archivist Meetings: Not on file   Marital Status: Not on file  Stress:    Feeling of Stress : Not on file  Tobacco Use: Medium Risk   Smoking Tobacco Use: Former Smoker   Smokeless Tobacco Use: Never Used  Transportation Needs:    Film/video editor (Medical): Not on file   Lack of Transportation (Non-Medical): Not on file     Current Diagnosis/Assessment:  Goals Addressed            This Visit's Progress    Pharmacy Care Plan       CARE PLAN ENTRY  Current Barriers:   Chronic Disease Management support, education, and care coordination needs related to Hypertension and Hyperlipidemia   Hypertension  Pharmacist Clinical Goal(s): o Over the next 90 days, patient will work with PharmD and providers to maintain BP goal <140/90  Current regimen:  o Amlodipine 10 mg daily and Losartan 100 mg daily.   Interventions: o Encouraged patient continue the good work with managing blood pressure.  o Patient has resumed walking to mailbox after surgery.   Patient self care activities - Over the next 90 days, patient will: o Check BP weekly, document, and provide at future appointments o Ensure daily salt intake < 2300 mg/day  Hyperlipidemia  Pharmacist Clinical Goal(s): o Over the next 90 days, patient will work with PharmD and providers to maintain LDL goal < 100  Current regimen:  o Pravastatin 20 mg and Omega 3 1000 mg bid.  Interventions: o Discussed elevated Triglyceride levels. o Patient eating lots of garden vegetables this Summer.   Patient self care activities - Over the next 90 days, patient will: o Recommend patient work to eat a low fat, heart healthy diet in order to improve next Triglyceride levels.  Medication management  Pharmacist Clinical Goal(s): o Over the next 90 days, patient will work with PharmD and providers  to maintain optimal medication adherence  Current pharmacy: [facility]  Interventions o Comprehensive medication review performed. o Continue current medication management strategy  Patient self care activities - Over the next 90 days, patient will: o Focus on medication adherence by continuing to use pill box.  o Take medications as prescribed o Report any questions or concerns to PharmD and/or provider(s)  Please see past updates related to this goal by clicking on the "Past Updates" button in the selected goal         COPD / Asthma / Tobacco   Last spirometry score: 2.46 @ 87%                           Eos (Absolute):  Lab Results  Component Value Date/Time   EOSABS 0.1 05/24/2020 07:44 AM  Tobacco Status:  Social History   Tobacco Use  Smoking Status Former Smoker   Packs/day: 2.00   Years: 50.00   Pack years: 100.00   Types: Cigarettes   Quit date: 12/24/2007   Years since quitting: 12.6  Smokeless Tobacco Never Used    Patient has failed these meds in past: asmanex, breo ellipta, singulair, duoneb Patient is currently controlled on the following medications: albuterol prn, Stiolto respimat, Incruse Ellipta, oxygen Using maintenance inhaler regularly? Yes Frequency of rescue inhaler use:  daily  We discussed:  proper inhaler technique. Patient reports that is well managed currently. He is on Oxygen. He receives his Stiolto from the [facility] due to cost at [facility].   Update 08/22/2020 - Patient has been able to resume walking to mailbox post-surgery. The heat has limited some of his activities.   Plan  Continue current medications ,  Hypertension   BP today is:  <140/90  Lab Results  Component Value Date   CREATININE 0.95 05/24/2020   CREATININE 0.95 05/04/2020   CREATININE 0.94 02/21/2020   Office blood pressures are  BP Readings from Last 3 Encounters:  06/12/20 107/75  05/26/20 118/76  05/04/20 134/68    Patient has  failed these meds in the past: valsartan, lisinopril Patient is currently controlled on the following medications: amlodipine 10 mg daily, losartan 100 mg daily Patient checks BP at home weekly  Patient home BP readings are ranging: 120/80 mmHg  We discussed diet and exercise extensively   Update 08/22/2020 - Patient is eating a healthy diet this summer of lots of garden vegetables. Reports good bp control at home.   Plan  Continue current medications   Hyperlipidemia/CAD   Lipid Panel     Component Value Date/Time   CHOL 161 05/24/2020 0744   TRIG 126 05/24/2020 0744   HDL 37 (L) 05/24/2020 0744   CHOLHDL 4.4 05/24/2020 0744   LDLCALC 101 (H) 05/24/2020 0744   LABVLDL 23 05/24/2020 0744    CMP Latest Ref Rng & Units 05/24/2020 05/04/2020 02/21/2020  Glucose 65 - 99 mg/dL 93 110(H) 89  BUN 8 - 27 mg/dL 14 16 11   Creatinine 0.76 - 1.27 mg/dL 0.95 0.95 0.94  Sodium 134 - 144 mmol/L 140 142 140  Potassium 3.5 - 5.2 mmol/L 3.8 4.1 4.6  Chloride 96 - 106 mmol/L 104 104 105  CO2 20 - 29 mmol/L 22 21 -  Calcium 8.6 - 10.2 mg/dL 9.7 10.0 9.8  Total Protein 6.0 - 8.5 g/dL 6.9 6.8 6.8  Total Bilirubin 0.0 - 1.2 mg/dL 0.6 0.7 0.7  Alkaline Phos 48 - 121 IU/L 108 90 86  AST 0 - 40 IU/L 20 25 31   ALT 0 - 44 IU/L 18 16 -    The 10-year ASCVD risk score Mikey Bussing DC Jr., et al., 2013) is: 23.4%   Values used to calculate the score:     Age: 66 years     Sex: Male     Is Non-Hispanic African American: No     Diabetic: No     Tobacco smoker: No     Systolic Blood Pressure: 098 mmHg     Is BP treated: Yes     HDL Cholesterol: 37 mg/dL     Total Cholesterol: 161 mg/dL   Patient has failed these meds in past: simvastatin Patient is currently uncontrolled on the following medications: omega 3 1000 mg bid, pravastatin 20 mg daily.  We discussed:  diet and exercise extensively. His  diet varies from day to day.  Eats cereal with a banana in it with coffee for breakfast. May eat a sandwich for  lunch. Chicken and vegetables for suppers. He has cut out most bread except for an occasional sandwich or breakfast. Discussed elevated triglycerides and being mindful with diet.   Update 08/22/2020 - Patient reports healthy diet of garden vegetables.   Plan  Continue current medications    and   Lumbar Stenosis   Patient has failed these meds in past: tramadol, lyrica, gabapentin, meloxicam Patient is currently controlled on the following medications: pregabalin 50 mg bid  We discussed:  Patient is recovering from a recent surgery. He is doing physical therapy at home. He is off of meloxicam right now but hopes to resume taking in 2 more months.   Update 08/22/2020 - Patient has been able to resume walking to his mailbox. He was unable to plant a garden this summer due to recovery from back surgery but has neighbors and family who have been sharing.   Plan  Continue current medications   GERD   Patient has failed these meds in past: famotidine 40 mg, ranitidine 300 mg  Patient is currently controlled on the following medications: omeprazole 40 mg daily  We discussed:  Patient states that he is taking bid. Bottle states daily. He was previously using omeprazole 20 mg bid. We discussed and patient is going to try to just take in the am and see how symptoms respond. He will let pharmacist know if needs to be increased to twice daily.   Update 08/22/2020 - Patient reports a few instances of diarrhea lately. He is uncertain origin but plans to purchase and take a dose of imodium. Pharmacist encouraged patient to contact provider if continues. Patient feels that it is coming from all of the garden vegetables he is eating.   Plan  Continue current medications   Health Maintenance   Patient is currently controlled on the following medications:  Cetirizine 10 mg daily - allergies Multivitamin daily - general health Probiotic - general health We discussed:  Patient reports that he  is well managed and recovering well.    Plan  Continue current medications  Vaccines   Reviewed and discussed patient's vaccination history.    Immunization History  Administered Date(s) Administered   Influenza Split 08/24/2015   Moderna SARS-COVID-2 Vaccination 02/21/2020   Pneumococcal Conjugate-13 10/23/2014    Plan  Recommended patient receive annual flu vaccine in office.  Medication Management   [facility] for all medications Uses pill box? Yes - stores medication in a tote bag from [facility]. He uses a weekly pill organizer as well.  Pt endorses excellent compliance  We discussed: He rarely misses doses of medication.   Plan  Continue current medication management strategy    Follow up: 3 month phone visit

## 2020-08-22 NOTE — Patient Instructions (Addendum)
Visit Information  Goals Addressed            This Visit's Progress   . Pharmacy Care Plan       CARE PLAN ENTRY  Current Barriers:  . Chronic Disease Management support, education, and care coordination needs related to Hypertension and Hyperlipidemia   Hypertension . Pharmacist Clinical Goal(s): o Over the next 90 days, patient will work with PharmD and providers to maintain BP goal <140/90 . Current regimen:  o Amlodipine 10 mg daily and Losartan 100 mg daily.  . Interventions: o Encouraged patient continue the good work with managing blood pressure.  o Patient has resumed walking to mailbox after surgery.  . Patient self care activities - Over the next 90 days, patient will: o Check BP weekly, document, and provide at future appointments o Ensure daily salt intake < 2300 mg/day  Hyperlipidemia . Pharmacist Clinical Goal(s): o Over the next 90 days, patient will work with PharmD and providers to maintain LDL goal < 100 . Current regimen:  o Pravastatin 20 mg and Omega 3 1000 mg bid. . Interventions: o Discussed elevated Triglyceride levels. o Patient eating lots of garden vegetables this Summer.  . Patient self care activities - Over the next 90 days, patient will: o Recommend patient work to eat a low fat, heart healthy diet in order to improve next Triglyceride levels.  Medication management . Pharmacist Clinical Goal(s): o Over the next 90 days, patient will work with PharmD and providers to maintain optimal medication adherence . Current pharmacy: [facility] . Interventions o Comprehensive medication review performed. o Continue current medication management strategy . Patient self care activities - Over the next 90 days, patient will: o Focus on medication adherence by continuing to use pill box.  o Take medications as prescribed o Report any questions or concerns to PharmD and/or provider(s)  Please see past updates related to this goal by clicking on the  "Past Updates" button in the selected goal         The patient verbalized understanding of instructions provided today and declined a print copy of patient instruction materials.   Telephone follow up appointment with pharmacy team member scheduled for:10/2020  Sherre Poot, PharmD, [facility] Clinical Pharmacist [facility] [phone] (office) [phone] (mobile)   Exercises To Do While Sitting  Exercises that you do while sitting (chair exercises) can give you many of the same benefits as full exercise. Benefits include strengthening your heart, burning calories, and keeping muscles and joints healthy. Exercise can also improve your mood and help with depression and anxiety. You may benefit from chair exercises if you are unable to do standing exercises because of:  Diabetic foot pain.  Obesity.  Illness.  Arthritis.  Recovery from surgery or injury.  Breathing problems.  Balance problems.  Another type of disability. Before starting chair exercises, check with your health care provider or a physical therapist to find out how much exercise you can tolerate and which exercises are safe for you. If your health care provider approves:  Start out slowly and build up over time. Aim to work up to about 10-20 minutes for each exercise session.  Make exercise part of your daily routine.  Drink water when you exercise. Do not wait until you are thirsty. Drink every 10-15 minutes.  Stop exercising right away if you have pain, nausea, shortness of breath, or dizziness.  If you are exercising in a wheelchair, make sure to lock the wheels.  Ask your  health care provider whether you can do tai chi or yoga. Many positions in these mind-body exercises can be modified to do while seated. Warm-up Before starting other exercises: 1. Sit up as straight as you can. Have your knees bent at 90 degrees, which is the shape of the capital letter "L." Keep your feet flat on the  floor. 2. Sit at the front edge of your chair, if you can. 3. Pull in (tighten) the muscles in your abdomen and stretch your spine and neck as straight as you can. Hold this position for a few minutes. 4. Breathe in and out evenly. Try to concentrate on your breathing, and relax your mind. Stretching Exercise A: Arm stretch 1. Hold your arms out straight in front of your body. 2. Bend your hands at the wrist with your fingers pointing up, as if signaling someone to stop. Notice the slight tension in your forearms as you hold the position. 3. Keeping your arms out and your hands bent, rotate your hands outward as far as you can and hold this stretch. Aim to have your thumbs pointing up and your pinkie fingers pointing down. Slowly repeat arm stretches for one minute as tolerated. Exercise B: Leg stretch 1. If you can move your legs, try to "draw" letters on the floor with the toes of your foot. Write your name with one foot. 2. Write your name with the toes of your other foot. Slowly repeat the movements for one minute as tolerated. Exercise C: Reach for the sky 1. Reach your hands as far over your head as you can to stretch your spine. 2. Move your hands and arms as if you are climbing a rope. Slowly repeat the movements for one minute as tolerated. Range of motion exercises Exercise A: Shoulder roll 1. Let your arms hang loosely at your sides. 2. Lift just your shoulders up toward your ears, then let them relax back down. 3. When your shoulders feel loose, rotate your shoulders in backward and forward circles. Do shoulder rolls slowly for one minute as tolerated. Exercise B: March in place 1. As if you are marching, pump your arms and lift your legs up and down. Lift your knees as high as you can. ? If you are unable to lift your knees, just pump your arms and move your ankles and feet up and down. March in place for one minute as tolerated. Exercise C: Seated jumping jacks 1. Let your  arms hang down straight. 2. Keeping your arms straight, lift them up over your head. Aim to point your fingers to the ceiling. 3. While you lift your arms, straighten your legs and slide your heels along the floor to your sides, as wide as you can. 4. As you bring your arms back down to your sides, slide your legs back together. ? If you are unable to use your legs, just move your arms. Slowly repeat seated jumping jacks for one minute as tolerated. Strengthening exercises Exercise A: Shoulder squeeze 1. Hold your arms straight out from your body to your sides, with your elbows bent and your fists pointed at the ceiling. 2. Keeping your arms in the bent position, move them forward so your elbows and forearms meet in front of your face. 3. Open your arms back out as wide as you can with your elbows still bent, until you feel your shoulder blades squeezing together. Hold for 5 seconds. Slowly repeat the movements forward and backward for one minute as tolerated.  Contact a health care provider if you:  Had to stop exercising due to any of the following: ? Pain. ? Nausea. ? Shortness of breath. ? Dizziness. ? Fatigue.  Have significant pain or soreness after exercising. Get help right away if you have:  Chest pain.  Difficulty breathing. These symptoms may represent a serious problem that is an emergency. Do not wait to see if the symptoms will go away. Get medical help right away. Call your local emergency services (911 in the [place] Do not drive yourself to the hospital. This information is not intended to replace advice given to you by your health care provider. Make sure you discuss any questions you have with your health care provider. Document Revised: 04/01/2019 Document Reviewed: 10/22/2017 Elsevier Patient Education  2020 [employer].

## 2020-09-10 DIAGNOSIS — L03113 Cellulitis of right upper limb: Secondary | ICD-10-CM | POA: Diagnosis not present

## 2020-09-10 DIAGNOSIS — I10 Essential (primary) hypertension: Secondary | ICD-10-CM | POA: Diagnosis not present

## 2020-09-12 ENCOUNTER — Other Ambulatory Visit: Payer: Self-pay

## 2020-09-12 ENCOUNTER — Other Ambulatory Visit: Payer: Self-pay | Admitting: Family Medicine

## 2020-09-12 ENCOUNTER — Encounter: Payer: Self-pay | Admitting: Family Medicine

## 2020-09-12 ENCOUNTER — Ambulatory Visit (INDEPENDENT_AMBULATORY_CARE_PROVIDER_SITE_OTHER): Payer: Medicare Other | Admitting: Family Medicine

## 2020-09-12 VITALS — BP 110/70 | HR 72 | Temp 97.7°F | Resp 18 | Ht 69.0 in | Wt 192.0 lb

## 2020-09-12 DIAGNOSIS — Z23 Encounter for immunization: Secondary | ICD-10-CM

## 2020-09-12 DIAGNOSIS — L03011 Cellulitis of right finger: Secondary | ICD-10-CM

## 2020-09-12 DIAGNOSIS — L03113 Cellulitis of right upper limb: Secondary | ICD-10-CM | POA: Diagnosis not present

## 2020-09-12 NOTE — Patient Instructions (Signed)
Complete antibiotics. - Ambulatory referral to Dermatology Take photos today so Dr. Jimmye Norman has a comparison.

## 2020-09-12 NOTE — Progress Notes (Signed)
Subjective:  Patient ID: William Clay, male    DOB: 1941-02-17  Age: 79 y.o. MRN: [id number]  Chief Complaint  Patient presents with  . Follow-up    HPI Pt was at [facility] and had to got the [facility] for cellulitis. Two weeks ago rt hand 2nd digit became infected around the nail. Pt tried to lance it with scissors that had been cleansed with alcohol. He got some pus out. He hit doorframe of house on 09/07/2020 on right forearm resulting in a skin tear. Then while out of town skin on forearm became hot and red. Pt has had other sores come up on upper right arm , shoulder, forearm, and head with in the last 2 weeks. Pt was started on keflex and bactrim ds. Cellulitis has improved. Finger nail still looks better, but not resolved.   Current Outpatient Medications on File Prior to Visit  Medication Sig Dispense Refill  . cephALEXin (KEFLEX) 500 MG capsule Take 500 mg by mouth 2 (two) times daily.    Marland Kitchen sulfamethoxazole-trimethoprim (BACTRIM DS) 800-160 MG tablet Take 1 tablet by mouth 2 (two) times daily.    Marland Kitchen albuterol (PROVENTIL HFA;VENTOLIN HFA) 108 (90 Base) MCG/ACT inhaler Inhale two puffs every four to six hours as needed for cough or wheeze. 1 Inhaler 1  . amLODipine (NORVASC) 10 MG tablet TAKE 1 TABLET BY MOUTH ONCE DAILY 90 tablet 1  . cetirizine (ZYRTEC) 10 MG tablet Take 1 tablet (10 mg total) by mouth daily. 90 tablet 3  . losartan (COZAAR) 100 MG tablet Take 1 tablet (100 mg total) by mouth daily. 90 tablet 1  . meloxicam (MOBIC) 15 MG tablet Take 15 mg by mouth daily.    . Multiple Vitamins-Minerals (MULTIVITAMIN WITH MINERALS) tablet Take 1 tablet by mouth daily.    . Omega-3 Fatty Acids (FISH OIL) 1000 MG CAPS Take 1,000 mg by mouth 2 (two) times daily.    Marland Kitchen omeprazole (PRILOSEC) 40 MG capsule Take 1 capsule (40 mg total) by mouth daily. 90 capsule 1  . pravastatin (PRAVACHOL) 20 MG tablet Take 1 tablet (20 mg total) by mouth daily. 90 tablet 1  . pregabalin  (LYRICA) 50 MG capsule Take 100 mg by mouth 2 (two) times daily.     . Probiotic Product (RESTORA PO) Take 1 tablet by mouth daily.    . sertraline (ZOLOFT) 100 MG tablet Take 1 tablet (100 mg total) by mouth daily. 90 tablet 3  . Tiotropium Bromide-Olodaterol (STIOLTO RESPIMAT) 2.5-2.5 MCG/ACT AERS Inhale 2 puffs into the lungs daily.      No current facility-administered medications on file prior to visit.   Past Medical History:  Diagnosis Date  . Atherosclerosis of aorta ([place])   . Bowel obstruction ([place]) 2018  . BPH (benign prostatic hyperplasia)   . COPD (chronic obstructive pulmonary disease) ([place])   . COPD (chronic obstructive pulmonary disease) ([place])   . GERD (gastroesophageal reflux disease)   . Hypertension   . Hypoxemia   . Mixed hyperlipidemia   . Osteoarthritis   . Pneumonia 8416,6063  . Primary insomnia   . Sleep apnea   . Spontaneous ecchymoses    Past Surgical History:  Procedure Laterality Date  . ABDOMINAL AORTIC ANEURYSM REPAIR    . BACK SURGERY N/A [phone], 2021   Lower back and cervical spine  . BACK SURGERY  1984   upper back  . CATARACT EXTRACTION Left 2017  . CATARACT EXTRACTION Right 2017  .  CHOLECYSTECTOMY  2018  . HERNIA REPAIR  2018  . PROSTATECTOMY    . ROTATOR CUFF REPAIR Right 2015  . ROTATOR CUFF REPAIR Left 2017    Family History  Problem Relation Age of Onset  . Heart failure Mother   . Diabetes Mother   . CAD Mother   . Hypertension Mother    Social History   Socioeconomic History  . Marital status: Married    Spouse name: Not on file  . Number of children: 2  . Years of education: Not on file  . Highest education level: Not on file  Occupational History  . Occupation: retired    Comment: Event organiser  Tobacco Use  . Smoking status: Former Smoker    Packs/day: 2.00    Years: 50.00    Pack years: 100.00    Types: Cigarettes    Quit date: 12/24/2007    Years since quitting: 12.7  . Smokeless  tobacco: Never Used  Vaping Use  . Vaping Use: Never used  Substance and Sexual Activity  . Alcohol use: No    Alcohol/week: 0.0 standard drinks  . Drug use: No  . Sexual activity: Not on file  Other Topics Concern  . Not on file  Social History Narrative  . Not on file   Social Determinants of Health   Financial Resource Strain:   . Difficulty of Paying Living Expenses: Not on file  Food Insecurity: No Food Insecurity  . Worried About Charity fundraiser in the Last Year: Never true  . Ran Out of Food in the Last Year: Never true  Transportation Needs:   . Lack of Transportation (Medical): Not on file  . Lack of Transportation (Non-Medical): Not on file  Physical Activity:   . Days of Exercise per Week: Not on file  . Minutes of Exercise per Session: Not on file  Stress:   . Feeling of Stress : Not on file  Social Connections:   . Frequency of Communication with Friends and Family: Not on file  . Frequency of Social Gatherings with Friends and Family: Not on file  . Attends Religious Services: Not on file  . Active Member of Clubs or Organizations: Not on file  . Attends Archivist Meetings: Not on file  . Marital Status: Not on file    Review of Systems  Constitutional: Negative for chills, fatigue and fever.  HENT: Negative for congestion, ear pain and sore throat.   Respiratory: Positive for cough and shortness of breath.   Cardiovascular: Positive for chest pain.  Gastrointestinal: Positive for abdominal pain (began with hernia repair 2 years ago. ) and diarrhea. Negative for constipation, nausea and vomiting.  Endocrine: Negative for polydipsia, polyphagia and polyuria.  Genitourinary: Negative for dysuria and frequency.  Musculoskeletal: Positive for arthralgias, back pain (baseline) and myalgias.  Neurological: Positive for weakness and headaches (left side of head. has had for months. May be getting a little worse. Tylenol helps.). Negative for  dizziness.  Psychiatric/Behavioral: Negative for dysphoric mood.       No dysphoria     Objective:  BP 110/70   Pulse 72   Temp 97.7 F (36.5 C)   Resp 18   Ht 5\' 9"  (1.753 m)   Wt 192 lb (87.1 kg)   BMI 28.35 kg/m   BP/Weight 09/12/2020 0/63/0160 1/0/9323  Systolic BP 557 322 025  Diastolic BP 70 75 76  Wt. (Lbs) 192 - 197  BMI 28.35 - 30.85  Physical Exam Vitals reviewed.  Constitutional:      Appearance: Normal appearance. He is obese.  Cardiovascular:     Rate and Rhythm: Normal rate and regular rhythm.     Heart sounds: Normal heart sounds.  Pulmonary:     Effort: Pulmonary effort is normal.     Breath sounds: Normal breath sounds.  Skin:    Findings: Lesion (right forearm: abrasion healing. scab on anterior surface also. scab on scalp on rt side. rt index finger; callused on palmar surface. around nail erythema. no fluctuance. nontender. ) present.  Neurological:     Mental Status: He is alert and oriented to person, place, and time.  Psychiatric:        Mood and Affect: Mood normal.        Behavior: Behavior normal.     Diabetic Foot Exam - Simple   No data filed       Lab Results  Component Value Date   WBC 11.9 (H) 05/24/2020   HGB 13.1 05/24/2020   HCT 40.3 05/24/2020   PLT 298 05/24/2020   GLUCOSE 93 05/24/2020   CHOL 161 05/24/2020   TRIG 126 05/24/2020   HDL 37 (L) 05/24/2020   LDLCALC 101 (H) 05/24/2020   ALT 18 05/24/2020   AST 20 05/24/2020   NA 140 05/24/2020   K 3.8 05/24/2020   CL 104 05/24/2020   CREATININE 0.95 05/24/2020   BUN 14 05/24/2020   CO2 22 05/24/2020   HGBA1C 5.7 (H) 05/24/2020      Assessment & Plan:  1. Paronychia of finger of right hand Complete antibiotics. - Ambulatory referral to Dermatology Take photos today.  2. Cellulitis of right upper Complete antibiotics. - Ambulatory referral to Dermatology Take photos today.extremity Complete antibiotics. Take photos today.  - Ambulatory referral to  Dermatology  3. Need for immunization against influenza - Flu Vaccine QUAD High Dose(Fluad)  Follow-up: Return if symptoms worsen or fail to improve.  An After Visit Summary was printed and given to the patient.  [name] [facility] [phone]

## 2020-09-22 ENCOUNTER — Other Ambulatory Visit: Payer: Medicare Other

## 2020-09-25 ENCOUNTER — Ambulatory Visit: Payer: Medicare Other | Admitting: Family Medicine

## 2020-10-02 ENCOUNTER — Other Ambulatory Visit: Payer: Medicare Other

## 2020-10-02 ENCOUNTER — Other Ambulatory Visit: Payer: Self-pay

## 2020-10-02 DIAGNOSIS — R7301 Impaired fasting glucose: Secondary | ICD-10-CM

## 2020-10-02 DIAGNOSIS — I10 Essential (primary) hypertension: Secondary | ICD-10-CM

## 2020-10-02 DIAGNOSIS — E782 Mixed hyperlipidemia: Secondary | ICD-10-CM | POA: Diagnosis not present

## 2020-10-02 DIAGNOSIS — L0390 Cellulitis, unspecified: Secondary | ICD-10-CM | POA: Diagnosis not present

## 2020-10-03 DIAGNOSIS — L298 Other pruritus: Secondary | ICD-10-CM | POA: Diagnosis not present

## 2020-10-03 DIAGNOSIS — Z974 Presence of external hearing-aid: Secondary | ICD-10-CM | POA: Diagnosis not present

## 2020-10-03 DIAGNOSIS — H9193 Unspecified hearing loss, bilateral: Secondary | ICD-10-CM | POA: Diagnosis not present

## 2020-10-03 DIAGNOSIS — R0981 Nasal congestion: Secondary | ICD-10-CM | POA: Diagnosis not present

## 2020-10-03 DIAGNOSIS — J342 Deviated nasal septum: Secondary | ICD-10-CM | POA: Diagnosis not present

## 2020-10-03 LAB — COMPREHENSIVE METABOLIC PANEL
ALT: 34 IU/L (ref 0–44)
AST: 34 IU/L (ref 0–40)
Albumin/Globulin Ratio: 1.5 (ref 1.2–2.2)
Albumin: 4.2 g/dL (ref 3.7–4.7)
Alkaline Phosphatase: 101 IU/L (ref 44–121)
BUN/Creatinine Ratio: 19 (ref 10–24)
BUN: 15 mg/dL (ref 8–27)
Bilirubin Total: 0.7 mg/dL (ref 0.0–1.2)
CO2: 22 mmol/L (ref 20–29)
Calcium: 9.5 mg/dL (ref 8.6–10.2)
Chloride: 104 mmol/L (ref 96–106)
Creatinine, Ser: 0.78 mg/dL (ref 0.76–1.27)
GFR calc Af Amer: 101 mL/min/{1.73_m2} (ref >59)
GFR calc non Af Amer: 87 mL/min/{1.73_m2} (ref >59)
Globulin, Total: 2.8 g/dL (ref 1.5–4.5)
Glucose: 89 mg/dL (ref 65–99)
Potassium: 4 mmol/L (ref 3.5–5.2)
Sodium: 139 mmol/L (ref 134–144)
Total Protein: 7 g/dL (ref 6.0–8.5)

## 2020-10-03 LAB — CBC WITH DIFFERENTIAL/PLATELET
Basophils Absolute: 0.1 10*3/uL (ref 0.0–0.2)
Basos: 1 %
EOS (ABSOLUTE): 0.2 10*3/uL (ref 0.0–0.4)
Eos: 2 %
Hematocrit: 44.4 % (ref 37.5–51.0)
Hemoglobin: 14.4 g/dL (ref 13.0–17.7)
Immature Grans (Abs): 0 10*3/uL (ref 0.0–0.1)
Immature Granulocytes: 0 %
Lymphocytes Absolute: 1.4 10*3/uL (ref 0.7–3.1)
Lymphs: 15 %
MCH: 31 pg (ref 26.6–33.0)
MCHC: 32.4 g/dL (ref 31.5–35.7)
MCV: 96 fL (ref 79–97)
Monocytes Absolute: 1 10*3/uL — ABNORMAL HIGH (ref 0.1–0.9)
Monocytes: 10 %
Neutrophils Absolute: 6.7 10*3/uL (ref 1.4–7.0)
Neutrophils: 72 %
Platelets: 227 10*3/uL (ref 150–450)
RBC: 4.65 x10E6/uL (ref 4.14–5.80)
RDW: 14.3 % (ref 11.6–15.4)
WBC: 9.4 10*3/uL (ref 3.4–10.8)

## 2020-10-03 LAB — LIPID PANEL
Chol/HDL Ratio: 5.4 ratio — ABNORMAL HIGH (ref 0.0–5.0)
Cholesterol, Total: 193 mg/dL (ref 100–199)
HDL: 36 mg/dL — ABNORMAL LOW (ref >39)
LDL Chol Calc (NIH): 107 mg/dL — ABNORMAL HIGH (ref 0–99)
Triglycerides: 293 mg/dL — ABNORMAL HIGH (ref 0–149)
VLDL Cholesterol Cal: 50 mg/dL — ABNORMAL HIGH (ref 5–40)

## 2020-10-03 LAB — CARDIOVASCULAR RISK ASSESSMENT

## 2020-10-03 LAB — HEMOGLOBIN A1C
Est. average glucose Bld gHb Est-mCnc: 117 mg/dL
Hgb A1c MFr Bld: 5.7 % — ABNORMAL HIGH (ref 4.8–5.6)

## 2020-10-04 ENCOUNTER — Ambulatory Visit (INDEPENDENT_AMBULATORY_CARE_PROVIDER_SITE_OTHER): Payer: Medicare Other | Admitting: Family Medicine

## 2020-10-04 ENCOUNTER — Other Ambulatory Visit: Payer: Self-pay

## 2020-10-04 ENCOUNTER — Encounter: Payer: Self-pay | Admitting: Family Medicine

## 2020-10-04 VITALS — BP 110/60 | HR 64 | Temp 95.0°F | Ht 69.0 in | Wt 194.0 lb

## 2020-10-04 DIAGNOSIS — E782 Mixed hyperlipidemia: Secondary | ICD-10-CM | POA: Diagnosis not present

## 2020-10-04 DIAGNOSIS — G4733 Obstructive sleep apnea (adult) (pediatric): Secondary | ICD-10-CM

## 2020-10-04 DIAGNOSIS — I10 Essential (primary) hypertension: Secondary | ICD-10-CM

## 2020-10-04 DIAGNOSIS — J410 Simple chronic bronchitis: Secondary | ICD-10-CM | POA: Diagnosis not present

## 2020-10-04 DIAGNOSIS — R7302 Impaired glucose tolerance (oral): Secondary | ICD-10-CM

## 2020-10-04 DIAGNOSIS — J9611 Chronic respiratory failure with hypoxia: Secondary | ICD-10-CM | POA: Diagnosis not present

## 2020-10-04 DIAGNOSIS — Z23 Encounter for immunization: Secondary | ICD-10-CM | POA: Diagnosis not present

## 2020-10-04 DIAGNOSIS — G720 Drug-induced myopathy: Secondary | ICD-10-CM | POA: Diagnosis not present

## 2020-10-04 DIAGNOSIS — Z9989 Dependence on other enabling machines and devices: Secondary | ICD-10-CM | POA: Diagnosis not present

## 2020-10-04 MED ORDER — ICOSAPENT ETHYL 1 G PO CAPS: 2.0000 g | ORAL_CAPSULE | Freq: Two times a day (BID) | ORAL | 2 refills | Status: DC

## 2020-10-04 NOTE — Patient Instructions (Signed)
Hold cholestoff. Hold pravastatin. Change otc fish oil to prescription  Fish oil.

## 2020-10-04 NOTE — Progress Notes (Signed)
Subjective:  Patient ID: William Clay, male    DOB: August 05, 1941  Age: 79 y.o. MRN: [id number]  Chief Complaint  Patient presents with  . Hyperlipidemia  . Hypertension  . Diabetes    HPI Patient is a 79 year old white male with history of hyperlipidemia, hypertension, GERD, COPD, and prediabetes.  Patient is eating healthy.  He is walking daily (recent surgery.)    For his COPD he is currently on Stiolto, Singulair, and DuoNeb treatments he also uses 2 L of oxygen at night in addition to his cpap. Wears CPAP all night. He clearly feel he benefits from it.   The patient's hypertension is well controlled on amlodipine and losartan.   Patient is also on Zoloft for atypical depression.  This works well.  Denies irritability.  Denies depression.  GERD: Patient is currently taking omeprazole 40 mg once daily which is doing well.  Patient has hyperlipidemia for which he takes OTC fish oil and cholestoff. He is complaining of muscle cramps and spasms.  Patient is having back pain (S/P back surgery) some, but better. His legs burn and ache by the end of the day. He is also complaining of neck pain on the left side. Denies numbness in his arms.  Patient is currently on Lyrica 50 mg twice daily.   Social History   Socioeconomic History  . Marital status: Married    Spouse name: Not on file  . Number of children: 2  . Years of education: Not on file  . Highest education level: Not on file  Occupational History  . Occupation: retired    Comment: Event organiser  Tobacco Use  . Smoking status: Former Smoker    Packs/day: 2.00    Years: 50.00    Pack years: 100.00    Types: Cigarettes    Quit date: 12/24/2007    Years since quitting: 12.8  . Smokeless tobacco: Never Used  Vaping Use  . Vaping Use: Never used  Substance and Sexual Activity  . Alcohol use: No    Alcohol/week: 0.0 standard drinks  . Drug use: No  . Sexual activity: Not on file  Other Topics Concern  . Not on file   Social History Narrative  . Not on file   Social Determinants of Health   Financial Resource Strain:   . Difficulty of Paying Living Expenses: Not on file  Food Insecurity: No Food Insecurity  . Worried About Charity fundraiser in the Last Year: Never true  . Ran Out of Food in the Last Year: Never true  Transportation Needs:   . Lack of Transportation (Medical): Not on file  . Lack of Transportation (Non-Medical): Not on file  Physical Activity:   . Days of Exercise per Week: Not on file  . Minutes of Exercise per Session: Not on file  Stress:   . Feeling of Stress : Not on file  Social Connections:   . Frequency of Communication with Friends and Family: Not on file  . Frequency of Social Gatherings with Friends and Family: Not on file  . Attends Religious Services: Not on file  . Active Member of Clubs or Organizations: Not on file  . Attends Archivist Meetings: Not on file  . Marital Status: Not on file   Past Medical History:  Diagnosis Date  . Atherosclerosis of aorta ([place])   . Bowel obstruction ([place]) 2018  . BPH (benign prostatic hyperplasia)   . COPD (chronic obstructive pulmonary disease) ([place])   .  COPD (chronic obstructive pulmonary disease) ([place])   . GERD (gastroesophageal reflux disease)   . Hypertension   . Hypoxemia   . Mixed hyperlipidemia   . Osteoarthritis   . Pneumonia 0263,7858  . Primary insomnia   . Sleep apnea   . Spontaneous ecchymoses    Family History  Problem Relation Age of Onset  . Heart failure Mother   . Diabetes Mother   . CAD Mother   . Hypertension Mother     Review of Systems  Constitutional: Negative for chills, fatigue and fever.  HENT: Negative for congestion, ear pain and sore throat.   Respiratory: Negative for cough and shortness of breath.   Cardiovascular: Negative for chest pain.  Gastrointestinal: Negative for abdominal pain, constipation, diarrhea, nausea and vomiting.  Endocrine: Negative for  polydipsia, polyphagia and polyuria.  Genitourinary: Negative for dysuria and frequency.  Musculoskeletal: Positive for arthralgias, back pain and myalgias.  Neurological: Negative for dizziness and headaches.  Psychiatric/Behavioral: Negative for dysphoric mood. The patient is not nervous/anxious.        No anhedonia     Objective:  BP 110/60   Pulse 64   Temp (!) 95 F (35 C)   Ht 5\' 9"  (1.753 m)   Wt 194 lb (88 kg)   SpO2 96%   BMI 28.65 kg/m   BP/Weight 10/04/2020 09/12/2020 8/50/2774  Systolic BP 128 786 767  Diastolic BP 60 70 75  Wt. (Lbs) 194 192 -  BMI 28.65 28.35 -    Physical Exam Vitals reviewed.  Constitutional:      Appearance: Normal appearance.  Neck:     Vascular: No carotid bruit.  Cardiovascular:     Rate and Rhythm: Normal rate and regular rhythm.     Pulses: Normal pulses.     Heart sounds: Normal heart sounds.  Pulmonary:     Effort: Pulmonary effort is normal.     Breath sounds: Normal breath sounds. No wheezing, rhonchi or rales.  Abdominal:     General: Bowel sounds are normal.     Palpations: Abdomen is soft.     Tenderness: There is no abdominal tenderness.  Musculoskeletal:     Comments: Left side of neck - tender. Spasm. Wearing lumbar back brace.    Neurological:     Mental Status: He is alert.  Psychiatric:        Mood and Affect: Mood normal.        Behavior: Behavior normal.     Lab Results  Component Value Date   WBC 9.4 10/02/2020   HGB 14.4 10/02/2020   HCT 44.4 10/02/2020   PLT 227 10/02/2020   GLUCOSE 89 10/02/2020   CHOL 193 10/02/2020   TRIG 293 (H) 10/02/2020   HDL 36 (L) 10/02/2020   LDLCALC 107 (H) 10/02/2020   ALT 34 10/02/2020   AST 34 10/02/2020   NA 139 10/02/2020   K 4.0 10/02/2020   CL 104 10/02/2020   CREATININE 0.78 10/02/2020   BUN 15 10/02/2020   CO2 22 10/02/2020   HGBA1C 5.7 (H) 10/02/2020      Assessment & Plan:  1. Mixed hyperlipidemia Poorly controlled.  Hold cholestoff. Hold  pravastatin. Change otc fish oil to prescription  Fish oil (Vascepa to 2 g twice daily.) Continue to work on eating a healthy diet and exercise.  Labs reviewed.  2. Essential hypertension, benign Well controlled.  No changes to medicines.  Continue to work on eating a healthy diet and exercise.  Labs  reviewed.  3. Simple chronic bronchitis (HCC) The current medical regimen is effective;  continue present plan and medications.  4. OSA on CPAP Continue CPAP.  Patient is compliant and benefits from CPAP.  5. Drug-induced myopathy Intolerant to statins. - Magnesium - Phosphorus - CK  6. Need for vaccination - Pneumococcal polysaccharide vaccine 23-valent greater than or equal to 2yo subcutaneous/IM  7. Chronic respiratory failure with hypoxia (HCC) Continue oxygen at night at 2 L.  8. Impaired glucose tolerance A1c stable. Continue eating healthy and exercising as able.  Meds ordered this encounter  Medications  . cetirizine (ZYRTEC) 10 MG tablet    Sig: Take 1 tablet (10 mg total) by mouth daily.    Dispense:  90 tablet    Refill:  3  . sertraline (ZOLOFT) 100 MG tablet    Sig: Take 1 tablet (100 mg total) by mouth daily.    Dispense:  90 tablet    Refill:  3   Follow-up: Return in about 3 months (around 01/04/2021).  AVS was given to patient prior to departure.  [name] [facility] [phone]

## 2020-10-10 ENCOUNTER — Telehealth: Payer: Self-pay

## 2020-10-10 DIAGNOSIS — E782 Mixed hyperlipidemia: Secondary | ICD-10-CM

## 2020-10-10 NOTE — Chronic Care Management (AMB) (Signed)
Reviewed patient's medication formulary. Vascepa is tier 4 with [employer] for 2021. Vascepa is not available through patient assistance. Tier exception may be considered if tries and fails other options in lower tiers. Generic Lovaza is not listed on formulary but is a cheaper alternative.   Lipid Panel     Component Value Date/Time   CHOL 193 10/02/2020 1451   TRIG 293 (H) 10/02/2020 1451   HDL 36 (L) 10/02/2020 1451   CHOLHDL 5.4 (H) 10/02/2020 1451   LDLCALC 107 (H) 10/02/2020 1451   LABVLDL 50 (H) 10/02/2020 [place] William Clay, PharmD, [facility] Clinical Pharmacist [facility] [phone] (office) [phone] (mobile)

## 2020-10-14 ENCOUNTER — Other Ambulatory Visit: Payer: Self-pay | Admitting: Family Medicine

## 2020-10-14 MED ORDER — OMEGA-3-ACID ETHYL ESTERS 1 G PO CAPS
2.0000 g | ORAL_CAPSULE | Freq: Two times a day (BID) | ORAL | 2 refills | Status: DC
Start: 2020-10-14 — End: 2020-10-16

## 2020-10-15 ENCOUNTER — Encounter: Payer: Self-pay | Admitting: Family Medicine

## 2020-10-15 DIAGNOSIS — G720 Drug-induced myopathy: Secondary | ICD-10-CM | POA: Insufficient documentation

## 2020-10-15 DIAGNOSIS — R7302 Impaired glucose tolerance (oral): Secondary | ICD-10-CM | POA: Insufficient documentation

## 2020-10-16 ENCOUNTER — Other Ambulatory Visit: Payer: Self-pay | Admitting: Family Medicine

## 2020-10-16 MED ORDER — OMEGA-3-ACID ETHYL ESTERS 1 G PO CAPS
2.0000 g | ORAL_CAPSULE | Freq: Two times a day (BID) | ORAL | 2 refills | Status: DC
Start: 2020-10-16 — End: 2021-01-05

## 2020-10-19 DIAGNOSIS — M4316 Spondylolisthesis, lumbar region: Secondary | ICD-10-CM | POA: Diagnosis not present

## 2020-10-19 DIAGNOSIS — M961 Postlaminectomy syndrome, not elsewhere classified: Secondary | ICD-10-CM | POA: Diagnosis not present

## 2020-10-19 DIAGNOSIS — M4326 Fusion of spine, lumbar region: Secondary | ICD-10-CM | POA: Diagnosis not present

## 2020-10-19 DIAGNOSIS — M4726 Other spondylosis with radiculopathy, lumbar region: Secondary | ICD-10-CM | POA: Diagnosis not present

## 2020-10-23 DIAGNOSIS — L578 Other skin changes due to chronic exposure to nonionizing radiation: Secondary | ICD-10-CM | POA: Diagnosis not present

## 2020-10-23 DIAGNOSIS — L0390 Cellulitis, unspecified: Secondary | ICD-10-CM | POA: Diagnosis not present

## 2020-10-26 DIAGNOSIS — M545 Low back pain, unspecified: Secondary | ICD-10-CM | POA: Diagnosis not present

## 2020-11-01 ENCOUNTER — Telehealth: Payer: Medicare Other

## 2020-11-01 NOTE — Chronic Care Management (AMB) (Deleted)
Chronic Care Management Pharmacy  Name: William Clay  MRN: [id number] DOB: 1941-07-19  Chief Complaint/ HPI  [name],  79 y.o. , male presents for their Follow-Up CCM visit with the clinical pharmacist via telephone due to COVID-19 Pandemic.  PCP : [name], MD  Their chronic conditions include: HTN, CAD, COPD, HLD, Depression.   Office Visits: 10/04/2020 - hold cholestoff. Hold pravastatin. Change OTC fish oil to prescription.  09/12/2020 -  Referral to dermatology. Complete antibiotics for cellulitis. Flu shot given.  Consult Visit: 04/12/2020 - Admitted to inpatient rehab after revision of l3-l5 laminectomy.  04/10/2020 - Revision of l3-l5 procedure at [facility].   Medications: Outpatient Encounter Medications as of 11/01/2020  Medication Sig  . albuterol (PROVENTIL HFA;VENTOLIN HFA) 108 (90 Base) MCG/ACT inhaler Inhale two puffs every four to six hours as needed for cough or wheeze.  Marland Kitchen amLODipine (NORVASC) 10 MG tablet TAKE 1 TABLET BY MOUTH ONCE DAILY  . cetirizine (ZYRTEC) 10 MG tablet Take 1 tablet (10 mg total) by mouth daily.  Marland Kitchen losartan (COZAAR) 100 MG tablet Take 1 tablet (100 mg total) by mouth daily.  . Multiple Vitamins-Minerals (MULTIVITAMIN WITH MINERALS) tablet Take 1 tablet by mouth daily.  Marland Kitchen omega-3 acid ethyl esters (LOVAZA) 1 g capsule Take 2 capsules (2 g total) by mouth 2 (two) times daily.  Marland Kitchen omeprazole (PRILOSEC) 40 MG capsule Take 1 capsule (40 mg total) by mouth daily.  . pravastatin (PRAVACHOL) 20 MG tablet TAKE 1 TABLET(20 MG) BY MOUTH DAILY  . pregabalin (LYRICA) 50 MG capsule Take 100 mg by mouth 2 (two) times daily.   . Probiotic Product (RESTORA PO) Take 1 tablet by mouth daily.  . sertraline (ZOLOFT) 100 MG tablet Take 1 tablet (100 mg total) by mouth daily.  . Tiotropium Bromide-Olodaterol (STIOLTO RESPIMAT) 2.5-2.5 MCG/ACT AERS Inhale 2 puffs into the lungs daily.    No facility-administered encounter medications on file as of  11/01/2020.   Allergies  Allergen Reactions  . Cefadroxil Shortness Of Breath  . Lisinopril Other (See Comments)    Dyspnea Dyspnea   . Simvastatin Other (See Comments)    Muscle Cramps Muscle Cramps   . Amoxicillin     Mouth sores   . Gabapentin   . Metoprolol Other (See Comments)    Leg cramps  . Potassium Clavulanate [Clavulanic Acid]     diarrhea    SDOH Screenings   Alcohol Screen:   . Last Alcohol Screening Score (AUDIT): Not on file  Depression (PHQ2-9): Low Risk   . PHQ-2 Score: 0  Financial Resource Strain:   . Difficulty of Paying Living Expenses: Not on file  Food Insecurity: No Food Insecurity  . Worried About Charity fundraiser in the Last Year: Never true  . Ran Out of Food in the Last Year: Never true  Housing: Low Risk   . Last Housing Risk Score: 0  Physical Activity:   . Days of Exercise per Week: Not on file  . Minutes of Exercise per Session: Not on file  Social Connections:   . Frequency of Communication with Friends and Family: Not on file  . Frequency of Social Gatherings with Friends and Family: Not on file  . Attends Religious Services: Not on file  . Active Member of Clubs or Organizations: Not on file  . Attends Archivist Meetings: Not on file  . Marital Status: Not on file  Stress:   . Feeling of Stress : Not on  file  Tobacco Use: Medium Risk  . Smoking Tobacco Use: Former Smoker  . Smokeless Tobacco Use: Never Used  Transportation Needs:   . Film/video editor (Medical): Not on file  . Lack of Transportation (Non-Medical): Not on file     Current Diagnosis/Assessment:  Goals Addressed   None     COPD / Asthma / Tobacco   Last spirometry score: 2.46 @ 87%                           Eos (Absolute):  Lab Results  Component Value Date/Time   EOSABS 0.2 10/02/2020 02:51 PM    Tobacco Status:  Social History   Tobacco Use  Smoking Status Former Smoker  . Packs/day: 2.00  . Years: 50.00  . Pack years:  100.00  . Types: Cigarettes  . Quit date: 12/24/2007  . Years since quitting: 12.8  Smokeless Tobacco Never Used    Patient has failed these meds in past: asmanex, breo ellipta, singulair, duoneb Patient is currently controlled on the following medications: albuterol prn, Stiolto respimat, Incruse Ellipta, oxygen Using maintenance inhaler regularly? Yes Frequency of rescue inhaler use:  daily  We discussed:  proper inhaler technique. Patient reports that is well managed currently. He is on Oxygen. He receives his Stiolto from the [facility] due to cost at [place].   Update 08/22/2020 - Patient has been able to resume walking to mailbox post-surgery. The heat has limited some of his activities.   Plan  Continue current medications ,  Hypertension   BP today is:  <140/90  Lab Results  Component Value Date   CREATININE 0.78 10/02/2020   CREATININE 0.95 05/24/2020   CREATININE 0.95 05/04/2020   Office blood pressures are  BP Readings from Last 3 Encounters:  10/04/20 110/60  09/12/20 110/70  06/12/20 107/75    Patient has failed these meds in the past: valsartan, lisinopril Patient is currently controlled on the following medications: amlodipine 10 mg daily, losartan 100 mg daily Patient checks BP at home weekly  Patient home BP readings are ranging: 120/80 mmHg  We discussed diet and exercise extensively   Update 08/22/2020 - Patient is eating a healthy diet this summer of lots of garden vegetables. Reports good bp control at home.   Plan  Continue current medications   Hyperlipidemia/CAD   Lipid Panel     Component Value Date/Time   CHOL 193 10/02/2020 1451   TRIG 293 (H) 10/02/2020 1451   HDL 36 (L) 10/02/2020 1451   CHOLHDL 5.4 (H) 10/02/2020 1451   LDLCALC 107 (H) 10/02/2020 1451   LABVLDL 50 (H) 10/02/2020 1451    CMP Latest Ref Rng & Units 10/02/2020 05/24/2020 05/04/2020  Glucose 65 - 99 mg/dL 89 93 110(H)  BUN 8 - 27 mg/dL 15 14 16    Creatinine 0.76 - 1.27 mg/dL 0.78 0.95 0.95  Sodium 134 - 144 mmol/L 139 140 142  Potassium 3.5 - 5.2 mmol/L 4.0 3.8 4.1  Chloride 96 - 106 mmol/L 104 104 104  CO2 20 - 29 mmol/L 22 22 21   Calcium 8.6 - 10.2 mg/dL 9.5 9.7 10.0  Total Protein 6.0 - 8.5 g/dL 7.0 6.9 6.8  Total Bilirubin 0.0 - 1.2 mg/dL 0.7 0.6 0.7  Alkaline Phos 44 - 121 IU/L 101 108 90  AST 0 - 40 IU/L 34 20 25  ALT 0 - 44 IU/L 34 18 16    The 10-year ASCVD risk score (  Renaye Rakers., et al., 2013) is: 27.3%   Values used to calculate the score:     Age: 15 years     Sex: Male     Is Non-Hispanic African American: No     Diabetic: No     Tobacco smoker: No     Systolic Blood Pressure: 417 mmHg     Is BP treated: Yes     HDL Cholesterol: 36 mg/dL     Total Cholesterol: 193 mg/dL   Patient has failed these meds in past: simvastatin Patient is currently uncontrolled on the following medications: omega 3 1000 mg bid, pravastatin 20 mg daily.  We discussed:  diet and exercise extensively. His diet varies from day to day.  Eats cereal with a banana in it with coffee for breakfast. May eat a sandwich for lunch. Chicken and vegetables for suppers. He has cut out most bread except for an occasional sandwich or breakfast. Discussed elevated triglycerides and being mindful with diet.   Update 08/22/2020 - Patient reports healthy diet of garden vegetables.   Plan  Continue current medications    and   Lumbar Stenosis   Patient has failed these meds in past: tramadol, lyrica, gabapentin, meloxicam Patient is currently controlled on the following medications: pregabalin 50 mg bid  We discussed:  Patient is recovering from a recent surgery. He is doing physical therapy at home. He is off of meloxicam right now but hopes to resume taking in 2 more months.   Update 08/22/2020 - Patient has been able to resume walking to his mailbox. He was unable to plant a garden this summer due to recovery from back surgery but has  neighbors and family who have been sharing.   Plan  Continue current medications   GERD   Patient has failed these meds in past: famotidine 40 mg, ranitidine 300 mg  Patient is currently controlled on the following medications: omeprazole 40 mg daily  We discussed:  Patient states that he is taking bid. Bottle states daily. He was previously using omeprazole 20 mg bid. We discussed and patient is going to try to just take in the am and see how symptoms respond. He will let pharmacist know if needs to be increased to twice daily.   Update 08/22/2020 - Patient reports a few instances of diarrhea lately. He is uncertain origin but plans to purchase and take a dose of imodium. Pharmacist encouraged patient to contact provider if continues. Patient feels that it is coming from all of the garden vegetables he is eating.   Plan  Continue current medications   Health Maintenance   Patient is currently controlled on the following medications:  Cetirizine 10 mg daily - allergies Multivitamin daily - general health Probiotic - general health We discussed:  Patient reports that he is well managed and recovering well.    Plan  Continue current medications  Vaccines   Reviewed and discussed patient's vaccination history.    Immunization History  Administered Date(s) Administered  . Fluad Quad(high Dose 65+) 09/12/2020  . Influenza Split 08/24/2015  . Moderna SARS-COVID-2 Vaccination 02/21/2020, 03/12/2020  . Pneumococcal Conjugate-13 10/23/2014  . Pneumococcal Polysaccharide-23 10/04/2020    Plan  Recommended patient receive annual flu vaccine in office.  Medication Management   Pt uses [facility] for all medications Uses pill box? Yes - stores medication in a tote bag from [facility]. He uses a weekly pill organizer as well.  Pt endorses excellent compliance  We discussed:  He rarely misses doses of medication.   Plan  Continue current medication management  strategy    Follow up: 3 month phone visit

## 2020-11-06 ENCOUNTER — Ambulatory Visit (INDEPENDENT_AMBULATORY_CARE_PROVIDER_SITE_OTHER): Payer: Medicare Other | Admitting: Family Medicine

## 2020-11-06 VITALS — BP 105/55 | HR 112 | Temp 98.6°F

## 2020-11-06 DIAGNOSIS — N3001 Acute cystitis with hematuria: Secondary | ICD-10-CM

## 2020-11-06 DIAGNOSIS — R2689 Other abnormalities of gait and mobility: Secondary | ICD-10-CM

## 2020-11-06 DIAGNOSIS — R509 Fever, unspecified: Secondary | ICD-10-CM

## 2020-11-06 LAB — POCT URINALYSIS DIPSTICK
Glucose, UA: NEGATIVE
Ketones, UA: POSITIVE
Nitrite, UA: NEGATIVE
Protein, UA: POSITIVE — AB
Spec Grav, UA: 1.025 (ref 1.010–1.025)
Urobilinogen, UA: 1 E.U./dL
pH, UA: 5.5 (ref 5.0–8.0)

## 2020-11-06 MED ORDER — CIPROFLOXACIN HCL 500 MG PO TABS: 500.0000 mg | ORAL_TABLET | Freq: Two times a day (BID) | ORAL | 0 refills | Status: DC

## 2020-11-06 NOTE — Progress Notes (Signed)
Acute Office Visit  Subjective:    Patient ID: William Clay, male    DOB: 19-Nov-1941, 79 y.o.   MRN: [id number]  Chief Complaint  Patient presents with  . Urinary Tract Infection  . Cough    HPI Patient is in today for low grade fever, trembling, shaking, runny nose, dizzy since yesterday. Also coughing, sob, headache.  Patient was seen by [facility] doctor put him back on oxygen at 2L at night with cpap and 3 L, while on portable oxygen during the day. Denies chest pain.  Increased urinary frequency and small volume urine, dysuria.  No increased confusion. Has had covid 19 moderna 1 and 2.  BP 131/73 and pulse was 109 at home. Pulse ox: 91 on 2 L oxygen at home. Pt had POCT covid 19 and influenza A and B tests. All negative. .   Past Medical History:  Diagnosis Date  . Atherosclerosis of aorta ([place])   . Bowel obstruction ([place]) 2018  . BPH (benign prostatic hyperplasia)   . COPD (chronic obstructive pulmonary disease) ([place])   . COPD (chronic obstructive pulmonary disease) ([place])   . GERD (gastroesophageal reflux disease)   . Hypertension   . Hypoxemia   . Lumbar stenosis with neurogenic claudication 04/24/2017  . Mixed hyperlipidemia   . Osteoarthritis   . Pneumonia [id number]  . Primary insomnia   . Sleep apnea   . Spontaneous ecchymoses     Past Surgical History:  Procedure Laterality Date  . ABDOMINAL AORTIC ANEURYSM REPAIR    . BACK SURGERY N/A [phone], 2021   Lower back and cervical spine  . BACK SURGERY  1984   upper back  . CATARACT EXTRACTION Left 2017  . CATARACT EXTRACTION Right 2017  . CHOLECYSTECTOMY  2018  . HERNIA REPAIR  2018  . PROSTATECTOMY    . ROTATOR CUFF REPAIR Right 2015  . ROTATOR CUFF REPAIR Left 2017    Family History  Problem Relation Age of Onset  . Heart failure Mother   . Diabetes Mother   . CAD Mother   . Hypertension Mother     Social History   Socioeconomic History  . Marital status: Married     Spouse name: Not on file  . Number of children: 2  . Years of education: Not on file  . Highest education level: Not on file  Occupational History  . Occupation: retired    Comment: Event organiser  Tobacco Use  . Smoking status: Former Smoker    Packs/day: 2.00    Years: 50.00    Pack years: 100.00    Types: Cigarettes    Quit date: 12/24/2007    Years since quitting: 12.8  . Smokeless tobacco: Never Used  Vaping Use  . Vaping Use: Never used  Substance and Sexual Activity  . Alcohol use: No    Alcohol/week: 0.0 standard drinks  . Drug use: No  . Sexual activity: Not on file  Other Topics Concern  . Not on file  Social History Narrative  . Not on file   Social Determinants of Health   Financial Resource Strain:   . Difficulty of Paying Living Expenses: Not on file  Food Insecurity: No Food Insecurity  . Worried About Charity fundraiser in the Last Year: Never true  . Ran Out of Food in the Last Year: Never true  Transportation Needs:   . Lack of Transportation (Medical): Not on file  . Lack of Transportation (Non-Medical): Not on file  Physical Activity:   . Days of Exercise per Week: Not on file  . Minutes of Exercise per Session: Not on file  Stress:   . Feeling of Stress : Not on file  Social Connections:   . Frequency of Communication with Friends and Family: Not on file  . Frequency of Social Gatherings with Friends and Family: Not on file  . Attends Religious Services: Not on file  . Active Member of Clubs or Organizations: Not on file  . Attends Archivist Meetings: Not on file  . Marital Status: Not on file  Intimate Partner Violence:   . Fear of Current or Ex-Partner: Not on file  . Emotionally Abused: Not on file  . Physically Abused: Not on file  . Sexually Abused: Not on file    Outpatient Medications Prior to Visit  Medication Sig Dispense Refill  . albuterol (PROVENTIL HFA;VENTOLIN HFA) 108 (90 Base) MCG/ACT inhaler Inhale two puffs  every four to six hours as needed for cough or wheeze. 1 Inhaler 1  . amLODipine (NORVASC) 10 MG tablet TAKE 1 TABLET BY MOUTH ONCE DAILY 90 tablet 1  . cetirizine (ZYRTEC) 10 MG tablet Take 1 tablet (10 mg total) by mouth daily. 90 tablet 3  . losartan (COZAAR) 100 MG tablet Take 1 tablet (100 mg total) by mouth daily. 90 tablet 1  . Multiple Vitamins-Minerals (MULTIVITAMIN WITH MINERALS) tablet Take 1 tablet by mouth daily.    Marland Kitchen omega-3 acid ethyl esters (LOVAZA) 1 g capsule Take 2 capsules (2 g total) by mouth 2 (two) times daily. 120 capsule 2  . omeprazole (PRILOSEC) 40 MG capsule Take 1 capsule (40 mg total) by mouth daily. 90 capsule 1  . pravastatin (PRAVACHOL) 20 MG tablet TAKE 1 TABLET(20 MG) BY MOUTH DAILY 90 tablet 1  . pregabalin (LYRICA) 50 MG capsule Take 100 mg by mouth 2 (two) times daily.     . Probiotic Product (RESTORA PO) Take 1 tablet by mouth daily.    . sertraline (ZOLOFT) 100 MG tablet Take 1 tablet (100 mg total) by mouth daily. 90 tablet 3  . Tiotropium Bromide-Olodaterol (STIOLTO RESPIMAT) 2.5-2.5 MCG/ACT AERS Inhale 2 puffs into the lungs daily.      No facility-administered medications prior to visit.    Allergies  Allergen Reactions  . Cefadroxil Shortness Of Breath  . Lisinopril Other (See Comments)    Dyspnea Dyspnea   . Simvastatin Other (See Comments)    Muscle Cramps Muscle Cramps   . Amoxicillin     Mouth sores   . Gabapentin   . Metoprolol Other (See Comments)    Leg cramps  . Potassium Clavulanate [Clavulanic Acid]     diarrhea    Review of Systems  Constitutional: Positive for chills, fatigue and fever.  HENT: Positive for rhinorrhea. Negative for congestion, ear pain and sore throat.   Respiratory: Positive for cough and shortness of breath.   Cardiovascular: Negative for chest pain.  Gastrointestinal: Negative for abdominal pain, constipation, nausea and vomiting.  Genitourinary: Positive for difficulty urinating, dysuria and  frequency.  Musculoskeletal: Positive for myalgias.  Neurological: Positive for headaches.  Psychiatric/Behavioral: Positive for confusion.       Objective:    Physical Exam Constitutional:      Comments: In wheelchair due to imbalance.   HENT:     Mouth/Throat:     Mouth: Mucous membranes are moist.     Pharynx: No oropharyngeal exudate or posterior oropharyngeal erythema.  Cardiovascular:  Rate and Rhythm: Normal rate and regular rhythm.     Heart sounds: Normal heart sounds.  Pulmonary:     Effort: Pulmonary effort is normal. No respiratory distress.     Breath sounds: Normal breath sounds. No wheezing, rhonchi or rales.  Abdominal:     General: Bowel sounds are normal.     Palpations: Abdomen is soft.     Tenderness: There is no abdominal tenderness.  Neurological:     Mental Status: He is alert.  Psychiatric:        Mood and Affect: Mood normal.        Behavior: Behavior normal.     BP (!) 105/55   Pulse (!) 112   Temp 98.6 F (37 C)   SpO2 (!) 89%  Wt Readings from Last 3 Encounters:  10/04/20 194 lb (88 kg)  09/12/20 192 lb (87.1 kg)  05/26/20 197 lb (89.4 kg)    Health Maintenance Due  Topic Date Due  . Hepatitis C Screening  Never done  . TETANUS/TDAP  Never done    There are no preventive care reminders to display for this patient.   No results found for: TSH Lab Results  Component Value Date   WBC 9.4 10/02/2020   HGB 14.4 10/02/2020   HCT 44.4 10/02/2020   MCV 96 10/02/2020   PLT 227 10/02/2020   Lab Results  Component Value Date   NA 139 10/02/2020   K 4.0 10/02/2020   CO2 22 10/02/2020   GLUCOSE 89 10/02/2020   BUN 15 10/02/2020   CREATININE 0.78 10/02/2020   BILITOT 0.7 10/02/2020   ALKPHOS 101 10/02/2020   AST 34 10/02/2020   ALT 34 10/02/2020   PROT 7.0 10/02/2020   ALBUMIN 4.2 10/02/2020   CALCIUM 9.5 10/02/2020   Lab Results  Component Value Date   CHOL 193 10/02/2020   Lab Results  Component Value Date   HDL  36 (L) 10/02/2020   Lab Results  Component Value Date   LDLCALC 107 (H) 10/02/2020   Lab Results  Component Value Date   TRIG 293 (H) 10/02/2020   Lab Results  Component Value Date   CHOLHDL 5.4 (H) 10/02/2020   Lab Results  Component Value Date   HGBA1C 5.7 (H) 10/02/2020       Assessment & Plan:  1. Acute cystitis with hematuria I hoped to give rocephin, but pt has an allergy to cefodroxil. Start on cipro. Monitor closely and if symptoms worsen, pt to go to ED.  Minimize movement for the next 24 hours due to imbalance until antibiotics start to work.  - ciprofloxacin (CIPRO) 500 MG tablet; Take 1 tablet (500 mg total) by mouth 2 (two) times daily.  Dispense: 14 tablet; Refill: 0 - POCT urinalysis dipstick - Urine Culture  2. Fever, unspecified fever cause - POC COVID-19 BinaxNow negative - Novel Coronavirus, NAA ([facility]) sent - Influenza A/B negative  3. Imbalance  Concerning as likely due to infection.  Meds ordered this encounter  Medications  . ciprofloxacin (CIPRO) 500 MG tablet    Sig: Take 1 tablet (500 mg total) by mouth 2 (two) times daily.    Dispense:  14 tablet    Refill:  0    Orders Placed This Encounter  Procedures  . Urine Culture  . Novel Coronavirus, NAA ([facility])  . POCT urinalysis dipstick  . POC COVID-19 BinaxNow  . Influenza A/B     I spent 30 minutes dedicated to the care of  this patient on the date of this encounter to include face-to-face time with the patient, as well as: Preparing to see the patient (review of tests - poc tests/urinalysis). Obtaining history Performing a medically appropriate examination and/or evaluation Instructions to patient and wife when to go to ED if needed.   Ordering medications, tests. Documenting clinical information in the electronic or other health record. I My nursing staff have aided in the documentation of this note on the behalf of [name], MD,as directed by  [name], MD and thoroughly  reviewed by [name], MD.  Follow-up: No follow-ups on file.  An After Visit Summary was printed and given to the patient.  [name], MD [facility] [phone]

## 2020-11-07 ENCOUNTER — Encounter: Payer: Self-pay | Admitting: Family Medicine

## 2020-11-07 LAB — POC COVID19 BINAXNOW: SARS Coronavirus 2 Ag: NEGATIVE

## 2020-11-07 LAB — POCT INFLUENZA A/B
Influenza A, POC: NEGATIVE
Influenza B, POC: NEGATIVE

## 2020-11-08 ENCOUNTER — Ambulatory Visit (INDEPENDENT_AMBULATORY_CARE_PROVIDER_SITE_OTHER): Payer: Medicare Other | Admitting: Nurse Practitioner

## 2020-11-08 ENCOUNTER — Other Ambulatory Visit: Payer: Self-pay | Admitting: Nurse Practitioner

## 2020-11-08 ENCOUNTER — Encounter: Payer: Self-pay | Admitting: Nurse Practitioner

## 2020-11-08 VITALS — BP 102/54 | HR 94 | Temp 100.2°F

## 2020-11-08 DIAGNOSIS — I700 Atherosclerosis of aorta: Secondary | ICD-10-CM | POA: Diagnosis not present

## 2020-11-08 DIAGNOSIS — I708 Atherosclerosis of other arteries: Secondary | ICD-10-CM | POA: Diagnosis not present

## 2020-11-08 DIAGNOSIS — E782 Mixed hyperlipidemia: Secondary | ICD-10-CM

## 2020-11-08 DIAGNOSIS — R4182 Altered mental status, unspecified: Secondary | ICD-10-CM

## 2020-11-08 DIAGNOSIS — R0600 Dyspnea, unspecified: Secondary | ICD-10-CM

## 2020-11-08 DIAGNOSIS — R058 Other specified cough: Secondary | ICD-10-CM | POA: Diagnosis not present

## 2020-11-08 DIAGNOSIS — M25552 Pain in left hip: Secondary | ICD-10-CM

## 2020-11-08 DIAGNOSIS — I2510 Atherosclerotic heart disease of native coronary artery without angina pectoris: Secondary | ICD-10-CM | POA: Diagnosis not present

## 2020-11-08 DIAGNOSIS — M25551 Pain in right hip: Secondary | ICD-10-CM

## 2020-11-08 DIAGNOSIS — I70203 Unspecified atherosclerosis of native arteries of extremities, bilateral legs: Secondary | ICD-10-CM | POA: Diagnosis not present

## 2020-11-08 DIAGNOSIS — J9611 Chronic respiratory failure with hypoxia: Secondary | ICD-10-CM

## 2020-11-08 DIAGNOSIS — J441 Chronic obstructive pulmonary disease with (acute) exacerbation: Secondary | ICD-10-CM | POA: Diagnosis not present

## 2020-11-08 DIAGNOSIS — Z9981 Dependence on supplemental oxygen: Secondary | ICD-10-CM

## 2020-11-08 DIAGNOSIS — R0689 Other abnormalities of breathing: Secondary | ICD-10-CM

## 2020-11-08 DIAGNOSIS — I10 Essential (primary) hypertension: Secondary | ICD-10-CM | POA: Diagnosis not present

## 2020-11-08 DIAGNOSIS — J9811 Atelectasis: Secondary | ICD-10-CM | POA: Diagnosis not present

## 2020-11-08 LAB — CBC WITH DIFFERENTIAL/PLATELET
Basophils Absolute: 0 10*3/uL (ref 0.0–0.2)
Basos: 0 %
EOS (ABSOLUTE): 0.2 10*3/uL (ref 0.0–0.4)
Eos: 1 %
Hematocrit: 39.3 % (ref 37.5–51.0)
Hemoglobin: 13.4 g/dL (ref 13.0–17.7)
Immature Grans (Abs): 0.1 10*3/uL (ref 0.0–0.1)
Immature Granulocytes: 1 %
Lymphocytes Absolute: 0.7 10*3/uL (ref 0.7–3.1)
Lymphs: 5 %
MCH: 32.9 pg (ref 26.6–33.0)
MCHC: 34.1 g/dL (ref 31.5–35.7)
MCV: 97 fL (ref 79–97)
Monocytes Absolute: 1.2 10*3/uL — ABNORMAL HIGH (ref 0.1–0.9)
Monocytes: 8 %
Neutrophils Absolute: 12.6 10*3/uL — ABNORMAL HIGH (ref 1.4–7.0)
Neutrophils: 85 %
Platelets: 171 10*3/uL (ref 150–450)
RBC: 4.07 x10E6/uL — ABNORMAL LOW (ref 4.14–5.80)
RDW: 13.3 % (ref 11.6–15.4)
WBC: 14.9 10*3/uL — ABNORMAL HIGH (ref 3.4–10.8)

## 2020-11-08 MED ORDER — CEFTRIAXONE SODIUM 1 G IJ SOLR
1.0000 g | Freq: Once | INTRAMUSCULAR | Status: AC
  Administered 2020-11-08: 1 g via INTRAMUSCULAR

## 2020-11-08 MED ORDER — TRIAMCINOLONE ACETONIDE 40 MG/ML IJ SUSP
80.0000 mg | Freq: Once | INTRAMUSCULAR | Status: AC
  Administered 2020-11-08: 80 mg via INTRAMUSCULAR

## 2020-11-08 MED ORDER — IPRATROPIUM-ALBUTEROL 0.5-2.5 (3) MG/3ML IN SOLN: 3.0000 mL | RESPIRATORY_TRACT | 3 refills | Status: DC | PRN

## 2020-11-08 MED ORDER — LEVOFLOXACIN 750 MG PO TABS: 750.0000 mg | ORAL_TABLET | Freq: Every day | ORAL | 0 refills | Status: AC

## 2020-11-08 MED ORDER — PREDNISONE 50 MG PO TABS: 50.0000 mg | ORAL_TABLET | Freq: Every day | ORAL | 0 refills | Status: DC

## 2020-11-08 NOTE — Progress Notes (Signed)
New Patient Office Visit  Subjective:  Patient ID: William Clay, male    DOB: 04/15/1941  Age: 79 y.o. MRN: [id number]  CC: Altered mental status   HPI William Clay presents for altered mental status and cough. Associated symptoms include fever, dyspnea, generalized weakness, and bilateral hip pain. Spouse noted he has exhibited intermittent confusion for 2 days. The patient, who is accompanied by his wife, states he has been feeling poorly for approximately 1 week. He has a chronic cough that has become more persistent and productive with yellowish/brown sputum, particularly in the a.m. He was ambulating with a cane but has to converted to a walker during the past week. He presents today in a wc. He recently began home O2 at 2L/min via [place] that he is wearing for today's visit. He began Cipro for UTI treatment 2 days ago. He has received COVID-19, flu, and pneumonia vaccinations. Rapid COVID-19 test negative today.Past medical history includes COPD, CAD, hypertension, GERD, OSA with CPAP use, and depression.  Past Medical History:  Diagnosis Date  . Atherosclerosis of aorta ([place])   . Bowel obstruction ([place]) 2018  . BPH (benign prostatic hyperplasia)   . COPD (chronic obstructive pulmonary disease) ([place])   . COPD (chronic obstructive pulmonary disease) ([place])   . GERD (gastroesophageal reflux disease)   . Hypertension   . Hypoxemia   . Lumbar stenosis with neurogenic claudication 04/24/2017  . Mixed hyperlipidemia   . Osteoarthritis   . Pneumonia 9935,7017  . Primary insomnia   . Sleep apnea   . Spontaneous ecchymoses     Past Surgical History:  Procedure Laterality Date  . ABDOMINAL AORTIC ANEURYSM REPAIR    . BACK SURGERY N/A [phone], 2021   Lower back and cervical spine  . BACK SURGERY  1984   upper back  . CATARACT EXTRACTION Left 2017  . CATARACT EXTRACTION Right 2017  . CHOLECYSTECTOMY  2018  . HERNIA REPAIR  2018  . PROSTATECTOMY    .  ROTATOR CUFF REPAIR Right 2015  . ROTATOR CUFF REPAIR Left 2017    Family History  Problem Relation Age of Onset  . Heart failure Mother   . Diabetes Mother   . CAD Mother   . Hypertension Mother     Social History   Socioeconomic History  . Marital status: Married    Spouse name: Not on file  . Number of children: 2  . Years of education: Not on file  . Highest education level: Not on file  Occupational History  . Occupation: retired    Comment: Event organiser  Tobacco Use  . Smoking status: Former Smoker    Packs/day: 2.00    Years: 50.00    Pack years: 100.00    Types: Cigarettes    Quit date: 12/24/2007    Years since quitting: 12.8  . Smokeless tobacco: Never Used  Vaping Use  . Vaping Use: Never used  Substance and Sexual Activity  . Alcohol use: No    Alcohol/week: 0.0 standard drinks  . Drug use: No  . Sexual activity: Not on file  Other Topics Concern  . Not on file  Social History Narrative  . Not on file   Social Determinants of Health   Financial Resource Strain:   . Difficulty of Paying Living Expenses: Not on file  Food Insecurity: No Food Insecurity  . Worried About Charity fundraiser in the Last Year: Never true  . Ran Out of Food in the  Last Year: Never true  Transportation Needs:   . Lack of Transportation (Medical): Not on file  . Lack of Transportation (Non-Medical): Not on file  Physical Activity:   . Days of Exercise per Week: Not on file  . Minutes of Exercise per Session: Not on file  Stress:   . Feeling of Stress : Not on file  Social Connections:   . Frequency of Communication with Friends and Family: Not on file  . Frequency of Social Gatherings with Friends and Family: Not on file  . Attends Religious Services: Not on file  . Active Member of Clubs or Organizations: Not on file  . Attends Archivist Meetings: Not on file  . Marital Status: Not on file  Intimate Partner Violence:   . Fear of Current or Ex-Partner:  Not on file  . Emotionally Abused: Not on file  . Physically Abused: Not on file  . Sexually Abused: Not on file    ROS Review of Systems  Constitutional: Positive for activity change, appetite change, fatigue and fever.  HENT: Positive for hearing loss (hard of hearing). Negative for rhinorrhea, sinus pressure, sinus pain and sneezing.   Respiratory: Positive for cough, shortness of breath and wheezing.   Cardiovascular: Negative for chest pain, palpitations and leg swelling.  Gastrointestinal: Negative for abdominal distention, abdominal pain, constipation, diarrhea, nausea and vomiting.  Genitourinary: Positive for dysuria (currently receiving UTI treatment). Negative for flank pain, frequency, penile pain and urgency.       Dribbling urine post void  Musculoskeletal: Positive for arthralgias (bilateral hip pain) and myalgias.  Skin: Negative.   Neurological: Positive for weakness and headaches. Negative for syncope.  Psychiatric/Behavioral: Positive for confusion (per spouse). Negative for sleep disturbance. The patient is not nervous/anxious.     Objective:    BP (!) 102/54   Pulse 94   Temp 100.2 F (37.9 C) (Tympanic)   SpO2 92%   Physical Exam Vitals reviewed.  Constitutional:      Appearance: Normal appearance.  HENT:     Head: Normocephalic.     Right Ear: Tympanic membrane normal.     Left Ear: Tympanic membrane normal.     Nose: Nose normal.     Mouth/Throat:     Mouth: Mucous membranes are dry.  Cardiovascular:     Rate and Rhythm: Normal rate and regular rhythm.     Pulses: Normal pulses.     Heart sounds: Normal heart sounds.  Pulmonary:     Breath sounds: Wheezing (faint expiratory wheezing scattered throughout all lobes) and rhonchi (postior lungs) present.     Comments: Dyspnea noted with talking, pt using accessory muscles to breathe Abdominal:     General: Bowel sounds are normal.     Palpations: Abdomen is soft.  Musculoskeletal:         General: Tenderness present.     Comments: Wheelchair bound due to generalized weakness and bilateral hip pain  Skin:    General: Skin is dry.     Capillary Refill: Capillary refill takes less than 2 seconds.     Comments: Skin hot to touch  Neurological:     General: No focal deficit present.     Mental Status: He is alert and oriented to person, place, and time.     Sensory: Sensory deficit (hard of hearing) present.     Assessment & Plan:   Problem List Items Addressed This Visit      Respiratory   Chronic respiratory failure  with hypoxia ([place])   Relevant Orders   DG Chest 2 View    Other Visit Diagnoses    Dyspnea and respiratory abnormalities    -  Primary   Relevant Medications   cefTRIAXone (ROCEPHIN) injection 1 g   triamcinolone acetonide (KENALOG-40) injection 80 mg   levofloxacin (LEVAQUIN) 750 MG tablet   predniSONE (DELTASONE) 50 MG tablet   Other Relevant Orders   DG Chest 2 View   CBC with Differential   Altered mental status, unspecified altered mental status type       Relevant Orders   DG Chest 2 View   Bilateral hip pain  -bilateral hip x-ray         Outpatient Encounter Medications as of 11/08/2020  Medication Sig  . albuterol (PROVENTIL HFA;VENTOLIN HFA) 108 (90 Base) MCG/ACT inhaler Inhale two puffs every four to six hours as needed for cough or wheeze.  Marland Kitchen amLODipine (NORVASC) 10 MG tablet TAKE 1 TABLET BY MOUTH ONCE DAILY  . cetirizine (ZYRTEC) 10 MG tablet Take 1 tablet (10 mg total) by mouth daily.  . ciprofloxacin (CIPRO) 500 MG tablet Take 1 tablet (500 mg total) by mouth 2 (two) times daily.  Marland Kitchen levofloxacin (LEVAQUIN) 750 MG tablet Take 1 tablet (750 mg total) by mouth daily for 7 days.  Marland Kitchen losartan (COZAAR) 100 MG tablet Take 1 tablet (100 mg total) by mouth daily.  . Multiple Vitamins-Minerals (MULTIVITAMIN WITH MINERALS) tablet Take 1 tablet by mouth daily.  Marland Kitchen omega-3 acid ethyl esters (LOVAZA) 1 g capsule Take 2 capsules (2 g total) by  mouth 2 (two) times daily.  Marland Kitchen omeprazole (PRILOSEC) 40 MG capsule Take 1 capsule (40 mg total) by mouth daily.  . pravastatin (PRAVACHOL) 20 MG tablet TAKE 1 TABLET(20 MG) BY MOUTH DAILY  . predniSONE (DELTASONE) 50 MG tablet Take 1 tablet (50 mg total) by mouth daily with breakfast.  . pregabalin (LYRICA) 50 MG capsule Take 100 mg by mouth 2 (two) times daily.   . Probiotic Product (RESTORA [place]) Take 1 tablet by mouth daily.  . sertraline (ZOLOFT) 100 MG tablet Take 1 tablet (100 mg total) by mouth daily.  . Tiotropium Bromide-Olodaterol (STIOLTO RESPIMAT) 2.5-2.5 MCG/ACT AERS Inhale 2 puffs into the lungs daily.    Facility-Administered Encounter Medications as of 11/08/2020  Medication  . cefTRIAXone (ROCEPHIN) injection 1 g  . triamcinolone acetonide (KENALOG-40) injection 80 mg   [facility] chest and [facility] immediately Continue O2 at 2L/min  Prednisone 50 mg daily for 5 days Stop Cipro Begin Levaquin 750 mg daily for 7 days Use Albuterol nebulizer every 4 hours as needed Seek emergency medical care if symptoms worsen or fail to improve  Follow-up: PRN, chest x-ray and labs pending  Rip Harbour, NP  [place], [place]

## 2020-11-09 ENCOUNTER — Other Ambulatory Visit: Payer: Self-pay | Admitting: Nurse Practitioner

## 2020-11-09 ENCOUNTER — Other Ambulatory Visit: Payer: Self-pay

## 2020-11-09 DIAGNOSIS — R0609 Other forms of dyspnea: Secondary | ICD-10-CM

## 2020-11-09 DIAGNOSIS — R0600 Dyspnea, unspecified: Secondary | ICD-10-CM

## 2020-11-09 MED ORDER — ALBUTEROL SULFATE (2.5 MG/3ML) 0.083% IN NEBU: 2.5000 mg | INHALATION_SOLUTION | Freq: Four times a day (QID) | RESPIRATORY_TRACT | 3 refills | Status: DC | PRN

## 2020-11-10 ENCOUNTER — Other Ambulatory Visit: Payer: Self-pay

## 2020-11-10 ENCOUNTER — Telehealth: Payer: Self-pay

## 2020-11-10 DIAGNOSIS — M25551 Pain in right hip: Secondary | ICD-10-CM

## 2020-11-10 LAB — SARS-COV-2, NAA 2 DAY TAT

## 2020-11-10 LAB — URINE CULTURE

## 2020-11-10 LAB — NOVEL CORONAVIRUS, NAA: SARS-CoV-2, NAA: NOT DETECTED

## 2020-11-10 NOTE — Chronic Care Management (AMB) (Signed)
Chronic Care Management [facility]   Name: William Clay  MRN: [id number] DOB: Mar 05, 1941  Reason for Encounter: Medication Review    PCP : [name], MD  Allergies:   Allergies  Allergen Reactions  . Cefadroxil Shortness Of Breath  . Lisinopril Other (See Comments)    Dyspnea Dyspnea   . Simvastatin Other (See Comments)    Muscle Cramps Muscle Cramps   . Amoxicillin     Mouth sores   . Gabapentin   . Metoprolol Other (See Comments)    Leg cramps  . Potassium Clavulanate [Clavulanic Acid]     diarrhea    Medications: Outpatient Encounter Medications as of 11/10/2020  Medication Sig  . albuterol (PROVENTIL HFA;VENTOLIN HFA) 108 (90 Base) MCG/ACT inhaler Inhale two puffs every four to six hours as needed for cough or wheeze.  Marland Kitchen albuterol (PROVENTIL) (2.5 MG/3ML) 0.083% nebulizer solution Take 3 mLs (2.5 mg total) by nebulization every 6 (six) hours as needed for wheezing or shortness of breath.  Marland Kitchen amLODipine (NORVASC) 10 MG tablet TAKE 1 TABLET BY MOUTH ONCE DAILY  . cetirizine (ZYRTEC) 10 MG tablet Take 1 tablet (10 mg total) by mouth daily.  . ciprofloxacin (CIPRO) 500 MG tablet Take 1 tablet (500 mg total) by mouth 2 (two) times daily.  Marland Kitchen levofloxacin (LEVAQUIN) 750 MG tablet Take 1 tablet (750 mg total) by mouth daily for 7 days.  Marland Kitchen losartan (COZAAR) 100 MG tablet Take 1 tablet (100 mg total) by mouth daily.  . Multiple Vitamins-Minerals (MULTIVITAMIN WITH MINERALS) tablet Take 1 tablet by mouth daily.  Marland Kitchen omega-3 acid ethyl esters (LOVAZA) 1 g capsule Take 2 capsules (2 g total) by mouth 2 (two) times daily.  Marland Kitchen omeprazole (PRILOSEC) 40 MG capsule Take 1 capsule (40 mg total) by mouth daily.  . pravastatin (PRAVACHOL) 20 MG tablet TAKE 1 TABLET(20 MG) BY MOUTH DAILY  . predniSONE (DELTASONE) 50 MG tablet Take 1 tablet (50 mg total) by mouth daily with breakfast.  . pregabalin (LYRICA) 50 MG capsule Take 100 mg by mouth 2 (two) times daily.   .  Probiotic Product (RESTORA PO) Take 1 tablet by mouth daily.  . sertraline (ZOLOFT) 100 MG tablet Take 1 tablet (100 mg total) by mouth daily.  . Tiotropium Bromide-Olodaterol (STIOLTO RESPIMAT) 2.5-2.5 MCG/ACT AERS Inhale 2 puffs into the lungs daily.    No facility-administered encounter medications on file as of 11/10/2020.    Current Diagnosis: Patient Active Problem List   Diagnosis Date Noted  . Impaired glucose tolerance 10/15/2020  . Drug-induced myopathy 10/15/2020  . Class 1 obesity due to excess calories with serious comorbidity and body mass index (BMI) of 30.0 to 30.9 in adult 05/26/2020  . Chronic lumbar radiculopathy 05/26/2020  . Gastroesophageal reflux disease 04/12/2020  . S/P lumbar fusion 04/12/2020  . Sensorineural hearing loss (SNHL) 04/12/2020  . OSA on CPAP 04/12/2020  . Mild recurrent major depression ([place]) 02/25/2020  . Mixed hyperlipidemia 02/25/2020  . Chronic respiratory failure with hypoxia ([place]) 09/02/2017  . Oxygen dependent 04/21/2017  . Coronary artery disease involving native coronary artery of native heart 09/23/2016  . Essential hypertension, benign 03/17/2015  . Chronic obstructive pulmonary disease ([place]) 03/15/2015      Follow-Up:  Patient Assistance Coordination - Reviewed chart and adherence measures. Per [employer] [facility], total gaps - all measures are (2) .  Total gaps -star messages are(0). 1. Wellness Bundle not performed 2. Annual Wellness not preformed   William Clay, [place]  notified  William Clay, William Clay [phone]

## 2020-11-13 ENCOUNTER — Other Ambulatory Visit: Payer: Self-pay | Admitting: Family Medicine

## 2020-11-14 DIAGNOSIS — M961 Postlaminectomy syndrome, not elsewhere classified: Secondary | ICD-10-CM | POA: Diagnosis not present

## 2020-11-14 DIAGNOSIS — M5432 Sciatica, left side: Secondary | ICD-10-CM | POA: Diagnosis not present

## 2020-11-14 DIAGNOSIS — M4326 Fusion of spine, lumbar region: Secondary | ICD-10-CM | POA: Diagnosis not present

## 2020-11-28 ENCOUNTER — Encounter: Payer: Medicare Other | Admitting: Family Medicine

## 2020-11-28 ENCOUNTER — Ambulatory Visit: Payer: Medicare Other

## 2020-11-30 ENCOUNTER — Ambulatory Visit (INDEPENDENT_AMBULATORY_CARE_PROVIDER_SITE_OTHER): Payer: Medicare Other | Admitting: Family Medicine

## 2020-11-30 ENCOUNTER — Other Ambulatory Visit: Payer: Self-pay

## 2020-11-30 ENCOUNTER — Encounter: Payer: Self-pay | Admitting: Family Medicine

## 2020-11-30 VITALS — BP 124/76 | HR 69 | Resp 18 | Ht 69.0 in | Wt 196.4 lb

## 2020-11-30 DIAGNOSIS — H539 Unspecified visual disturbance: Secondary | ICD-10-CM

## 2020-11-30 DIAGNOSIS — Z0000 Encounter for general adult medical examination without abnormal findings: Secondary | ICD-10-CM

## 2020-11-30 NOTE — Progress Notes (Signed)
Subjective:   William Clay is a 79 y.o. male who presents for Medicare Annual/Subsequent preventive examination.   Cardiac Risk Factors include: advanced age (>36men, >31 women)     Objective:    Today's Vitals   11/30/20 1428  BP: 124/76  Pulse: 69  Resp: 18  SpO2: 96%  Weight: 196 lb 6.4 oz (89.1 kg)  Height: 5\' 9"  (1.753 m)  PainSc: 6    Body mass index is 29 kg/m.  Advanced Directives 11/30/2020  Does Patient Have a Medical Advance Directive? Yes  Type of Advance Directive [place] in Chart? No - copy requested    Current Medications (verified) Outpatient Encounter Medications as of 11/30/2020  Medication Sig  . albuterol (PROVENTIL HFA;VENTOLIN HFA) 108 (90 Base) MCG/ACT inhaler Inhale two puffs every four to six hours as needed for cough or wheeze.  Marland Kitchen albuterol (PROVENTIL) (2.5 MG/3ML) 0.083% nebulizer solution Take 3 mLs (2.5 mg total) by nebulization every 6 (six) hours as needed for wheezing or shortness of breath.  Marland Kitchen amLODipine (NORVASC) 10 MG tablet TAKE 1 TABLET BY MOUTH ONCE DAILY  . cetirizine (ZYRTEC) 10 MG tablet Take 1 tablet (10 mg total) by mouth daily.  Marland Kitchen losartan (COZAAR) 100 MG tablet Take 1 tablet (100 mg total) by mouth daily.  . Multiple Vitamins-Minerals (MULTIVITAMIN WITH MINERALS) tablet Take 1 tablet by mouth daily.  Marland Kitchen omeprazole (PRILOSEC) 40 MG capsule TAKE 1 CAPSULE(40 MG) BY MOUTH DAILY  . pravastatin (PRAVACHOL) 20 MG tablet TAKE 1 TABLET(20 MG) BY MOUTH DAILY  . pregabalin (LYRICA) 50 MG capsule Take 100 mg by mouth 2 (two) times daily.   . Probiotic Product (RESTORA PO) Take 1 tablet by mouth daily.  . sertraline (ZOLOFT) 100 MG tablet Take 1 tablet (100 mg total) by mouth daily.  . Tiotropium Bromide-Olodaterol 2.5-2.5 MCG/ACT AERS Inhale 2 puffs into the lungs daily.   Marland Kitchen omega-3 acid ethyl esters (LOVAZA) 1 g capsule Take 2 capsules (2 g total) by mouth 2 (two) times daily.  (Patient not taking: Reported on 11/30/2020)  . [DISCONTINUED] ciprofloxacin (CIPRO) 500 MG tablet Take 1 tablet (500 mg total) by mouth 2 (two) times daily.  . [DISCONTINUED] predniSONE (DELTASONE) 50 MG tablet Take 1 tablet (50 mg total) by mouth daily with breakfast.   No facility-administered encounter medications on file as of 11/30/2020.    Allergies (verified) Cefadroxil, Lisinopril, Simvastatin, Amoxicillin, Gabapentin, Metoprolol, and Potassium clavulanate [clavulanic acid]   History: Past Medical History:  Diagnosis Date  . Atherosclerosis of aorta ([place])   . Bowel obstruction ([place]) 2018  . BPH (benign prostatic hyperplasia)   . COPD (chronic obstructive pulmonary disease) ([place])   . COPD (chronic obstructive pulmonary disease) ([place])   . GERD (gastroesophageal reflux disease)   . Hypertension   . Hypoxemia   . Lumbar stenosis with neurogenic claudication 04/24/2017  . Mixed hyperlipidemia   . Osteoarthritis   . Pneumonia [phone]  . Primary insomnia   . Sleep apnea   . Spontaneous ecchymoses    Past Surgical History:  Procedure Laterality Date  . ABDOMINAL AORTIC ANEURYSM REPAIR    . BACK SURGERY N/A [phone], 2021   Lower back and cervical spine  . BACK SURGERY  1984   upper back  . CATARACT EXTRACTION Left 2017  . CATARACT EXTRACTION Right 2017  . CHOLECYSTECTOMY  2018  . HERNIA REPAIR  2018  . PROSTATECTOMY    . ROTATOR CUFF  REPAIR Right 2015  . ROTATOR CUFF REPAIR Left 2017   Family History  Problem Relation Age of Onset  . Heart failure Mother   . Diabetes Mother   . CAD Mother   . Hypertension Mother    Social History   Socioeconomic History  . Marital status: Married    Spouse name: Not on file  . Number of children: 2  . Years of education: Not on file  . Highest education level: Not on file  Occupational History  . Occupation: retired    Comment: Event organiser  Tobacco Use  . Smoking status: Former Smoker     Packs/day: 2.00    Years: 50.00    Pack years: 100.00    Types: Cigarettes    Quit date: 12/24/2007    Years since quitting: 12.9  . Smokeless tobacco: Never Used  Vaping Use  . Vaping Use: Never used  Substance and Sexual Activity  . Alcohol use: No    Alcohol/week: 0.0 standard drinks  . Drug use: No  . Sexual activity: Not on file  Other Topics Concern  . Not on file  Social History Narrative  . Not on file   Social Determinants of Health   Financial Resource Strain: Not on file  Food Insecurity: No Food Insecurity  . Worried About Charity fundraiser in the Last Year: Never true  . Ran Out of Food in the Last Year: Never true  Transportation Needs: Not on file  Physical Activity: Not on file  Stress: Not on file  Social Connections: Not on file    Tobacco Counseling Counseling given: Not Answered   Clinical Intake:  Pre-visit preparation completed: Yes  Pain : 0-10 Pain Score: 6  Pain Type: Chronic pain Pain Location: Back Pain Descriptors / Indicators: Aching Pain Onset: More than a month ago Pain Frequency: Constant     Nutritional Risks: None  How often do you need to have someone help you when you read instructions, pamphlets, or other written materials from your doctor or pharmacy?: 1 - Never What is the last grade level you completed in school?: 15   Interpreter Needed?: No      Activities of Daily Living In your present state of health, do you have any difficulty performing the following activities: 11/30/2020 05/26/2020  Hearing? Y N  Vision? Y N  Difficulty concentrating or making decisions? Y N  Walking or climbing stairs? Y N  Dressing or bathing? N N  Doing errands, shopping? N N  Preparing Food and eating ? N -  Using the Toilet? N -  In the past six months, have you accidently leaked urine? Y -  Do you have problems with loss of bowel control? N -  Managing your Medications? N -  Managing your Finances? N -  Housekeeping or  managing your Housekeeping? N -  Some recent data might be hidden    Patient Care Team: [name], MD as PCP - General (Family Medicine) Burnice Logan, [facility] as Pharmacist (Pharmacist) Murlean Iba, MD as Referring Physician (Orthopedic Surgery)    Assessment:   This is a routine wellness examination for Tonny.  Hearing/Vision screen bilat eye 20/50, right 20/40 left 20/32 Dietary issues and exercise activities discussed: Current Exercise Habits: The patient does not participate in regular exercise at present Use cane for walking Goals    . Pharmacy Care Plan     CARE PLAN ENTRY  Current Barriers:  . Chronic Disease Management  support, education, and care coordination needs related to Hypertension and Hyperlipidemia   Hypertension . Pharmacist Clinical Goal(s): o Over the next 90 days, patient will work with PharmD and providers to maintain BP goal <140/90 . Current regimen:  o Amlodipine 10 mg daily and Losartan 100 mg daily.  . Interventions: o Encouraged patient continue the good work with managing blood pressure.  o Patient has resumed walking to mailbox after surgery.  . Patient self care activities - Over the next 90 days, patient will: o Check BP weekly, document, and provide at future appointments o Ensure daily salt intake < 2300 mg/day  Hyperlipidemia . Pharmacist Clinical Goal(s): o Over the next 90 days, patient will work with PharmD and providers to maintain LDL goal < 100 . Current regimen:  o Pravastatin 20 mg and Omega 3 1000 mg bid. . Interventions: o Discussed elevated Triglyceride levels. o Patient eating lots of garden vegetables this Summer.  . Patient self care activities - Over the next 90 days, patient will: o Recommend patient work to eat a low fat, heart healthy diet in order to improve next Triglyceride levels.  Medication management . Pharmacist Clinical Goal(s): o Over the next 90 days, patient will work with PharmD and  providers to maintain optimal medication adherence . Current pharmacy: [facility] . Interventions o Comprehensive medication review performed. o Continue current medication management strategy . Patient self care activities - Over the next 90 days, patient will: o Focus on medication adherence by continuing to use pill box.  o Take medications as prescribed o Report any questions or concerns to PharmD and/or provider(s)  Please see past updates related to this goal by clicking on the "Past Updates" button in the selected goal        Depression Screen PHQ 2/9 Scores 11/30/2020 05/10/2020  PHQ - 2 Score 0 0    Fall Risk Fall Risk  11/30/2020 05/11/2020 05/11/2020 11/06/2018 07/14/2017  Falls in the past year? 1 0 0 0 No  Comment - - - Emmi Telephone Survey: data to providers prior to load Emmi Telephone Survey: data to providers prior to load  Number falls in past yr: 1 0 0 - -  Injury with Fall? 0 0 0 - -  Risk for fall due to : History of fall(s) - - - -  Follow up - Falls evaluation completed;Falls prevention discussed Falls evaluation completed;Falls prevention discussed - -    FALL RISK PREVENTION PERTAINING TO THE HOME:  Any stairs in or around the home? yes If so, are there any without handrails? yes Home free of loose throw rugs in walkways, pet beds, electrical cords, etc? yes Adequate lighting in your home to reduce risk of falls? Yes Ramp on the back of house-pt checking with neurosurgery to see if stimulator would help legs-left leg weakness due to nerve damage-back pain  ASSISTIVE DEVICES UTILIZED TO PREVENT FALLS:  Life alert? no Use of a cane, walker or w/c uses an cane to walk Grab bars in the bathroom? yes Shower chair or bench in shower? yes Elevated toilet seat or a handicapped toilet yes Cognitive Function:     6CIT Screen 11/30/2020  What Year? 0 points  What month? 0 points  What time? 0 points  Count back from 20 4 points  Months in reverse 0 points   Repeat phrase 2 points  Total Score 6    Immunizations Immunization History  Administered Date(s) Administered  . Fluad Quad(high Dose 65+) 09/12/2020  .  Influenza Split 08/24/2015  . Moderna SARS-COVID-2 Vaccination 02/21/2020, 03/12/2020  . Pneumococcal Conjugate-13 10/23/2014  . Pneumococcal Polysaccharide-23 10/04/2020   Qualifies for Shingles Vaccine? yes Screening Tests Health Maintenance  Topic Date Due  . Hepatitis C Screening  Never done  . TETANUS/TDAP  Never done  . COVID-19 Vaccine (2 - Moderna 3-dose booster series) 04/09/2020  . INFLUENZA VACCINE  Completed  . PNA vac Low Risk Adult  Completed    Health Maintenance  Health Maintenance Due  Topic Date Due  . Hepatitis C Screening  Never done  . TETANUS/TDAP  Never done  . COVID-19 Vaccine (2 - Moderna 3-dose booster series) 04/09/2020   Colon screening-2 years ago    Additional Screening:   Vision Screening: Recommended annual ophthalmology exams for early detection of glaucoma and other disorders of the eye. Is the patient up to date with their annual eye exam? no Who is the provider or what is the name of the office in which the patient attends annual eye exams? [facility] If pt is not established with a provider, would they like to be referred to a provider to establish care? yes.   Dental Screening: Recommended annual dental exams for proper oral hygiene  1. Visual changes - Ambulatory referral to Ophthalmology  2. Medicare annual wellness visit, subsequent Needs eye exam   Plan:     I have personally reviewed and noted the following in the patient's chart:   . Medical and social history . Use of alcohol, tobacco or illicit drugs  . Current medications and supplements . Functional ability and status . Nutritional status . Physical activity . Advanced directives . List of other physicians . Hospitalizations, surgeries, and ER visits in previous 12 months . Vitals . Screenings to  include cognitive, depression, and falls . Referrals and appointments  In addition, I have reviewed and discussed with patient certain preventive protocols, quality metrics, and best practice recommendations. A written personalized care plan for preventive services as well as general preventive health recommendations were provided to patient.     Mertha Baars, MD   11/30/2020

## 2020-11-30 NOTE — Patient Instructions (Addendum)
  William Clay , Thank you for taking time to come for your Medicare Wellness Visit. I appreciate your ongoing commitment to your health goals. Please review the following plan we discussed and let me know if I can assist you in the future.   These are the goals we discussed: schedule an eye exam Consider PT for strengthening-concern for fall risk-pt seeing neurosurgery to discuss stimulator Goals    . Pharmacy Care Plan     CARE PLAN ENTRY  Current Barriers:  . Chronic Disease Management support, education, and care coordination needs related to Hypertension and Hyperlipidemia   Hypertension . Pharmacist Clinical Goal(s): o Over the next 90 days, patient will work with PharmD and providers to maintain BP goal <140/90 . Current regimen:  o Amlodipine 10 mg daily and Losartan 100 mg daily.  . Interventions: o Encouraged patient continue the good work with managing blood pressure.  o Patient has resumed walking to mailbox after surgery.  . Patient self care activities - Over the next 90 days, patient will: o Check BP weekly, document, and provide at future appointments o Ensure daily salt intake < 2300 mg/day  Hyperlipidemia . Pharmacist Clinical Goal(s): o Over the next 90 days, patient will work with PharmD and providers to maintain LDL goal < 100 . Current regimen:  o Pravastatin 20 mg and Omega 3 1000 mg bid. . Interventions: o Discussed elevated Triglyceride levels. o Patient eating lots of garden vegetables this Summer.  . Patient self care activities - Over the next 90 days, patient will: o Recommend patient work to eat a low fat, heart healthy diet in order to improve next Triglyceride levels.  Medication management . Pharmacist Clinical Goal(s): o Over the next 90 days, patient will work with PharmD and providers to maintain optimal medication adherence . Current pharmacy: [facility] . Interventions o Comprehensive medication review performed. o Continue current medication  management strategy . Patient self care activities - Over the next 90 days, patient will: o Focus on medication adherence by continuing to use pill box.  o Take medications as prescribed o Report any questions or concerns to PharmD and/or provider(s)  Please see past updates related to this goal by clicking on the "Past Updates" button in the selected goal         This is a list of the screening recommended for you and due dates:  Health Maintenance  Topic Date Due  .  Hepatitis C: One time screening is recommended by Center for Disease Control  (CDC) for  adults born from [birth year] through 1963.   Never done  . Tetanus Vaccine  Never done  . COVID-19 Vaccine (2 - Moderna 3-dose booster series) 04/09/2020  . Flu Shot  Completed  . Pneumonia vaccines  Completed

## 2020-12-06 NOTE — Progress Notes (Signed)
This encounter was created in error - please disregard.

## 2020-12-08 DIAGNOSIS — M961 Postlaminectomy syndrome, not elsewhere classified: Secondary | ICD-10-CM | POA: Diagnosis not present

## 2020-12-08 DIAGNOSIS — M5432 Sciatica, left side: Secondary | ICD-10-CM | POA: Diagnosis not present

## 2020-12-08 DIAGNOSIS — M4316 Spondylolisthesis, lumbar region: Secondary | ICD-10-CM | POA: Diagnosis not present

## 2020-12-08 DIAGNOSIS — M4326 Fusion of spine, lumbar region: Secondary | ICD-10-CM | POA: Diagnosis not present

## 2021-01-03 ENCOUNTER — Other Ambulatory Visit: Payer: Self-pay

## 2021-01-03 ENCOUNTER — Other Ambulatory Visit: Payer: Medicare Other

## 2021-01-03 DIAGNOSIS — E782 Mixed hyperlipidemia: Secondary | ICD-10-CM | POA: Diagnosis not present

## 2021-01-03 DIAGNOSIS — I10 Essential (primary) hypertension: Secondary | ICD-10-CM

## 2021-01-03 DIAGNOSIS — R7302 Impaired glucose tolerance (oral): Secondary | ICD-10-CM

## 2021-01-03 DIAGNOSIS — I2510 Atherosclerotic heart disease of native coronary artery without angina pectoris: Secondary | ICD-10-CM

## 2021-01-04 ENCOUNTER — Encounter: Payer: Self-pay | Admitting: Family Medicine

## 2021-01-04 LAB — LIPID PANEL
Chol/HDL Ratio: 6.2 ratio — ABNORMAL HIGH (ref 0.0–5.0)
Cholesterol, Total: 237 mg/dL — ABNORMAL HIGH (ref 100–199)
HDL: 38 mg/dL — ABNORMAL LOW (ref >39)
LDL Chol Calc (NIH): 151 mg/dL — ABNORMAL HIGH (ref 0–99)
Triglycerides: 262 mg/dL — ABNORMAL HIGH (ref 0–149)
VLDL Cholesterol Cal: 48 mg/dL — ABNORMAL HIGH (ref 5–40)

## 2021-01-04 LAB — CBC
Hematocrit: 46 % (ref 37.5–51.0)
Hemoglobin: 15.1 g/dL (ref 13.0–17.7)
MCH: 31.7 pg (ref 26.6–33.0)
MCHC: 32.8 g/dL (ref 31.5–35.7)
MCV: 96 fL (ref 79–97)
Platelets: 249 10*3/uL (ref 150–450)
RBC: 4.77 x10E6/uL (ref 4.14–5.80)
RDW: 13.5 % (ref 11.6–15.4)
WBC: 11.3 10*3/uL — ABNORMAL HIGH (ref 3.4–10.8)

## 2021-01-04 LAB — COMPREHENSIVE METABOLIC PANEL
ALT: 30 IU/L (ref 0–44)
AST: 27 IU/L (ref 0–40)
Albumin/Globulin Ratio: 1.5 (ref 1.2–2.2)
Albumin: 4.2 g/dL (ref 3.7–4.7)
Alkaline Phosphatase: 93 IU/L (ref 44–121)
BUN/Creatinine Ratio: 19 (ref 10–24)
BUN: 19 mg/dL (ref 8–27)
Bilirubin Total: 0.5 mg/dL (ref 0.0–1.2)
CO2: 23 mmol/L (ref 20–29)
Calcium: 9.9 mg/dL (ref 8.6–10.2)
Chloride: 101 mmol/L (ref 96–106)
Creatinine, Ser: 0.99 mg/dL (ref 0.76–1.27)
GFR calc Af Amer: 85 mL/min/{1.73_m2} (ref >59)
GFR calc non Af Amer: 73 mL/min/{1.73_m2} (ref >59)
Globulin, Total: 2.8 g/dL (ref 1.5–4.5)
Glucose: 85 mg/dL (ref 65–99)
Potassium: 4 mmol/L (ref 3.5–5.2)
Sodium: 138 mmol/L (ref 134–144)
Total Protein: 7 g/dL (ref 6.0–8.5)

## 2021-01-04 LAB — HEMOGLOBIN A1C
Est. average glucose Bld gHb Est-mCnc: 111 mg/dL
Hgb A1c MFr Bld: 5.5 % (ref 4.8–5.6)

## 2021-01-04 LAB — CARDIOVASCULAR RISK ASSESSMENT

## 2021-01-05 ENCOUNTER — Other Ambulatory Visit: Payer: Self-pay

## 2021-01-05 ENCOUNTER — Ambulatory Visit (INDEPENDENT_AMBULATORY_CARE_PROVIDER_SITE_OTHER): Payer: Medicare Other | Admitting: Family Medicine

## 2021-01-05 VITALS — BP 122/70 | HR 80 | Temp 97.8°F | Resp 20 | Ht 69.0 in | Wt 196.0 lb

## 2021-01-05 DIAGNOSIS — I10 Essential (primary) hypertension: Secondary | ICD-10-CM | POA: Diagnosis not present

## 2021-01-05 DIAGNOSIS — M791 Myalgia, unspecified site: Secondary | ICD-10-CM

## 2021-01-05 DIAGNOSIS — I70219 Atherosclerosis of native arteries of extremities with intermittent claudication, unspecified extremity: Secondary | ICD-10-CM

## 2021-01-05 DIAGNOSIS — K219 Gastro-esophageal reflux disease without esophagitis: Secondary | ICD-10-CM | POA: Diagnosis not present

## 2021-01-05 DIAGNOSIS — E782 Mixed hyperlipidemia: Secondary | ICD-10-CM

## 2021-01-05 DIAGNOSIS — R252 Cramp and spasm: Secondary | ICD-10-CM

## 2021-01-05 DIAGNOSIS — M545 Low back pain, unspecified: Secondary | ICD-10-CM

## 2021-01-05 DIAGNOSIS — G8929 Other chronic pain: Secondary | ICD-10-CM

## 2021-01-05 DIAGNOSIS — Z6828 Body mass index (BMI) 28.0-28.9, adult: Secondary | ICD-10-CM

## 2021-01-05 MED ORDER — OMEGA-3-ACID ETHYL ESTERS 1 G PO CAPS: 2.0000 g | ORAL_CAPSULE | Freq: Two times a day (BID) | ORAL | 0 refills | Status: DC

## 2021-01-05 MED ORDER — OMEPRAZOLE 40 MG PO CPDR: 40.0000 mg | DELAYED_RELEASE_CAPSULE | Freq: Two times a day (BID) | ORAL | 1 refills | Status: DC

## 2021-01-05 MED ORDER — PRAVASTATIN SODIUM 20 MG PO TABS: ORAL_TABLET | ORAL | 1 refills | Status: DC

## 2021-01-05 NOTE — Patient Instructions (Addendum)
Increase omeprazole 40 one twice a day.  Restart pravastatin 20 mg once daily.  Restart lovaza 1 gm 2 capsules twice a day.

## 2021-01-05 NOTE — Progress Notes (Signed)
Subjective:  Patient ID: William Clay, male    DOB: [birth year]  Age: 80 y.o. MRN: [id number]  Chief Complaint  Patient presents with  . Hyperlipidemia  . Hypertension  . Depression    HPI Hyperlipidemia-Lovaza x 1 month, than too expensive. Held pravastatin due to muscle pain, but did not help.  Hypertension-Amlodipine and losartan GERD-Not controlled with omeprazole 40 mg once daily. It did better when was 20 mg one twice a day.  Depression-Zoloft. Working well.  Myalgias: Persistent. Cramps at night in both legs. Left leg hurts with waling. Severe pain. Seeing back surgeon. Back pain radiates to legs and worsens with ambulation.  Current Outpatient Medications on File Prior to Visit  Medication Sig Dispense Refill  . albuterol (PROVENTIL HFA;VENTOLIN HFA) 108 (90 Base) MCG/ACT inhaler Inhale two puffs every four to six hours as needed for cough or wheeze. 1 Inhaler 1  . amLODipine (NORVASC) 10 MG tablet TAKE 1 TABLET BY MOUTH ONCE DAILY 90 tablet 1  . cetirizine (ZYRTEC) 10 MG tablet Take 1 tablet (10 mg total) by mouth daily. 90 tablet 3  . losartan (COZAAR) 100 MG tablet Take 1 tablet (100 mg total) by mouth daily. 90 tablet 1  . Multiple Vitamins-Minerals (MULTIVITAMIN WITH MINERALS) tablet Take 1 tablet by mouth daily.    . pregabalin (LYRICA) 50 MG capsule Take 50 mg by mouth daily.    . Probiotic Product (RESTORA PO) Take 1 tablet by mouth daily.    . sertraline (ZOLOFT) 100 MG tablet Take 1 tablet (100 mg total) by mouth daily. 90 tablet 3  . Tiotropium Bromide-Olodaterol 2.5-2.5 MCG/ACT AERS Inhale 2 puffs into the lungs daily.     Marland Kitchen albuterol (PROVENTIL) (2.5 MG/3ML) 0.083% nebulizer solution Take 3 mLs (2.5 mg total) by nebulization every 6 (six) hours as needed for wheezing or shortness of breath. (Patient not taking: Reported on 01/05/2021) 150 mL 3   No current facility-administered medications on file prior to visit.   Past Medical History:  Diagnosis Date  .  Atherosclerosis of aorta ([place])   . Bowel obstruction ([place]) 2018  . BPH (benign prostatic hyperplasia)   . COPD (chronic obstructive pulmonary disease) ([place])   . COPD (chronic obstructive pulmonary disease) ([place])   . GERD (gastroesophageal reflux disease)   . Hypertension   . Hypoxemia   . Lumbar stenosis with neurogenic claudication 04/24/2017  . Mixed hyperlipidemia   . Osteoarthritis   . Pneumonia [id number]  . Primary insomnia   . Sleep apnea   . Spontaneous ecchymoses    Past Surgical History:  Procedure Laterality Date  . ABDOMINAL AORTIC ANEURYSM REPAIR    . BACK SURGERY N/A [phone], 2021   Lower back and cervical spine  . BACK SURGERY  1984   upper back  . CATARACT EXTRACTION Left 2017  . CATARACT EXTRACTION Right 2017  . CHOLECYSTECTOMY  2018  . HERNIA REPAIR  2018  . PROSTATECTOMY    . ROTATOR CUFF REPAIR Right 2015  . ROTATOR CUFF REPAIR Left 2017    Family History  Problem Relation Age of Onset  . Heart failure Mother   . Diabetes Mother   . CAD Mother   . Hypertension Mother    Social History   Socioeconomic History  . Marital status: Married    Spouse name: Not on file  . Number of children: 2  . Years of education: Not on file  . Highest education level: Not on file  Occupational History  .  Occupation: retired    Comment: Event organiser  Tobacco Use  . Smoking status: Former Smoker    Packs/day: 2.00    Years: 50.00    Pack years: 100.00    Types: Cigarettes    Quit date: 12/24/2007    Years since quitting: 13.0  . Smokeless tobacco: Never Used  Vaping Use  . Vaping Use: Never used  Substance and Sexual Activity  . Alcohol use: No    Alcohol/week: 0.0 standard drinks  . Drug use: No  . Sexual activity: Not on file  Other Topics Concern  . Not on file  Social History Narrative  . Not on file   Social Determinants of Health   Financial Resource Strain: Not on file  Food Insecurity: No Food Insecurity  .  Worried About Charity fundraiser in the Last Year: Never true  . Ran Out of Food in the Last Year: Never true  Transportation Needs: Not on file  Physical Activity: Not on file  Stress: Not on file  Social Connections: Not on file    Review of Systems  Constitutional: Negative for chills, fatigue and fever.  HENT: Negative for congestion, ear pain and sore throat.   Respiratory: Positive for cough and shortness of breath.   Cardiovascular: Negative for chest pain, palpitations and leg swelling.  Gastrointestinal: Positive for abdominal pain. Negative for constipation, diarrhea, nausea and vomiting.  Genitourinary: Negative for dysuria and urgency.  Musculoskeletal: Positive for arthralgias, back pain and myalgias (leg cramps).  Skin: Positive for wound (left hand).  Neurological: Negative for dizziness, weakness and headaches.  Psychiatric/Behavioral: Negative for dysphoric mood. The patient is not nervous/anxious.      Objective:  BP 122/70   Pulse 80   Temp 97.8 F (36.6 C)   Resp 20   Ht 5\' 9"  (1.753 m)   Wt 196 lb (88.9 kg)   BMI 28.94 kg/m   BP/Weight 01/05/2021 11/30/2020 10/19/2535  Systolic BP 644 034 742  Diastolic BP 70 76 54  Wt. (Lbs) 196 196.4 -  BMI 28.94 29 -    Physical Exam Vitals reviewed.  Constitutional:      Appearance: Normal appearance.  Cardiovascular:     Rate and Rhythm: Normal rate and regular rhythm.     Pulses:          Dorsalis pedis pulses are 1+ on the right side and 1+ on the left side.       Posterior tibial pulses are 1+ on the right side and 1+ on the left side.     Heart sounds: Normal heart sounds.  Pulmonary:     Effort: Pulmonary effort is normal. No respiratory distress.     Breath sounds: Normal breath sounds.  Abdominal:     Palpations: Abdomen is soft.     Tenderness: There is no abdominal tenderness.  Musculoskeletal:        General: Tenderness (lumbar. wearing back brace.) present.     Cervical back: Normal range  of motion.  Neurological:     Mental Status: He is alert and oriented to person, place, and time.  Psychiatric:        Mood and Affect: Mood normal.        Behavior: Behavior normal.     Diabetic Foot Exam - Simple   No data filed      Lab Results  Component Value Date   WBC 11.3 (H) 01/03/2021   HGB 15.1 01/03/2021   HCT 46.0 01/03/2021  PLT 249 01/03/2021   GLUCOSE 85 01/03/2021   CHOL 237 (H) 01/03/2021   TRIG 262 (H) 01/03/2021   HDL 38 (L) 01/03/2021   LDLCALC 151 (H) 01/03/2021   ALT 30 01/03/2021   AST 27 01/03/2021   NA 138 01/03/2021   K 4.0 01/03/2021   CL 101 01/03/2021   CREATININE 0.99 01/03/2021   BUN 19 01/03/2021   CO2 23 01/03/2021   HGBA1C 5.5 01/03/2021      Assessment & Plan:   1. Mixed hyperlipidemia Poorly controlled . Restart pravastatin 20 mg once daily.  Restart lovaza 1 gm 2 capsules twice a day. If still too expensive, will discuss with Sherre Poot, PHD.  - Lipid panel; Future  2. Essential hypertension Well controlled.  No changes to medicines.  - CBC with Differential/Platelet; Future - Comprehensive metabolic panel; Future  3. Extremity atherosclerosis with intermittent claudication (HCC) - VAS [place] ABI WITH/WO TBI  4. Myalgia - TSH; Future - CK; Future   5. GERD Uncontrolled.  Increase omeprazole 40 mg one twice a day.  6. BMI 28  7. Chronic Back Pain Defer treatment to back surgeon.  8. Muscle cramps: concerned this is due to claudication. Check CPK.  Meds ordered this encounter  Medications  . pravastatin (PRAVACHOL) 20 MG tablet    Sig: TAKE 1 TABLET(20 MG) BY MOUTH DAILY    Dispense:  90 tablet    Refill:  1  . omega-3 acid ethyl esters (LOVAZA) 1 g capsule    Sig: Take 2 capsules (2 g total) by mouth 2 (two) times daily.    Dispense:  360 capsule    Refill:  0  . omeprazole (PRILOSEC) 40 MG capsule    Sig: Take 1 capsule (40 mg total) by mouth in the morning and at bedtime.    Dispense:  180  capsule    Refill:  1    Orders Placed This Encounter  Procedures  . CBC with Differential/Platelet  . Comprehensive metabolic panel  . Lipid panel  . TSH  . CK  . VAS [place] ABI WITH/WO TBI    Follow-up: Return in about 3 months (around 04/05/2021) for chronic. needs labs fasting prior to appt. .  An After Visit Summary was printed and given to the patient.  [name], MD [facility] [phone]

## 2021-01-06 ENCOUNTER — Encounter: Payer: Self-pay | Admitting: Family Medicine

## 2021-01-09 ENCOUNTER — Other Ambulatory Visit: Payer: Self-pay | Admitting: Family Medicine

## 2021-01-10 DIAGNOSIS — M5432 Sciatica, left side: Secondary | ICD-10-CM | POA: Diagnosis not present

## 2021-01-10 DIAGNOSIS — M961 Postlaminectomy syndrome, not elsewhere classified: Secondary | ICD-10-CM | POA: Diagnosis not present

## 2021-01-10 DIAGNOSIS — M4316 Spondylolisthesis, lumbar region: Secondary | ICD-10-CM | POA: Diagnosis not present

## 2021-01-10 DIAGNOSIS — M4326 Fusion of spine, lumbar region: Secondary | ICD-10-CM | POA: Diagnosis not present

## 2021-01-16 DIAGNOSIS — I70219 Atherosclerosis of native arteries of extremities with intermittent claudication, unspecified extremity: Secondary | ICD-10-CM | POA: Diagnosis not present

## 2021-01-16 DIAGNOSIS — I739 Peripheral vascular disease, unspecified: Secondary | ICD-10-CM | POA: Diagnosis not present

## 2021-01-17 ENCOUNTER — Other Ambulatory Visit: Payer: Self-pay

## 2021-01-17 DIAGNOSIS — R6889 Other general symptoms and signs: Secondary | ICD-10-CM

## 2021-01-18 ENCOUNTER — Telehealth: Payer: Self-pay

## 2021-01-18 NOTE — Progress Notes (Signed)
Chronic Care Management Pharmacy Assistant   Name: William Clay  MRN: [id number] DOB: 11-12-1941  Reason for Encounter:  General Adherence Call  Patient Questions:  1.  Have you seen any other providers since your last visit? Yes, 01/02/21- PCP  2.  Any changes in your medicines or health?  Increase omeprazole 40 one twice a day.  Restart pravastatin 20 mg once daily.  Restart lovaza 1 gm 2 capsules twice a day Patient stated he is waiting on approval for a stimulator for his back, and he will have future appointment with vascular surgeon.      PCP : [name], MD  Allergies:   Allergies  Allergen Reactions  . Cefadroxil Shortness Of Breath  . Lisinopril Other (See Comments)    Dyspnea Dyspnea   . Simvastatin Other (See Comments)    Muscle Cramps Muscle Cramps   . Amoxicillin     Mouth sores   . Gabapentin   . Metoprolol Other (See Comments)    Leg cramps  . Potassium Clavulanate [Clavulanic Acid]     diarrhea    Medications: Outpatient Encounter Medications as of 01/18/2021  Medication Sig  . albuterol (PROVENTIL HFA;VENTOLIN HFA) 108 (90 Base) MCG/ACT inhaler Inhale two puffs every four to six hours as needed for cough or wheeze.  Marland Kitchen amLODipine (NORVASC) 10 MG tablet TAKE 1 TABLET BY MOUTH ONCE DAILY  . cetirizine (ZYRTEC) 10 MG tablet Take 1 tablet (10 mg total) by mouth daily.  Marland Kitchen losartan (COZAAR) 100 MG tablet Take 1 tablet (100 mg total) by mouth daily.  . Multiple Vitamins-Minerals (MULTIVITAMIN WITH MINERALS) tablet Take 1 tablet by mouth daily.  Marland Kitchen omega-3 acid ethyl esters (LOVAZA) 1 g capsule Take 2 capsules (2 g total) by mouth 2 (two) times daily.  Marland Kitchen omeprazole (PRILOSEC) 40 MG capsule Take 1 capsule (40 mg total) by mouth in the morning and at bedtime.  . pravastatin (PRAVACHOL) 20 MG tablet TAKE 1 TABLET(20 MG) BY MOUTH DAILY  . pregabalin (LYRICA) 50 MG capsule Take 50 mg by mouth daily.  . Probiotic Product (RESTORA PO) Take 1 tablet by mouth  daily.  . sertraline (ZOLOFT) 100 MG tablet Take 1 tablet (100 mg total) by mouth daily.  . Tiotropium Bromide-Olodaterol 2.5-2.5 MCG/ACT AERS Inhale 2 puffs into the lungs daily.    No facility-administered encounter medications on file as of 01/18/2021.    Current Diagnosis: Patient Active Problem List   Diagnosis Date Noted  . Visual changes 11/30/2020  . Impaired glucose tolerance 10/15/2020  . Drug-induced myopathy 10/15/2020  . Class 1 obesity due to excess calories with serious comorbidity and body mass index (BMI) of 30.0 to 30.9 in adult 05/26/2020  . Chronic lumbar radiculopathy 05/26/2020  . Gastroesophageal reflux disease 04/12/2020  . S/P lumbar fusion 04/12/2020  . Sensorineural hearing loss (SNHL) 04/12/2020  . OSA on CPAP 04/12/2020  . Medicare annual wellness visit, subsequent 02/29/2020  . Mild recurrent major depression ([place]) 02/25/2020  . Mixed hyperlipidemia 02/25/2020  . Chronic respiratory failure with hypoxia ([place]) 09/02/2017  . Oxygen dependent 04/21/2017  . Coronary artery disease involving native coronary artery of native heart 09/23/2016  . Essential hypertension, benign 03/17/2015  . Chronic obstructive pulmonary disease ([place]) 03/15/2015   Patient stated his reflux has improved with the increase of Omeprazole.  He stated he is not able to afford the Omega3-fish oil that Dr. Tobie Poet wanted him on, he stated it would be over $200.00.    Patient  stated he has leg cramps at night, this is something he has had for quite a while, some nights he gets up 7-8 times a night.    Patient stated that yesterday he was out in the cold for about 28min, putting some air in a tire, he was feeling some light headed symptoms so he pulled out his pulse ox meter that he keeps with him, his O2 level was 91 but his pulse was 133.  Patient stated he can't take the cold.    Patient stated his activity is down due to back pain and not tolerating the cold, he gets out in the yard at  times to pick up the gum tree balls.  He stated he tries to watch his diet, for breakfast today he had omelet and toast with jam, but for lunch he only had 3 oreo cookies     Follow-Up:  Pharmacist Review  Donette Larry, CPP notified  Clarita Leber, [place] Pharmacist Assistant [phone]

## 2021-01-25 ENCOUNTER — Other Ambulatory Visit: Payer: Self-pay | Admitting: Family Medicine

## 2021-01-29 ENCOUNTER — Other Ambulatory Visit: Payer: Self-pay

## 2021-01-29 ENCOUNTER — Other Ambulatory Visit: Payer: Self-pay | Admitting: Family Medicine

## 2021-01-29 MED ORDER — LOSARTAN POTASSIUM 50 MG PO TABS: 50.0000 mg | ORAL_TABLET | Freq: Two times a day (BID) | ORAL | 1 refills | Status: DC

## 2021-01-30 DIAGNOSIS — Z87891 Personal history of nicotine dependence: Secondary | ICD-10-CM | POA: Diagnosis not present

## 2021-01-30 DIAGNOSIS — R6889 Other general symptoms and signs: Secondary | ICD-10-CM | POA: Insufficient documentation

## 2021-01-30 DIAGNOSIS — I739 Peripheral vascular disease, unspecified: Secondary | ICD-10-CM | POA: Diagnosis not present

## 2021-01-30 DIAGNOSIS — R0989 Other specified symptoms and signs involving the circulatory and respiratory systems: Secondary | ICD-10-CM | POA: Diagnosis not present

## 2021-01-31 ENCOUNTER — Other Ambulatory Visit: Payer: Self-pay | Admitting: Family Medicine

## 2021-02-07 ENCOUNTER — Other Ambulatory Visit: Payer: Self-pay

## 2021-02-07 ENCOUNTER — Telehealth: Payer: Self-pay

## 2021-02-07 DIAGNOSIS — L821 Other seborrheic keratosis: Secondary | ICD-10-CM | POA: Diagnosis not present

## 2021-02-07 DIAGNOSIS — L570 Actinic keratosis: Secondary | ICD-10-CM | POA: Diagnosis not present

## 2021-02-07 DIAGNOSIS — L853 Xerosis cutis: Secondary | ICD-10-CM | POA: Diagnosis not present

## 2021-02-07 NOTE — Progress Notes (Signed)
    Chronic Care Management Pharmacy Assistant   Name: William Clay  MRN: [id number] DOB: 1941-07-09  Reason for Encounter: Medication Review for losartan 100mg  refill  PCP : [name], MD  Allergies:   Allergies  Allergen Reactions  . Cefadroxil Shortness Of Breath  . Lisinopril Other (See Comments)    Dyspnea Dyspnea   . Simvastatin Other (See Comments)    Muscle Cramps Muscle Cramps   . Amoxicillin     Mouth sores   . Gabapentin   . Metoprolol Other (See Comments)    Leg cramps  . Potassium Clavulanate [Clavulanic Acid]     diarrhea    Medications: Outpatient Encounter Medications as of 02/07/2021  Medication Sig  . albuterol (PROVENTIL HFA;VENTOLIN HFA) 108 (90 Base) MCG/ACT inhaler Inhale two puffs every four to six hours as needed for cough or wheeze.  Marland Kitchen amLODipine (NORVASC) 10 MG tablet TAKE 1 TABLET BY MOUTH ONCE DAILY  . cetirizine (ZYRTEC) 10 MG tablet Take 1 tablet (10 mg total) by mouth daily.  Marland Kitchen losartan (COZAAR) 50 MG tablet Take 1 tablet (50 mg total) by mouth 2 (two) times daily.  . Multiple Vitamins-Minerals (MULTIVITAMIN WITH MINERALS) tablet Take 1 tablet by mouth daily.  Marland Kitchen omega-3 acid ethyl esters (LOVAZA) 1 g capsule Take 2 capsules (2 g total) by mouth 2 (two) times daily.  Marland Kitchen omeprazole (PRILOSEC) 40 MG capsule Take 1 capsule (40 mg total) by mouth in the morning and at bedtime.  . pravastatin (PRAVACHOL) 20 MG tablet TAKE 1 TABLET(20 MG) BY MOUTH DAILY  . pregabalin (LYRICA) 50 MG capsule Take 50 mg by mouth daily.  . Probiotic Product (RESTORA PO) Take 1 tablet by mouth daily.  . sertraline (ZOLOFT) 100 MG tablet Take 1 tablet (100 mg total) by mouth daily.  . Tiotropium Bromide-Olodaterol 2.5-2.5 MCG/ACT AERS Inhale 2 puffs into the lungs daily.    No facility-administered encounter medications on file as of 02/07/2021.    Current Diagnosis: Patient Active Problem List   Diagnosis Date Noted  . Visual changes 11/30/2020  . Impaired  glucose tolerance 10/15/2020  . Drug-induced myopathy 10/15/2020  . Class 1 obesity due to excess calories with serious comorbidity and body mass index (BMI) of 30.0 to 30.9 in adult 05/26/2020  . Chronic lumbar radiculopathy 05/26/2020  . Gastroesophageal reflux disease 04/12/2020  . S/P lumbar fusion 04/12/2020  . Sensorineural hearing loss (SNHL) 04/12/2020  . OSA on CPAP 04/12/2020  . Medicare annual wellness visit, subsequent 02/29/2020  . Mild recurrent major depression ([place]) 02/25/2020  . Mixed hyperlipidemia 02/25/2020  . Chronic respiratory failure with hypoxia ([place]) 09/02/2017  . Oxygen dependent 04/21/2017  . Coronary artery disease involving native coronary artery of native heart 09/23/2016  . Essential hypertension, benign 03/17/2015  . Chronic obstructive pulmonary disease ([place]) 03/15/2015    Patient called and requested a new script for his losartan 100mg , be sent to [facility].  Per patient his expired in December 2021.  I notified Donette Larry, CPP, that a refill was needed for the patient.    Per chart review the patient had an initial consult with vascular surgery on 01/30/21  Per adherence gap Total gaps-all measures 2 AWV not performed  Star measures-0 Recent A1C  5.7  Follow-Up:  Pharmacist Review  Donette Larry, CPP notified  Clarita Leber, [place] Pharmacist Assistant [phone]

## 2021-02-08 DIAGNOSIS — M4326 Fusion of spine, lumbar region: Secondary | ICD-10-CM | POA: Diagnosis not present

## 2021-02-08 DIAGNOSIS — M4316 Spondylolisthesis, lumbar region: Secondary | ICD-10-CM | POA: Diagnosis not present

## 2021-02-08 DIAGNOSIS — M961 Postlaminectomy syndrome, not elsewhere classified: Secondary | ICD-10-CM | POA: Diagnosis not present

## 2021-02-08 DIAGNOSIS — M5432 Sciatica, left side: Secondary | ICD-10-CM | POA: Diagnosis not present

## 2021-02-08 DIAGNOSIS — M546 Pain in thoracic spine: Secondary | ICD-10-CM | POA: Diagnosis not present

## 2021-02-13 ENCOUNTER — Other Ambulatory Visit: Payer: Self-pay | Admitting: Family Medicine

## 2021-02-13 DIAGNOSIS — M4804 Spinal stenosis, thoracic region: Secondary | ICD-10-CM | POA: Diagnosis not present

## 2021-02-13 DIAGNOSIS — M5125 Other intervertebral disc displacement, thoracolumbar region: Secondary | ICD-10-CM | POA: Diagnosis not present

## 2021-02-13 DIAGNOSIS — M5124 Other intervertebral disc displacement, thoracic region: Secondary | ICD-10-CM | POA: Diagnosis not present

## 2021-02-13 DIAGNOSIS — M47814 Spondylosis without myelopathy or radiculopathy, thoracic region: Secondary | ICD-10-CM | POA: Diagnosis not present

## 2021-02-13 DIAGNOSIS — M4316 Spondylolisthesis, lumbar region: Secondary | ICD-10-CM | POA: Diagnosis not present

## 2021-02-19 ENCOUNTER — Other Ambulatory Visit: Payer: Self-pay | Admitting: Family Medicine

## 2021-02-19 NOTE — Telephone Encounter (Signed)
Pharmacy and pt requesting medication.   William Clay 02/19/21 4:19 PM

## 2021-02-28 DIAGNOSIS — E7849 Other hyperlipidemia: Secondary | ICD-10-CM | POA: Diagnosis not present

## 2021-02-28 DIAGNOSIS — I77811 Abdominal aortic ectasia: Secondary | ICD-10-CM | POA: Diagnosis not present

## 2021-02-28 DIAGNOSIS — R000 Tachycardia, unspecified: Secondary | ICD-10-CM | POA: Diagnosis not present

## 2021-02-28 DIAGNOSIS — R0789 Other chest pain: Secondary | ICD-10-CM | POA: Diagnosis not present

## 2021-02-28 DIAGNOSIS — Z79899 Other long term (current) drug therapy: Secondary | ICD-10-CM | POA: Diagnosis not present

## 2021-02-28 DIAGNOSIS — I708 Atherosclerosis of other arteries: Secondary | ICD-10-CM | POA: Diagnosis not present

## 2021-02-28 DIAGNOSIS — I70213 Atherosclerosis of native arteries of extremities with intermittent claudication, bilateral legs: Secondary | ICD-10-CM | POA: Diagnosis not present

## 2021-02-28 DIAGNOSIS — R9431 Abnormal electrocardiogram [ECG] [EKG]: Secondary | ICD-10-CM | POA: Diagnosis not present

## 2021-02-28 DIAGNOSIS — R002 Palpitations: Secondary | ICD-10-CM | POA: Diagnosis not present

## 2021-02-28 DIAGNOSIS — R0600 Dyspnea, unspecified: Secondary | ICD-10-CM | POA: Diagnosis not present

## 2021-02-28 DIAGNOSIS — J449 Chronic obstructive pulmonary disease, unspecified: Secondary | ICD-10-CM | POA: Diagnosis not present

## 2021-02-28 DIAGNOSIS — R0609 Other forms of dyspnea: Secondary | ICD-10-CM | POA: Diagnosis not present

## 2021-02-28 DIAGNOSIS — E785 Hyperlipidemia, unspecified: Secondary | ICD-10-CM | POA: Diagnosis not present

## 2021-02-28 DIAGNOSIS — Z87891 Personal history of nicotine dependence: Secondary | ICD-10-CM | POA: Diagnosis not present

## 2021-02-28 DIAGNOSIS — I700 Atherosclerosis of aorta: Secondary | ICD-10-CM | POA: Diagnosis not present

## 2021-02-28 DIAGNOSIS — I10 Essential (primary) hypertension: Secondary | ICD-10-CM | POA: Diagnosis not present

## 2021-03-12 ENCOUNTER — Telehealth: Payer: Self-pay

## 2021-03-12 NOTE — Progress Notes (Signed)
    Chronic Care Management Pharmacy Assistant   Name: William Clay  MRN: [id number] DOB: 21-Aug-1941   Reason for Encounter: Disease State for general adherence    Recent office visits:  02/09/21-canceled surgery  Recent consult visits:  01/30/21-  Vascular consult  Hospital visits:  09/10/20-ED CELLULITIS  Medications: Outpatient Encounter Medications as of 03/12/2021  Medication Sig  . losartan (COZAAR) 100 MG tablet TAKE 1 TABLET BY MOUTH ONCE DAILY  . albuterol (PROVENTIL HFA;VENTOLIN HFA) 108 (90 Base) MCG/ACT inhaler Inhale two puffs every four to six hours as needed for cough or wheeze.  Marland Kitchen amLODipine (NORVASC) 10 MG tablet TAKE 1 TABLET BY MOUTH ONCE DAILY  . cetirizine (ZYRTEC) 10 MG tablet Take 1 tablet (10 mg total) by mouth daily.  . Multiple Vitamins-Minerals (MULTIVITAMIN WITH MINERALS) tablet Take 1 tablet by mouth daily.  Marland Kitchen omega-3 acid ethyl esters (LOVAZA) 1 g capsule Take 2 capsules (2 g total) by mouth 2 (two) times daily.  Marland Kitchen omeprazole (PRILOSEC) 40 MG capsule Take 1 capsule (40 mg total) by mouth in the morning and at bedtime.  . pravastatin (PRAVACHOL) 20 MG tablet TAKE 1 TABLET(20 MG) BY MOUTH DAILY  . pregabalin (LYRICA) 50 MG capsule Take 50 mg by mouth daily.  . Probiotic Product (RESTORA PO) Take 1 tablet by mouth daily.  . sertraline (ZOLOFT) 100 MG tablet TAKE 1 TABLET BY MOUTH ONCE DAILY  . Tiotropium Bromide-Olodaterol 2.5-2.5 MCG/ACT AERS Inhale 2 puffs into the lungs daily.    No facility-administered encounter medications on file as of 03/12/2021.   Spoke to patient and he stated his surgery was cancelled due his oxygen level was down and his pulse rate was high.    The patient state he is currently on a heart monitor and is scheduled to be removed this Thursday.  He stated his activity level is down due to being sob with exertion.   He is taking his medication as directed, and has no issues with his medications at this time.  He stated he is  hoping to get this resolved soon, so he can get this groin blockage cleared up and get his back stimulator.   Clarita Leber, [place] Pharmacist Assistant [phone]

## 2021-03-15 DIAGNOSIS — G894 Chronic pain syndrome: Secondary | ICD-10-CM | POA: Diagnosis not present

## 2021-03-21 DIAGNOSIS — I471 Supraventricular tachycardia: Secondary | ICD-10-CM | POA: Diagnosis not present

## 2021-03-30 DIAGNOSIS — I471 Supraventricular tachycardia: Secondary | ICD-10-CM | POA: Diagnosis not present

## 2021-04-02 DIAGNOSIS — G894 Chronic pain syndrome: Secondary | ICD-10-CM | POA: Diagnosis not present

## 2021-04-09 DIAGNOSIS — I739 Peripheral vascular disease, unspecified: Secondary | ICD-10-CM | POA: Insufficient documentation

## 2021-04-09 DIAGNOSIS — Z20822 Contact with and (suspected) exposure to covid-19: Secondary | ICD-10-CM | POA: Diagnosis not present

## 2021-04-13 ENCOUNTER — Other Ambulatory Visit: Payer: Self-pay

## 2021-04-13 ENCOUNTER — Ambulatory Visit: Payer: Medicare Other

## 2021-04-13 DIAGNOSIS — E782 Mixed hyperlipidemia: Secondary | ICD-10-CM | POA: Diagnosis not present

## 2021-04-13 DIAGNOSIS — R7301 Impaired fasting glucose: Secondary | ICD-10-CM

## 2021-04-13 DIAGNOSIS — I10 Essential (primary) hypertension: Secondary | ICD-10-CM | POA: Diagnosis not present

## 2021-04-13 DIAGNOSIS — R7302 Impaired glucose tolerance (oral): Secondary | ICD-10-CM

## 2021-04-14 LAB — COMPREHENSIVE METABOLIC PANEL
ALT: 24 IU/L (ref 0–44)
AST: 21 IU/L (ref 0–40)
Albumin/Globulin Ratio: 1.7 (ref 1.2–2.2)
Albumin: 4.3 g/dL (ref 3.7–4.7)
Alkaline Phosphatase: 86 IU/L (ref 44–121)
BUN/Creatinine Ratio: 17 (ref 10–24)
BUN: 14 mg/dL (ref 8–27)
Bilirubin Total: 0.5 mg/dL (ref 0.0–1.2)
CO2: 20 mmol/L (ref 20–29)
Calcium: 9.9 mg/dL (ref 8.6–10.2)
Chloride: 103 mmol/L (ref 96–106)
Creatinine, Ser: 0.81 mg/dL (ref 0.76–1.27)
Globulin, Total: 2.6 g/dL (ref 1.5–4.5)
Glucose: 113 mg/dL — ABNORMAL HIGH (ref 65–99)
Potassium: 3.7 mmol/L (ref 3.5–5.2)
Sodium: 140 mmol/L (ref 134–144)
Total Protein: 6.9 g/dL (ref 6.0–8.5)
eGFR: 91 mL/min/{1.73_m2} (ref >59)

## 2021-04-14 LAB — CBC
Hematocrit: 43.8 % (ref 37.5–51.0)
Hemoglobin: 14.8 g/dL (ref 13.0–17.7)
MCH: 32.3 pg (ref 26.6–33.0)
MCHC: 33.8 g/dL (ref 31.5–35.7)
MCV: 96 fL (ref 79–97)
Platelets: 245 10*3/uL (ref 150–450)
RBC: 4.58 x10E6/uL (ref 4.14–5.80)
RDW: 12.9 % (ref 11.6–15.4)
WBC: 14.9 10*3/uL — ABNORMAL HIGH (ref 3.4–10.8)

## 2021-04-14 LAB — LIPID PANEL
Chol/HDL Ratio: 3.8 ratio (ref 0.0–5.0)
Cholesterol, Total: 169 mg/dL (ref 100–199)
HDL: 45 mg/dL (ref >39)
LDL Chol Calc (NIH): 100 mg/dL — ABNORMAL HIGH (ref 0–99)
Triglycerides: 132 mg/dL (ref 0–149)
VLDL Cholesterol Cal: 24 mg/dL (ref 5–40)

## 2021-04-14 LAB — HEMOGLOBIN A1C
Est. average glucose Bld gHb Est-mCnc: 114 mg/dL
Hgb A1c MFr Bld: 5.6 % (ref 4.8–5.6)

## 2021-04-14 LAB — TSH: TSH: 1.34 u[IU]/mL (ref 0.450–4.500)

## 2021-04-14 LAB — CARDIOVASCULAR RISK ASSESSMENT

## 2021-04-16 ENCOUNTER — Ambulatory Visit: Payer: Medicare Other

## 2021-04-17 ENCOUNTER — Encounter: Payer: Self-pay | Admitting: Family Medicine

## 2021-04-18 ENCOUNTER — Other Ambulatory Visit: Payer: Self-pay

## 2021-04-18 ENCOUNTER — Ambulatory Visit (INDEPENDENT_AMBULATORY_CARE_PROVIDER_SITE_OTHER): Payer: Medicare Other | Admitting: Family Medicine

## 2021-04-18 ENCOUNTER — Ambulatory Visit (INDEPENDENT_AMBULATORY_CARE_PROVIDER_SITE_OTHER): Payer: Medicare Other

## 2021-04-18 VITALS — BP 134/70 | HR 84 | Temp 97.4°F | Resp 18 | Ht 69.0 in | Wt 193.4 lb

## 2021-04-18 DIAGNOSIS — J449 Chronic obstructive pulmonary disease, unspecified: Secondary | ICD-10-CM | POA: Diagnosis not present

## 2021-04-18 DIAGNOSIS — I700 Atherosclerosis of aorta: Secondary | ICD-10-CM

## 2021-04-18 DIAGNOSIS — M545 Low back pain, unspecified: Secondary | ICD-10-CM | POA: Diagnosis not present

## 2021-04-18 DIAGNOSIS — R079 Chest pain, unspecified: Secondary | ICD-10-CM | POA: Diagnosis not present

## 2021-04-18 DIAGNOSIS — Z9981 Dependence on supplemental oxygen: Secondary | ICD-10-CM | POA: Diagnosis not present

## 2021-04-18 DIAGNOSIS — I2510 Atherosclerotic heart disease of native coronary artery without angina pectoris: Secondary | ICD-10-CM | POA: Diagnosis not present

## 2021-04-18 DIAGNOSIS — J9611 Chronic respiratory failure with hypoxia: Secondary | ICD-10-CM | POA: Diagnosis not present

## 2021-04-18 DIAGNOSIS — Z23 Encounter for immunization: Secondary | ICD-10-CM

## 2021-04-18 DIAGNOSIS — E782 Mixed hyperlipidemia: Secondary | ICD-10-CM | POA: Diagnosis not present

## 2021-04-18 DIAGNOSIS — K219 Gastro-esophageal reflux disease without esophagitis: Secondary | ICD-10-CM

## 2021-04-18 DIAGNOSIS — I70219 Atherosclerosis of native arteries of extremities with intermittent claudication, unspecified extremity: Secondary | ICD-10-CM

## 2021-04-18 DIAGNOSIS — R7302 Impaired glucose tolerance (oral): Secondary | ICD-10-CM

## 2021-04-18 DIAGNOSIS — F330 Major depressive disorder, recurrent, mild: Secondary | ICD-10-CM

## 2021-04-18 DIAGNOSIS — Z6828 Body mass index (BMI) 28.0-28.9, adult: Secondary | ICD-10-CM | POA: Diagnosis not present

## 2021-04-18 DIAGNOSIS — E785 Hyperlipidemia, unspecified: Secondary | ICD-10-CM | POA: Diagnosis not present

## 2021-04-18 DIAGNOSIS — G8929 Other chronic pain: Secondary | ICD-10-CM

## 2021-04-18 DIAGNOSIS — I471 Supraventricular tachycardia: Secondary | ICD-10-CM | POA: Diagnosis not present

## 2021-04-18 DIAGNOSIS — Z87891 Personal history of nicotine dependence: Secondary | ICD-10-CM | POA: Diagnosis not present

## 2021-04-18 DIAGNOSIS — I10 Essential (primary) hypertension: Secondary | ICD-10-CM

## 2021-04-18 DIAGNOSIS — R002 Palpitations: Secondary | ICD-10-CM | POA: Diagnosis not present

## 2021-04-18 DIAGNOSIS — D72828 Other elevated white blood cell count: Secondary | ICD-10-CM

## 2021-04-18 NOTE — Progress Notes (Signed)
   Covid-19 Vaccination Clinic  Name:  William Clay    MRN: [id number] DOB: 02/22/1941  04/18/2021  William Clay was observed post Covid-19 immunization for 15 minutes without incident. He was provided with Vaccine Information Sheet and instruction to access the V-Safe system.   William Clay was instructed to call 911 with any severe reactions post vaccine: Marland Kitchen Difficulty breathing  . Swelling of face and throat  . A fast heartbeat  . A bad rash all over body  . Dizziness and weakness

## 2021-04-18 NOTE — Progress Notes (Addendum)
 Subjective:  Patient ID: William Clay, male    DOB: 12/04/1941  Age: 79 y.o. MRN: [id number]  Chief Complaint  Patient presents with  . COPD  . Obstructive Sleep Apnea  . impaired fasting glucose     HPI This 79-year-old white male who presents for follow-up of COPD, OSA, impaired glucose, hypertension, hyperlipidemia, and chronic respiratory failure with hypoxia.  COPD with chronic respiratory failure with hypoxia: Currently on 2 L of oxygen.  Patient is also complicated by OSA.  He does wear his CPAP.  Barriers include Stiolto 2 puffs daily and albuterol 2 puffs every 4-6 hours as needed.  Hyperlipidemia and hypertension: Currently on pravastatin for his cholesterol.  Currently on losartan 100 mg once daily and amlodipine 10 mg once daily for blood pressure.  Chronic back pain: Currently on Lyrica 50 mg daily.  He has seen the back surgeon/pain clinic: They did a trial spine stimulator which worked great.  Dr. Davis is planning put a permanent stimulator in, but it has been postponed due to upcoming surgery for pvd. He saw Dr. Chandra, who performed an angiogram that showed femoral arterial narrowing requiring an endarterectomy.  Patient is a follow-up with cardiology.  He had a heart monitor which showed per the patient his heart rate was 30-140.  This has not been discussed with the cardiologist and the anticipates discussion this morning. Last stress test appears to have been 2017. Echocardiogram done in March showed preserved left ventricular ejection fraction with some aortic valve thickening but no stenosis and no other valvular problems noted. No evidence of pulmonary hypertension suggested. Holter monitor showed some abnormalities, but none that were convincing for causing dizziness.  The nonsustained atrial tachycardia was not represented in the symptomatic events. Dr. Fitzgerald per patient note read the day after our visit, recommended nuclear stress test.  Risk factors for  coronary artery disease include advanced age, hypertension, hyperlipidemia, and family history positive for coronary disease at advanced age-remote history of smoking.     Current Outpatient Medications on File Prior to Visit  Medication Sig Dispense Refill  . aspirin 81 MG EC tablet Take 1 tablet by mouth daily.    . chlorpheniramine (CHLOR-TRIMETON) 4 MG tablet Take 4 mg by mouth 2 (two) times daily as needed for allergies.    . famotidine (PEPCID) 40 MG tablet Take 1 tablet by mouth at bedtime.    . valsartan (DIOVAN) 160 MG tablet Take 0.5 tablets by mouth at bedtime.    . albuterol (PROVENTIL HFA;VENTOLIN HFA) 108 (90 Base) MCG/ACT inhaler Inhale two puffs every four to six hours as needed for cough or wheeze. 1 Inhaler 1  . amLODipine (NORVASC) 10 MG tablet TAKE 1 TABLET BY MOUTH ONCE DAILY 90 tablet 1  . glucosamine-chondroitin 500-400 MG tablet Take 1 tablet by mouth 3 (three) times daily.    . Multiple Vitamins-Minerals (MULTIVITAMIN WITH MINERALS) tablet Take 1 tablet by mouth daily.    . omega-3 acid ethyl esters (LOVAZA) 1 g capsule Take 2 capsules (2 g total) by mouth 2 (two) times daily. 360 capsule 0  . omeprazole (PRILOSEC) 40 MG capsule Take 1 capsule (40 mg total) by mouth in the morning and at bedtime. 180 capsule 1  . pravastatin (PRAVACHOL) 20 MG tablet TAKE 1 TABLET(20 MG) BY MOUTH DAILY 90 tablet 1  . pregabalin (LYRICA) 50 MG capsule Take 50 mg by mouth daily.    . Probiotic Product (RESTORA PO) Take 1 tablet by mouth daily.    .   sertraline (ZOLOFT) 100 MG tablet TAKE 1 TABLET BY MOUTH ONCE DAILY 90 tablet 3   Tiotropium Bromide-Olodaterol 2.5-2.5 MCG/ACT AERS Inhale 2 puffs into the lungs daily.      No current facility-administered medications on file prior to visit.   Past Medical History:  Diagnosis Date   Atherosclerosis of aorta (HCC)    Bowel obstruction ([place]) 2018   BPH (benign prostatic hyperplasia)    COPD (chronic obstructive pulmonary disease) (HCC)    COPD  (chronic obstructive pulmonary disease) (HCC)    GERD (gastroesophageal reflux disease)    Hypertension    Hypoxemia    Lumbar stenosis with neurogenic claudication 04/24/2017   Mixed hyperlipidemia    Osteoarthritis    Pneumonia 2016,2017   Primary insomnia    Sleep apnea    Spontaneous ecchymoses    Past Surgical History:  Procedure Laterality Date   ABDOMINAL AORTIC ANEURYSM REPAIR     BACK SURGERY N/A [phone], 2021   Lower back and cervical spine   BACK SURGERY  1984   upper back   CATARACT EXTRACTION Left 2017   CATARACT EXTRACTION Right 2017   CHOLECYSTECTOMY  2018   HERNIA REPAIR  2018   PROSTATECTOMY     ROTATOR CUFF REPAIR Right 2015   ROTATOR CUFF REPAIR Left 2017    Family History  Problem Relation Age of Onset   Heart failure Mother    Diabetes Mother    CAD Mother    Hypertension Mother    Social History   Socioeconomic History   Marital status: Married    Spouse name: Not on file   Number of children: 2   Years of education: Not on file   Highest education level: Not on file  Occupational History   Occupation: retired    Comment: Event organiser  Tobacco Use   Smoking status: Former Smoker    Packs/day: 2.00    Years: 50.00    Pack years: 100.00    Types: Cigarettes    Quit date: 12/24/2007    Years since quitting: 13.3   Smokeless tobacco: Never Used  Scientific laboratory technician Use: Never used  Substance and Sexual Activity   Alcohol use: No    Alcohol/week: 0.0 standard drinks   Drug use: No   Sexual activity: Not on file  Other Topics Concern   Not on file  Social History Narrative   Not on file   Social Determinants of Health   Financial Resource Strain: Not on file  Food Insecurity: No Food Insecurity   Worried About Charity fundraiser in the Last Year: Never true   Arboriculturist in the Last Year: Never true  Transportation Needs: Not on file  Physical Activity: Not on file  Stress: Not on file  Social  Connections: Not on file    Review of Systems  Constitutional: Negative for chills, fatigue and fever.  HENT: Negative for congestion, ear pain and sore throat.   Respiratory: Positive for shortness of breath (Baseline). Negative for cough.   Cardiovascular: Negative for chest pain.  Gastrointestinal: Negative for abdominal pain, constipation, diarrhea, nausea and vomiting.  Endocrine: Positive for polydipsia. Negative for polyphagia and polyuria.  Genitourinary: Negative for dysuria and frequency.  Musculoskeletal: Negative for arthralgias and myalgias.  Neurological: Positive for headaches (Occasionally). Negative for dizziness.  Psychiatric/Behavioral: Negative for dysphoric mood.       No dysphoria     Objective:  BP 134/70   Pulse 84  Temp (!) 97.4 F (36.3 C)   Resp 18   Ht 5\' 9"  (1.753 m)   Wt 193 lb 6.4 oz (87.7 kg)   BMI 28.56 kg/m   BP/Weight 04/18/2021 01/05/2021 40/08/8118  Systolic BP 147 829 562  Diastolic BP 70 70 76  Wt. (Lbs) 193.4 196 196.4  BMI 28.56 28.94 29    Physical Exam Vitals reviewed.  Constitutional:      Appearance: Normal appearance.  Neck:     Vascular: No carotid bruit.  Cardiovascular:     Rate and Rhythm: Normal rate and regular rhythm.     Pulses: Normal pulses.     Heart sounds: Normal heart sounds.  Pulmonary:     Effort: Pulmonary effort is normal.     Breath sounds: Normal breath sounds. No wheezing, rhonchi or rales.  Abdominal:     General: Bowel sounds are normal.     Palpations: Abdomen is soft.     Tenderness: There is no abdominal tenderness.  Musculoskeletal:        General: Tenderness (lumbar) present.  Neurological:     Mental Status: He is alert.  Psychiatric:        Mood and Affect: Mood normal.        Behavior: Behavior normal.     Diabetic Foot Exam - Simple   No data filed      Lab Results  Component Value Date   WBC 14.9 (H) 04/13/2021   HGB 14.8 04/13/2021   HCT 43.8 04/13/2021   PLT 245  04/13/2021   GLUCOSE 113 (H) 04/13/2021   CHOL 169 04/13/2021   TRIG 132 04/13/2021   HDL 45 04/13/2021   LDLCALC 100 (H) 04/13/2021   ALT 24 04/13/2021   AST 21 04/13/2021   NA 140 04/13/2021   K 3.7 04/13/2021   CL 103 04/13/2021   CREATININE 0.81 04/13/2021   BUN 14 04/13/2021   CO2 20 04/13/2021   TSH 1.340 04/13/2021   HGBA1C 5.6 04/13/2021      Assessment & Plan:   1. Mixed hyperlipidemia Greatly improved. In light of peripheral vascular disease however I do recommend increasing pravastatin to 40 mg once daily.  Continue Lovaza 1 g 2 capsules twice daily.  2. Essential hypertension Well controlled.  No changes to medicines.  Continue to work on eating a healthy diet. Unable to exercise.  Labs reviewed with patient  3. Impaired glucose tolerance At goal.   4. Extremity atherosclerosis with intermittent claudication ([place]) Defer to Dr. Lester Orient. Vascular surgery planned.   5. Chronic bilateral low back pain without sciatica Defer to Dr. Rosana Hoes.  I am pleased that the spine stimulator trial went well.  Patient has been in a lot of pain for many months.  6. Gastroesophageal reflux disease without esophagitis Continue current medication.  7. Coronary artery disease involving native coronary artery of native heart without angina pectoris Defer management to cardiology.  Doing great getting an ischemic work-up prior to surgery as advantageous.  8. Chronic respiratory failure with hypoxia (HCC) Continue oxygen at 2 L  9. Atherosclerosis of aorta (HCC) Increase pravastatin as stated above.  10. Oxygen dependent Continue O2 at 2 L  11. BMI 28.0-28.9,adult Continue eating healthy.  Unable to exercise.   12. Mild recurrent major depression ([place]) Continue Zoloft.  Well-controlled  13. Leukocytosis. Refer to hematology.   Follow-up: Return in about 3 months (around 07/18/2021) for fasting.  An After Visit Summary was printed and given to the patient.  [name], MD [facility] [phone]

## 2021-04-18 NOTE — Progress Notes (Incomplete Revision)
Subjective:  Patient ID: William Clay, male    DOB: February 02, 1941  Age: 80 y.o. MRN: [id number]  Chief Complaint  Patient presents with  . COPD  . Obstructive Sleep Apnea  . impaired fasting glucose     HPI This 80 year old white male who presents for follow-up of COPD, OSA, impaired glucose, hypertension, hyperlipidemia, and chronic respiratory failure with hypoxia.  COPD with chronic respiratory failure with hypoxia: Currently on 2 L of oxygen.  Patient is also complicated by OSA.  He does wear his CPAP.  Barriers include Stiolto 2 puffs daily and albuterol 2 puffs every 4-6 hours as needed.  Hyperlipidemia and hypertension: Currently on pravastatin for his cholesterol.  Currently on losartan 100 mg once daily and amlodipine 10 mg once daily for blood pressure.  Chronic back pain: Currently on Lyrica 50 mg daily.  He has seen the back surgeon/pain clinic: They did a trial spine stimulator which worked great.  Dr. Rosana Hoes is planning put a permanent stimulator in, but it has been postponed due to upcoming surgery for pvd. He saw Dr. Andria Frames, who performed an angiogram that showed femoral arterial narrowing requiring an endarterectomy.  Patient is a follow-up with cardiology.  He had a heart monitor which showed per the patient his heart rate was 30-140.  This has not been discussed with the cardiologist and the anticipates discussion this morning. Last stress test appears to have been 2017. Echocardiogram done in March showed preserved left ventricular ejection fraction with some aortic valve thickening but no stenosis and no other valvular problems noted. No evidence of pulmonary hypertension suggested. Holter monitor showed some abnormalities, but none that were convincing for causing dizziness.  The nonsustained atrial tachycardia was not represented in the symptomatic events. Dr. Ola Spurr per patient note read the day after our visit, recommended nuclear stress test.  Risk factors for  coronary artery disease include advanced age, hypertension, hyperlipidemia, and family history positive for coronary disease at advanced age-remote history of smoking.     Current Outpatient Medications on File Prior to Visit  Medication Sig Dispense Refill  . aspirin 81 MG EC tablet Take 1 tablet by mouth daily.    . chlorpheniramine (CHLOR-TRIMETON) 4 MG tablet Take 4 mg by mouth 2 (two) times daily as needed for allergies.    . famotidine (PEPCID) 40 MG tablet Take 1 tablet by mouth at bedtime.    . valsartan (DIOVAN) 160 MG tablet Take 0.5 tablets by mouth at bedtime.    Marland Kitchen albuterol (PROVENTIL HFA;VENTOLIN HFA) 108 (90 Base) MCG/ACT inhaler Inhale two puffs every four to six hours as needed for cough or wheeze. 1 Inhaler 1  . amLODipine (NORVASC) 10 MG tablet TAKE 1 TABLET BY MOUTH ONCE DAILY 90 tablet 1  . glucosamine-chondroitin 500-400 MG tablet Take 1 tablet by mouth 3 (three) times daily.    . Multiple Vitamins-Minerals (MULTIVITAMIN WITH MINERALS) tablet Take 1 tablet by mouth daily.    Marland Kitchen omega-3 acid ethyl esters (LOVAZA) 1 g capsule Take 2 capsules (2 g total) by mouth 2 (two) times daily. 360 capsule 0  . omeprazole (PRILOSEC) 40 MG capsule Take 1 capsule (40 mg total) by mouth in the morning and at bedtime. 180 capsule 1  . pravastatin (PRAVACHOL) 20 MG tablet TAKE 1 TABLET(20 MG) BY MOUTH DAILY 90 tablet 1  . pregabalin (LYRICA) 50 MG capsule Take 50 mg by mouth daily.    . Probiotic Product (RESTORA PO) Take 1 tablet by mouth daily.    Marland Kitchen  sertraline (ZOLOFT) 100 MG tablet TAKE 1 TABLET BY MOUTH ONCE DAILY 90 tablet 3  . Tiotropium Bromide-Olodaterol 2.5-2.5 MCG/ACT AERS Inhale 2 puffs into the lungs daily.      No current facility-administered medications on file prior to visit.   Past Medical History:  Diagnosis Date  . Atherosclerosis of aorta ([place])   . Bowel obstruction ([place]) 2018  . BPH (benign prostatic hyperplasia)   . COPD (chronic obstructive pulmonary disease)  ([place])   . COPD (chronic obstructive pulmonary disease) ([place])   . GERD (gastroesophageal reflux disease)   . Hypertension   . Hypoxemia   . Lumbar stenosis with neurogenic claudication 04/24/2017  . Mixed hyperlipidemia   . Osteoarthritis   . Pneumonia [id number]  . Primary insomnia   . Sleep apnea   . Spontaneous ecchymoses    Past Surgical History:  Procedure Laterality Date  . ABDOMINAL AORTIC ANEURYSM REPAIR    . BACK SURGERY N/A [phone], 2021   Lower back and cervical spine  . BACK SURGERY  1984   upper back  . CATARACT EXTRACTION Left 2017  . CATARACT EXTRACTION Right 2017  . CHOLECYSTECTOMY  2018  . HERNIA REPAIR  2018  . PROSTATECTOMY    . ROTATOR CUFF REPAIR Right 2015  . ROTATOR CUFF REPAIR Left 2017    Family History  Problem Relation Age of Onset  . Heart failure Mother   . Diabetes Mother   . CAD Mother   . Hypertension Mother    Social History   Socioeconomic History  . Marital status: Married    Spouse name: Not on file  . Number of children: 2  . Years of education: Not on file  . Highest education level: Not on file  Occupational History  . Occupation: retired    Comment: Event organiser  Tobacco Use  . Smoking status: Former Smoker    Packs/day: 2.00    Years: 50.00    Pack years: 100.00    Types: Cigarettes    Quit date: 12/24/2007    Years since quitting: 13.3  . Smokeless tobacco: Never Used  Vaping Use  . Vaping Use: Never used  Substance and Sexual Activity  . Alcohol use: No    Alcohol/week: 0.0 standard drinks  . Drug use: No  . Sexual activity: Not on file  Other Topics Concern  . Not on file  Social History Narrative  . Not on file   Social Determinants of Health   Financial Resource Strain: Not on file  Food Insecurity: No Food Insecurity  . Worried About Charity fundraiser in the Last Year: Never true  . Ran Out of Food in the Last Year: Never true  Transportation Needs: Not on file  Physical  Activity: Not on file  Stress: Not on file  Social Connections: Not on file    Review of Systems  Constitutional: Negative for chills, fatigue and fever.  HENT: Negative for congestion, ear pain and sore throat.   Respiratory: Positive for shortness of breath (Baseline). Negative for cough.   Cardiovascular: Negative for chest pain.  Gastrointestinal: Negative for abdominal pain, constipation, diarrhea, nausea and vomiting.  Endocrine: Positive for polydipsia. Negative for polyphagia and polyuria.  Genitourinary: Negative for dysuria and frequency.  Musculoskeletal: Negative for arthralgias and myalgias.  Neurological: Positive for headaches (Occasionally). Negative for dizziness.  Psychiatric/Behavioral: Negative for dysphoric mood.       No dysphoria     Objective:  BP 134/70   Pulse 84  Temp (!) 97.4 F (36.3 C)   Resp 18   Ht 5\' 9"  (1.753 m)   Wt 193 lb 6.4 oz (87.7 kg)   BMI 28.56 kg/m   BP/Weight 04/18/2021 01/05/2021 [id number]  Systolic BP [id number]  Diastolic BP 70 70 76  Wt. (Lbs) 193.4 196 196.4  BMI 28.56 28.94 29    Physical Exam Vitals reviewed.  Constitutional:      Appearance: Normal appearance.  Neck:     Vascular: No carotid bruit.  Cardiovascular:     Rate and Rhythm: Normal rate and regular rhythm.     Pulses: Normal pulses.     Heart sounds: Normal heart sounds.  Pulmonary:     Effort: Pulmonary effort is normal.     Breath sounds: Normal breath sounds. No wheezing, rhonchi or rales.  Abdominal:     General: Bowel sounds are normal.     Palpations: Abdomen is soft.     Tenderness: There is no abdominal tenderness.  Musculoskeletal:        General: Tenderness (lumbar) present.  Neurological:     Mental Status: He is alert.  Psychiatric:        Mood and Affect: Mood normal.        Behavior: Behavior normal.     Diabetic Foot Exam - Simple   No data filed      Lab Results  Component Value Date   WBC 14.9 (H) 04/13/2021   HGB  14.8 04/13/2021   HCT 43.8 04/13/2021   PLT 245 04/13/2021   GLUCOSE 113 (H) 04/13/2021   CHOL 169 04/13/2021   TRIG 132 04/13/2021   HDL 45 04/13/2021   LDLCALC 100 (H) 04/13/2021   ALT 24 04/13/2021   AST 21 04/13/2021   NA 140 04/13/2021   K 3.7 04/13/2021   CL 103 04/13/2021   CREATININE 0.81 04/13/2021   BUN 14 04/13/2021   CO2 20 04/13/2021   TSH 1.340 04/13/2021   HGBA1C 5.6 04/13/2021      Assessment & Plan:   1. Mixed hyperlipidemia Greatly improved. In light of peripheral vascular disease however I do recommend increasing pravastatin to 40 mg once daily.  Continue Lovaza 1 g 2 capsules twice daily.  2. Essential hypertension Well controlled.  No changes to medicines.  Continue to work on eating a healthy diet. Unable to exercise.  Labs reviewed with patient  3. Impaired glucose tolerance At goal.   4. Extremity atherosclerosis with intermittent claudication ([place]) Defer to Dr. Lester [place]. Vascular surgery planned.   5. Chronic bilateral low back pain without sciatica Defer to Dr. Rosana Hoes.  I am pleased that the spine stimulator trial went well.  Patient has been in a lot of pain for many months.  6. Gastroesophageal reflux disease without esophagitis Continue current medication.  7. Coronary artery disease involving native coronary artery of native heart without angina pectoris Defer management to cardiology.  Doing great getting an ischemic work-up prior to surgery as advantageous.  8. Chronic respiratory failure with hypoxia (HCC) Continue oxygen at 2 L  9. Atherosclerosis of aorta (HCC) Increase pravastatin as stated above.  10. Oxygen dependent Continue O2 at 2 L  11. BMI 28.0-28.9,adult Continue eating healthy.  Unable to exercise.   12. Mild recurrent major depression ([place]) Continue Zoloft.  Well-controlled  13. Leukocytosis. Refer to hematology.   Follow-up: Return in about 3 months (around 07/18/2021) for fasting.  An After Visit  Summary was printed and given to the patient.  [name], MD [facility] [phone]

## 2021-04-20 ENCOUNTER — Telehealth: Payer: Self-pay

## 2021-04-20 NOTE — Telephone Encounter (Signed)
Pt calling states he was supposed to ask cardiology about seeing hematology but he forgot. But he agrees to proceed with hematology referral as Dr. Tobie Poet suggested.   William Clay, [place] 04/20/21 10:54 AM

## 2021-04-22 ENCOUNTER — Encounter: Payer: Self-pay | Admitting: Family Medicine

## 2021-04-24 DIAGNOSIS — I25119 Atherosclerotic heart disease of native coronary artery with unspecified angina pectoris: Secondary | ICD-10-CM | POA: Diagnosis not present

## 2021-04-25 DIAGNOSIS — I159 Secondary hypertension, unspecified: Secondary | ICD-10-CM | POA: Diagnosis not present

## 2021-04-25 DIAGNOSIS — I2510 Atherosclerotic heart disease of native coronary artery without angina pectoris: Secondary | ICD-10-CM | POA: Diagnosis not present

## 2021-04-25 DIAGNOSIS — I739 Peripheral vascular disease, unspecified: Secondary | ICD-10-CM | POA: Diagnosis not present

## 2021-04-25 DIAGNOSIS — G4733 Obstructive sleep apnea (adult) (pediatric): Secondary | ICD-10-CM | POA: Diagnosis not present

## 2021-04-25 DIAGNOSIS — Z9989 Dependence on other enabling machines and devices: Secondary | ICD-10-CM | POA: Diagnosis not present

## 2021-04-25 DIAGNOSIS — J439 Emphysema, unspecified: Secondary | ICD-10-CM | POA: Diagnosis not present

## 2021-04-26 ENCOUNTER — Other Ambulatory Visit: Payer: Self-pay | Admitting: Family Medicine

## 2021-04-26 DIAGNOSIS — G894 Chronic pain syndrome: Secondary | ICD-10-CM | POA: Diagnosis not present

## 2021-04-26 DIAGNOSIS — Z6827 Body mass index (BMI) 27.0-27.9, adult: Secondary | ICD-10-CM | POA: Diagnosis not present

## 2021-04-26 DIAGNOSIS — M961 Postlaminectomy syndrome, not elsewhere classified: Secondary | ICD-10-CM | POA: Diagnosis not present

## 2021-04-26 DIAGNOSIS — M4726 Other spondylosis with radiculopathy, lumbar region: Secondary | ICD-10-CM | POA: Diagnosis not present

## 2021-04-26 NOTE — Addendum Note (Signed)
Addended byRochel Brome on: 04/26/2021 12:19 PM   Modules accepted: Orders

## 2021-04-30 ENCOUNTER — Other Ambulatory Visit: Payer: Self-pay

## 2021-05-01 DIAGNOSIS — K219 Gastro-esophageal reflux disease without esophagitis: Secondary | ICD-10-CM | POA: Diagnosis present

## 2021-05-01 DIAGNOSIS — I70213 Atherosclerosis of native arteries of extremities with intermittent claudication, bilateral legs: Secondary | ICD-10-CM | POA: Diagnosis present

## 2021-05-01 DIAGNOSIS — I739 Peripheral vascular disease, unspecified: Secondary | ICD-10-CM | POA: Diagnosis not present

## 2021-05-01 DIAGNOSIS — J449 Chronic obstructive pulmonary disease, unspecified: Secondary | ICD-10-CM | POA: Diagnosis present

## 2021-05-01 DIAGNOSIS — Z87891 Personal history of nicotine dependence: Secondary | ICD-10-CM | POA: Diagnosis not present

## 2021-05-01 DIAGNOSIS — J9601 Acute respiratory failure with hypoxia: Secondary | ICD-10-CM | POA: Diagnosis present

## 2021-05-01 DIAGNOSIS — E785 Hyperlipidemia, unspecified: Secondary | ICD-10-CM | POA: Diagnosis present

## 2021-05-01 DIAGNOSIS — I70212 Atherosclerosis of native arteries of extremities with intermittent claudication, left leg: Secondary | ICD-10-CM | POA: Diagnosis not present

## 2021-05-01 DIAGNOSIS — I471 Supraventricular tachycardia: Secondary | ICD-10-CM | POA: Diagnosis present

## 2021-05-01 DIAGNOSIS — I10 Essential (primary) hypertension: Secondary | ICD-10-CM | POA: Diagnosis present

## 2021-05-01 DIAGNOSIS — G4733 Obstructive sleep apnea (adult) (pediatric): Secondary | ICD-10-CM | POA: Diagnosis present

## 2021-05-02 ENCOUNTER — Other Ambulatory Visit: Payer: Self-pay | Admitting: Family Medicine

## 2021-05-03 ENCOUNTER — Telehealth: Payer: Self-pay | Admitting: Hematology and Oncology

## 2021-05-03 DIAGNOSIS — Z87891 Personal history of nicotine dependence: Secondary | ICD-10-CM | POA: Diagnosis not present

## 2021-05-03 DIAGNOSIS — Z48812 Encounter for surgical aftercare following surgery on the circulatory system: Secondary | ICD-10-CM | POA: Diagnosis not present

## 2021-05-03 DIAGNOSIS — G4730 Sleep apnea, unspecified: Secondary | ICD-10-CM | POA: Diagnosis not present

## 2021-05-03 DIAGNOSIS — Z7902 Long term (current) use of antithrombotics/antiplatelets: Secondary | ICD-10-CM | POA: Diagnosis not present

## 2021-05-03 DIAGNOSIS — Z7982 Long term (current) use of aspirin: Secondary | ICD-10-CM | POA: Diagnosis not present

## 2021-05-03 DIAGNOSIS — I10 Essential (primary) hypertension: Secondary | ICD-10-CM | POA: Diagnosis not present

## 2021-05-03 DIAGNOSIS — G894 Chronic pain syndrome: Secondary | ICD-10-CM | POA: Diagnosis not present

## 2021-05-03 DIAGNOSIS — J449 Chronic obstructive pulmonary disease, unspecified: Secondary | ICD-10-CM | POA: Diagnosis not present

## 2021-05-03 DIAGNOSIS — I739 Peripheral vascular disease, unspecified: Secondary | ICD-10-CM | POA: Diagnosis not present

## 2021-05-03 DIAGNOSIS — I2510 Atherosclerotic heart disease of native coronary artery without angina pectoris: Secondary | ICD-10-CM | POA: Diagnosis not present

## 2021-05-03 DIAGNOSIS — Z9989 Dependence on other enabling machines and devices: Secondary | ICD-10-CM | POA: Diagnosis not present

## 2021-05-03 DIAGNOSIS — S75012A Minor laceration of femoral artery, left leg, initial encounter: Secondary | ICD-10-CM | POA: Diagnosis not present

## 2021-05-03 DIAGNOSIS — Z9981 Dependence on supplemental oxygen: Secondary | ICD-10-CM | POA: Diagnosis not present

## 2021-05-03 NOTE — Telephone Encounter (Signed)
Patient referred by Dr [name] for Elevated WBC.  Appt made for 05/09/21 Consult 1:00 pm   (Addded Lab Appt at 2:30 pm- This will help out with [facility] at [facility] in case you request Labs)

## 2021-05-03 NOTE — Telephone Encounter (Signed)
Got it, thanks!

## 2021-05-04 DIAGNOSIS — Z48812 Encounter for surgical aftercare following surgery on the circulatory system: Secondary | ICD-10-CM | POA: Diagnosis not present

## 2021-05-04 DIAGNOSIS — J449 Chronic obstructive pulmonary disease, unspecified: Secondary | ICD-10-CM | POA: Diagnosis not present

## 2021-05-04 DIAGNOSIS — G894 Chronic pain syndrome: Secondary | ICD-10-CM | POA: Diagnosis not present

## 2021-05-04 DIAGNOSIS — Z7951 Long term (current) use of inhaled steroids: Secondary | ICD-10-CM | POA: Diagnosis not present

## 2021-05-04 DIAGNOSIS — I739 Peripheral vascular disease, unspecified: Secondary | ICD-10-CM | POA: Diagnosis not present

## 2021-05-04 DIAGNOSIS — Z79899 Other long term (current) drug therapy: Secondary | ICD-10-CM | POA: Diagnosis not present

## 2021-05-04 DIAGNOSIS — Z7902 Long term (current) use of antithrombotics/antiplatelets: Secondary | ICD-10-CM | POA: Diagnosis not present

## 2021-05-04 DIAGNOSIS — Z87891 Personal history of nicotine dependence: Secondary | ICD-10-CM | POA: Diagnosis not present

## 2021-05-04 DIAGNOSIS — R0609 Other forms of dyspnea: Secondary | ICD-10-CM | POA: Diagnosis not present

## 2021-05-04 DIAGNOSIS — R0600 Dyspnea, unspecified: Secondary | ICD-10-CM | POA: Diagnosis not present

## 2021-05-04 DIAGNOSIS — I2510 Atherosclerotic heart disease of native coronary artery without angina pectoris: Secondary | ICD-10-CM | POA: Diagnosis not present

## 2021-05-04 DIAGNOSIS — I10 Essential (primary) hypertension: Secondary | ICD-10-CM | POA: Diagnosis not present

## 2021-05-05 DIAGNOSIS — Z7902 Long term (current) use of antithrombotics/antiplatelets: Secondary | ICD-10-CM | POA: Diagnosis not present

## 2021-05-05 DIAGNOSIS — G894 Chronic pain syndrome: Secondary | ICD-10-CM | POA: Diagnosis not present

## 2021-05-05 DIAGNOSIS — Z48812 Encounter for surgical aftercare following surgery on the circulatory system: Secondary | ICD-10-CM | POA: Diagnosis not present

## 2021-05-05 DIAGNOSIS — I10 Essential (primary) hypertension: Secondary | ICD-10-CM | POA: Diagnosis not present

## 2021-05-05 DIAGNOSIS — I739 Peripheral vascular disease, unspecified: Secondary | ICD-10-CM | POA: Diagnosis not present

## 2021-05-05 DIAGNOSIS — I2510 Atherosclerotic heart disease of native coronary artery without angina pectoris: Secondary | ICD-10-CM | POA: Diagnosis not present

## 2021-05-07 DIAGNOSIS — Z87891 Personal history of nicotine dependence: Secondary | ICD-10-CM | POA: Insufficient documentation

## 2021-05-07 DIAGNOSIS — I2510 Atherosclerotic heart disease of native coronary artery without angina pectoris: Secondary | ICD-10-CM | POA: Diagnosis not present

## 2021-05-07 DIAGNOSIS — Z7902 Long term (current) use of antithrombotics/antiplatelets: Secondary | ICD-10-CM | POA: Diagnosis not present

## 2021-05-07 DIAGNOSIS — I739 Peripheral vascular disease, unspecified: Secondary | ICD-10-CM | POA: Diagnosis not present

## 2021-05-07 DIAGNOSIS — G894 Chronic pain syndrome: Secondary | ICD-10-CM | POA: Diagnosis not present

## 2021-05-07 DIAGNOSIS — I10 Essential (primary) hypertension: Secondary | ICD-10-CM | POA: Diagnosis not present

## 2021-05-07 DIAGNOSIS — R0609 Other forms of dyspnea: Secondary | ICD-10-CM | POA: Insufficient documentation

## 2021-05-07 DIAGNOSIS — Z48812 Encounter for surgical aftercare following surgery on the circulatory system: Secondary | ICD-10-CM | POA: Diagnosis not present

## 2021-05-08 DIAGNOSIS — I10 Essential (primary) hypertension: Secondary | ICD-10-CM | POA: Diagnosis not present

## 2021-05-08 DIAGNOSIS — Z7902 Long term (current) use of antithrombotics/antiplatelets: Secondary | ICD-10-CM | POA: Diagnosis not present

## 2021-05-08 DIAGNOSIS — G894 Chronic pain syndrome: Secondary | ICD-10-CM | POA: Diagnosis not present

## 2021-05-08 DIAGNOSIS — Z48812 Encounter for surgical aftercare following surgery on the circulatory system: Secondary | ICD-10-CM | POA: Diagnosis not present

## 2021-05-08 DIAGNOSIS — I2510 Atherosclerotic heart disease of native coronary artery without angina pectoris: Secondary | ICD-10-CM | POA: Diagnosis not present

## 2021-05-08 DIAGNOSIS — I739 Peripheral vascular disease, unspecified: Secondary | ICD-10-CM | POA: Diagnosis not present

## 2021-05-09 ENCOUNTER — Other Ambulatory Visit: Payer: Self-pay

## 2021-05-09 ENCOUNTER — Encounter: Payer: Self-pay | Admitting: Hematology and Oncology

## 2021-05-09 ENCOUNTER — Inpatient Hospital Stay: Payer: Medicare Other

## 2021-05-09 ENCOUNTER — Inpatient Hospital Stay: Payer: Medicare Other | Attending: Hematology and Oncology | Admitting: Hematology and Oncology

## 2021-05-09 DIAGNOSIS — D72829 Elevated white blood cell count, unspecified: Secondary | ICD-10-CM | POA: Insufficient documentation

## 2021-05-09 DIAGNOSIS — J449 Chronic obstructive pulmonary disease, unspecified: Secondary | ICD-10-CM | POA: Insufficient documentation

## 2021-05-09 DIAGNOSIS — N400 Enlarged prostate without lower urinary tract symptoms: Secondary | ICD-10-CM | POA: Insufficient documentation

## 2021-05-09 DIAGNOSIS — Z7982 Long term (current) use of aspirin: Secondary | ICD-10-CM | POA: Diagnosis not present

## 2021-05-09 DIAGNOSIS — D72825 Bandemia: Secondary | ICD-10-CM

## 2021-05-09 DIAGNOSIS — Z79899 Other long term (current) drug therapy: Secondary | ICD-10-CM | POA: Diagnosis not present

## 2021-05-09 DIAGNOSIS — I2510 Atherosclerotic heart disease of native coronary artery without angina pectoris: Secondary | ICD-10-CM | POA: Insufficient documentation

## 2021-05-09 DIAGNOSIS — K219 Gastro-esophageal reflux disease without esophagitis: Secondary | ICD-10-CM | POA: Insufficient documentation

## 2021-05-09 DIAGNOSIS — Z7901 Long term (current) use of anticoagulants: Secondary | ICD-10-CM | POA: Insufficient documentation

## 2021-05-09 DIAGNOSIS — J309 Allergic rhinitis, unspecified: Secondary | ICD-10-CM | POA: Insufficient documentation

## 2021-05-09 DIAGNOSIS — J302 Other seasonal allergic rhinitis: Secondary | ICD-10-CM

## 2021-05-09 DIAGNOSIS — J410 Simple chronic bronchitis: Secondary | ICD-10-CM

## 2021-05-09 DIAGNOSIS — Z87891 Personal history of nicotine dependence: Secondary | ICD-10-CM | POA: Insufficient documentation

## 2021-05-09 DIAGNOSIS — I7090 Unspecified atherosclerosis: Secondary | ICD-10-CM | POA: Diagnosis not present

## 2021-05-09 DIAGNOSIS — I10 Essential (primary) hypertension: Secondary | ICD-10-CM | POA: Insufficient documentation

## 2021-05-09 NOTE — Progress Notes (Signed)
[facility] NOTE  Patient Care Team: [name], MD as PCP - General (Family Medicine) Burnice Logan, [facility] as Pharmacist (Pharmacist) Murlean Iba, MD as Referring Physician (Orthopedic Surgery)  CHIEF COMPLAINTS/PURPOSE OF CONSULTATION:  Chronic leukocytosis  HISTORY OF PRESENTING ILLNESS:  William Clay 80 y.o. male is here because of elevated WBC. He is here accompanied by his wife  He was found to have abnormal CBC from routine blood work I have the opportunity to review his CBC dated back all the way to 2018 His white blood cell count typically range between 10.1 to as high as 17.1 On August 26, 2017, his white blood cell count was 15.8 Most recently in May, after recent surgery, his white blood cell count went up to as high as 17.1.  The white blood cell differential confirm predominantly neutrophilia The patient have chronic sinusitis secondarily to allergic rhinitis.  He also has chronic COPD with mild productive cough especially in the morning.  He had intermittent flare of COPD exacerbation over the years and the last course of antibiotics was several months ago.  He recalls taking prednisone therapy.  He did not reported symptoms of urinary frequency/urgency or dysuria, diarrhea, joint swelling/pain or abnormal skin rash.  He had no prior history or diagnosis of cancer. His age appropriate screening programs are up-to-date. He has significant 50-pack-year history but quit in March 2009 when his good friend was diagnosed with throat cancer He uses CPAP machine +2 L of oxygen at nighttime.  MEDICAL HISTORY:  Past Medical History:  Diagnosis Date  . Atherosclerosis of aorta ([place])   . Bowel obstruction ([place]) 2018  . BPH (benign prostatic hyperplasia)   . COPD (chronic obstructive pulmonary disease) ([place])   . COPD (chronic obstructive pulmonary disease) ([place])   . GERD (gastroesophageal reflux disease)   . Hypertension   . Hypoxemia    . Lumbar stenosis with neurogenic claudication 04/24/2017  . Mixed hyperlipidemia   . Osteoarthritis   . Pneumonia 4196,[id number]  . Primary insomnia   . Sleep apnea   . Spontaneous ecchymoses     SURGICAL HISTORY: Past Surgical History:  Procedure Laterality Date  . ABDOMINAL AORTIC ANEURYSM REPAIR    . BACK SURGERY N/A [phone], 2021   Lower back and cervical spine  . BACK SURGERY  1984   upper back  . CATARACT EXTRACTION Left 2017  . CATARACT EXTRACTION Right 2017  . CHOLECYSTECTOMY  2018  . HERNIA REPAIR  2018  . PROSTATECTOMY    . ROTATOR CUFF REPAIR Right 2015  . ROTATOR CUFF REPAIR Left 2017    SOCIAL HISTORY: Social History   Socioeconomic History  . Marital status: Married    Spouse name: Vaughan Basta  . Number of children: 2  . Years of education: Not on file  . Highest education level: Not on file  Occupational History  . Occupation: retired    Comment: Event organiser  Tobacco Use  . Smoking status: Former Smoker    Packs/day: 2.00    Years: 50.00    Pack years: 100.00    Types: Cigarettes    Quit date: 12/24/2007    Years since quitting: 13.3  . Smokeless tobacco: Never Used  Vaping Use  . Vaping Use: Never used  Substance and Sexual Activity  . Alcohol use: No    Alcohol/week: 0.0 standard drinks  . Drug use: No  . Sexual activity: Not on file  Other Topics Concern  . Not on  file  Social History Narrative  . Not on file   Social Determinants of Health   Financial Resource Strain: Not on file  Food Insecurity: No Food Insecurity  . Worried About Charity fundraiser in the Last Year: Never true  . Ran Out of Food in the Last Year: Never true  Transportation Needs: Not on file  Physical Activity: Not on file  Stress: Not on file  Social Connections: Not on file  Intimate Partner Violence: Not on file    FAMILY HISTORY: Family History  Problem Relation Age of Onset  . Heart failure Mother   . Diabetes Mother   . CAD  Mother   . Hypertension Mother     ALLERGIES:  is allergic to cefadroxil, lisinopril, simvastatin, amoxicillin, gabapentin, metoprolol, and potassium clavulanate [clavulanic acid].  MEDICATIONS:  Current Outpatient Medications  Medication Sig Dispense Refill  . albuterol (PROVENTIL HFA;VENTOLIN HFA) 108 (90 Base) MCG/ACT inhaler Inhale two puffs every four to six hours as needed for cough or wheeze. 1 Inhaler 1  . amLODipine (NORVASC) 10 MG tablet TAKE 1 TABLET BY MOUTH ONCE DAILY 90 tablet 1  . aspirin EC 81 MG tablet Take 81 mg by mouth daily. Swallow whole.    . chlorpheniramine (CHLOR-TRIMETON) 4 MG tablet Take 4 mg by mouth 2 (two) times daily as needed for allergies.    Marland Kitchen clopidogrel (PLAVIX) 75 MG tablet Take 1 tablet by mouth daily.    Marland Kitchen losartan (COZAAR) 100 MG tablet Take 1 tablet by mouth daily.    . Multiple Vitamins-Minerals (MULTIVITAMIN WITH MINERALS) tablet Take 1 tablet by mouth daily.    Marland Kitchen omega-3 acid ethyl esters (LOVAZA) 1 g capsule Take 2 capsules (2 g total) by mouth 2 (two) times daily. 360 capsule 0  . omeprazole (PRILOSEC) 40 MG capsule Take 1 capsule (40 mg total) by mouth in the morning and at bedtime. 180 capsule 1  . pravastatin (PRAVACHOL) 20 MG tablet Take by mouth.    . pregabalin (LYRICA) 50 MG capsule Take 50 mg by mouth daily.    . Probiotic Product (RESTORA PO) Take 1 tablet by mouth daily.    . sertraline (ZOLOFT) 100 MG tablet TAKE 1 TABLET BY MOUTH ONCE DAILY 90 tablet 3  . Tiotropium Bromide-Olodaterol 2.5-2.5 MCG/ACT AERS Inhale 2 puffs into the lungs daily.     . Omega-3 1000 MG CAPS Take by mouth.     No current facility-administered medications for this visit.    REVIEW OF SYSTEMS:   Constitutional: Denies fevers, chills or abnormal night sweats Eyes: Denies blurriness of vision, double vision or watery eyes EaCardiovascular: Denies palpitation, chest discomfort or lower extremity swelling Gastrointestinal:  Denies nausea, heartburn or  change in bowel habits Skin: Denies abnormal skin rashes Lymphatics: Denies new lymphadenopathy or easy bruising Neurological:Denies numbness, tingling or new weaknesses Behavioral/Psych: Mood is stable, no new changes  All other systems were reviewed with the patient and are negative.  PHYSICAL EXAMINATION: ECOG PERFORMANCE STATUS: 1 - Symptomatic but completely ambulatory  Vitals:   05/09/21 1300  BP: 125/85  Pulse: 75  Resp: 18  Temp: (!) 97.4 F (36.3 C)  SpO2: 94%   Filed Weights   05/09/21 1300  Weight: 189 lb 4 oz (85.8 kg)    GENERAL:alert, no distress and comfortable SKIN: Noted thin skin and multiple old bruises EYES: normal, conjunctiva are pink and non-injected, sclera clear OROPHARYNX:no exudate, no erythema and lips, buccal mucosa, and tongue normal  NECK: supple,  thyroid normal size, non-tender, without nodularity LYMPH:  no palpable lymphadenopathy in the cervical, axillary or inguinal LUNGS: clear to auscultation and percussion with normal breathing effort HEART: regular rate & rhythm and no murmurs and no lower extremity edema ABDOMEN:abdomen soft, non-tender and normal bowel sounds Musculoskeletal:no cyanosis of digits and no clubbing  PSYCH: alert & oriented x 3 with fluent speech NEURO: no focal motor/sensory deficits  LABORATORY DATA:  I have reviewed the data as listed Recent Results (from the past 2160 hour(s))  CBC     Status: Abnormal   Collection Time: 04/13/21  9:23 AM  Result Value Ref Range   WBC 14.9 (H) 3.4 - 10.8 x10E3/uL   RBC 4.58 4.14 - 5.80 x10E6/uL   Hemoglobin 14.8 13.0 - 17.7 g/dL   Hematocrit 43.8 37.5 - 51.0 %   MCV 96 79 - 97 fL   MCH 32.3 26.6 - 33.0 pg   MCHC 33.8 31.5 - 35.7 g/dL   RDW 12.9 11.6 - 15.4 %   Platelets 245 150 - 450 x10E3/uL  Comprehensive metabolic panel     Status: Abnormal   Collection Time: 04/13/21  9:23 AM  Result Value Ref Range   Glucose 113 (H) 65 - 99 mg/dL   BUN 14 8 - 27 mg/dL    Creatinine, Ser 0.81 0.76 - 1.27 mg/dL   eGFR 91 >59 mL/min/1.73   BUN/Creatinine Ratio 17 10 - 24   Sodium 140 134 - 144 mmol/L   Potassium 3.7 3.5 - 5.2 mmol/L   Chloride 103 96 - 106 mmol/L   CO2 20 20 - 29 mmol/L   Calcium 9.9 8.6 - 10.2 mg/dL   Total Protein 6.9 6.0 - 8.5 g/dL   Albumin 4.3 3.7 - 4.7 g/dL   Globulin, Total 2.6 1.5 - 4.5 g/dL   Albumin/Globulin Ratio 1.7 1.2 - 2.2   Bilirubin Total 0.5 0.0 - 1.2 mg/dL   Alkaline Phosphatase 86 44 - 121 IU/L   AST 21 0 - 40 IU/L   ALT 24 0 - 44 IU/L  TSH     Status: None   Collection Time: 04/13/21  9:23 AM  Result Value Ref Range   TSH 1.340 0.450 - 4.500 uIU/mL  Lipid panel     Status: Abnormal   Collection Time: 04/13/21  9:23 AM  Result Value Ref Range   Cholesterol, Total 169 100 - 199 mg/dL   Triglycerides 132 0 - 149 mg/dL   HDL 45 >39 mg/dL   VLDL Cholesterol Cal 24 5 - 40 mg/dL   LDL Chol Calc (NIH) 100 (H) 0 - 99 mg/dL   Chol/HDL Ratio 3.8 0.0 - 5.0 ratio    Comment:                                   T. Chol/HDL Ratio                                             Men  Women                               1/2 Avg.Risk  3.4    3.3  Avg.Risk  5.0    4.4                                2X Avg.Risk  9.6    7.1                                3X Avg.Risk 23.4   11.0   Hemoglobin A1c     Status: None   Collection Time: 04/13/21  9:23 AM  Result Value Ref Range   Hgb A1c MFr Bld 5.6 4.8 - 5.6 %    Comment:          Prediabetes: 5.7 - 6.4          Diabetes: >6.4          Glycemic control for adults with diabetes: <7.0    Est. average glucose Bld gHb Est-mCnc 114 mg/dL  Cardiovascular Risk Assessment     Status: None   Collection Time: 04/13/21  9:23 AM  Result Value Ref Range   Interpretation Note     Comment: Supplemental report is available.    ASSESSMENT & PLAN  Leukocytosis On review of his blood work, I felt that his chronic leukocytosis is most likely triggered by allergic  rhinitis and intermittent flare of COPD.  I do not recommend repeat blood work now as he still have mild COPD related cough and allergic rhinitis He has appointment to see his primary care doctor with blood work to be done at the end of July and I think that would be the perfect time to repeat CBC and to check inflammatory markers This is a benign situation and does not need extensive work-up or follow-up I will call him once his tests results are available  Chronic obstructive pulmonary disease ([place]) He is on chronic inhalers and uses oxygen at home with his CPAP machine He has no flare on exam right now   Allergic rhinitis He has mild allergic rhinitis and will continue OTC antihistamines   Orders Placed This Encounter  Procedures  . CBC with Differential/Platelet    Standing Status:   Future    Standing Expiration Date:   05/10/2022  . Sedimentation rate    Standing Status:   Future    Standing Expiration Date:   05/10/2022  . C-reactive protein    Standing Status:   Future    Standing Expiration Date:   05/10/2022    All questions were answered. The patient knows to call the clinic with any problems, questions or concerns. I spent 55 minutes counseling the patient face to face and on counseling.     Heath Lark, MD 05/10/2021 8:44 AM

## 2021-05-09 NOTE — Assessment & Plan Note (Signed)
>>  ASSESSMENT AND PLAN FOR CHRONIC OBSTRUCTIVE PULMONARY DISEASE ([facility]) WRITTEN ON 05/09/2021  1:55 PM BY GORSUCH, NI, MD  He is on chronic inhalers and uses oxygen at home with his CPAP machine He has no flare on exam right now

## 2021-05-09 NOTE — Assessment & Plan Note (Addendum)
He is on chronic inhalers and uses oxygen at home with his CPAP machine He has no flare on exam right now

## 2021-05-09 NOTE — Assessment & Plan Note (Addendum)
On review of his blood work, I felt that his chronic leukocytosis is most likely triggered by allergic rhinitis and intermittent flare of COPD.  I do not recommend repeat blood work now as he still have mild COPD related cough and allergic rhinitis He has appointment to see his primary care doctor with blood work to be done at the end of July and I think that would be the perfect time to repeat CBC and to check inflammatory markers This is a benign situation and does not need extensive work-up or follow-up I will call him once his tests results are available

## 2021-05-09 NOTE — Assessment & Plan Note (Signed)
He has mild allergic rhinitis and will continue OTC antihistamines

## 2021-05-10 DIAGNOSIS — Z7902 Long term (current) use of antithrombotics/antiplatelets: Secondary | ICD-10-CM | POA: Diagnosis not present

## 2021-05-10 DIAGNOSIS — Z48812 Encounter for surgical aftercare following surgery on the circulatory system: Secondary | ICD-10-CM | POA: Diagnosis not present

## 2021-05-10 DIAGNOSIS — G894 Chronic pain syndrome: Secondary | ICD-10-CM | POA: Diagnosis not present

## 2021-05-10 DIAGNOSIS — I2510 Atherosclerotic heart disease of native coronary artery without angina pectoris: Secondary | ICD-10-CM | POA: Diagnosis not present

## 2021-05-10 DIAGNOSIS — I739 Peripheral vascular disease, unspecified: Secondary | ICD-10-CM | POA: Diagnosis not present

## 2021-05-10 DIAGNOSIS — I10 Essential (primary) hypertension: Secondary | ICD-10-CM | POA: Diagnosis not present

## 2021-05-11 DIAGNOSIS — Z48812 Encounter for surgical aftercare following surgery on the circulatory system: Secondary | ICD-10-CM | POA: Diagnosis not present

## 2021-05-11 DIAGNOSIS — I739 Peripheral vascular disease, unspecified: Secondary | ICD-10-CM | POA: Diagnosis not present

## 2021-05-11 DIAGNOSIS — I2510 Atherosclerotic heart disease of native coronary artery without angina pectoris: Secondary | ICD-10-CM | POA: Diagnosis not present

## 2021-05-11 DIAGNOSIS — G894 Chronic pain syndrome: Secondary | ICD-10-CM | POA: Diagnosis not present

## 2021-05-11 DIAGNOSIS — I10 Essential (primary) hypertension: Secondary | ICD-10-CM | POA: Diagnosis not present

## 2021-05-11 DIAGNOSIS — Z7902 Long term (current) use of antithrombotics/antiplatelets: Secondary | ICD-10-CM | POA: Diagnosis not present

## 2021-05-13 ENCOUNTER — Other Ambulatory Visit: Payer: Self-pay | Admitting: Family Medicine

## 2021-05-13 DIAGNOSIS — M25551 Pain in right hip: Secondary | ICD-10-CM

## 2021-05-13 DIAGNOSIS — I10 Essential (primary) hypertension: Secondary | ICD-10-CM

## 2021-05-13 DIAGNOSIS — E782 Mixed hyperlipidemia: Secondary | ICD-10-CM

## 2021-05-13 DIAGNOSIS — R7302 Impaired glucose tolerance (oral): Secondary | ICD-10-CM

## 2021-05-14 DIAGNOSIS — G894 Chronic pain syndrome: Secondary | ICD-10-CM | POA: Diagnosis not present

## 2021-05-14 DIAGNOSIS — I2510 Atherosclerotic heart disease of native coronary artery without angina pectoris: Secondary | ICD-10-CM | POA: Diagnosis not present

## 2021-05-14 DIAGNOSIS — Z48812 Encounter for surgical aftercare following surgery on the circulatory system: Secondary | ICD-10-CM | POA: Diagnosis not present

## 2021-05-14 DIAGNOSIS — I10 Essential (primary) hypertension: Secondary | ICD-10-CM | POA: Diagnosis not present

## 2021-05-14 DIAGNOSIS — I739 Peripheral vascular disease, unspecified: Secondary | ICD-10-CM | POA: Diagnosis not present

## 2021-05-14 DIAGNOSIS — Z7902 Long term (current) use of antithrombotics/antiplatelets: Secondary | ICD-10-CM | POA: Diagnosis not present

## 2021-05-15 ENCOUNTER — Other Ambulatory Visit: Payer: Self-pay

## 2021-05-15 DIAGNOSIS — Z7902 Long term (current) use of antithrombotics/antiplatelets: Secondary | ICD-10-CM | POA: Diagnosis not present

## 2021-05-15 DIAGNOSIS — G894 Chronic pain syndrome: Secondary | ICD-10-CM | POA: Diagnosis not present

## 2021-05-15 DIAGNOSIS — Z48812 Encounter for surgical aftercare following surgery on the circulatory system: Secondary | ICD-10-CM | POA: Diagnosis not present

## 2021-05-15 DIAGNOSIS — I2510 Atherosclerotic heart disease of native coronary artery without angina pectoris: Secondary | ICD-10-CM | POA: Diagnosis not present

## 2021-05-15 DIAGNOSIS — I739 Peripheral vascular disease, unspecified: Secondary | ICD-10-CM | POA: Diagnosis not present

## 2021-05-15 DIAGNOSIS — I10 Essential (primary) hypertension: Secondary | ICD-10-CM | POA: Diagnosis not present

## 2021-05-15 MED ORDER — PREGABALIN 50 MG PO CAPS: 50.0000 mg | ORAL_CAPSULE | Freq: Every day | ORAL | 2 refills | Status: DC

## 2021-05-17 DIAGNOSIS — Z48812 Encounter for surgical aftercare following surgery on the circulatory system: Secondary | ICD-10-CM | POA: Diagnosis not present

## 2021-05-17 DIAGNOSIS — I2510 Atherosclerotic heart disease of native coronary artery without angina pectoris: Secondary | ICD-10-CM | POA: Diagnosis not present

## 2021-05-17 DIAGNOSIS — I10 Essential (primary) hypertension: Secondary | ICD-10-CM | POA: Diagnosis not present

## 2021-05-17 DIAGNOSIS — Z7902 Long term (current) use of antithrombotics/antiplatelets: Secondary | ICD-10-CM | POA: Diagnosis not present

## 2021-05-17 DIAGNOSIS — G894 Chronic pain syndrome: Secondary | ICD-10-CM | POA: Diagnosis not present

## 2021-05-17 DIAGNOSIS — I739 Peripheral vascular disease, unspecified: Secondary | ICD-10-CM | POA: Diagnosis not present

## 2021-05-18 DIAGNOSIS — Z87891 Personal history of nicotine dependence: Secondary | ICD-10-CM | POA: Diagnosis not present

## 2021-05-18 DIAGNOSIS — R0600 Dyspnea, unspecified: Secondary | ICD-10-CM | POA: Diagnosis not present

## 2021-05-22 DIAGNOSIS — Z48812 Encounter for surgical aftercare following surgery on the circulatory system: Secondary | ICD-10-CM | POA: Diagnosis not present

## 2021-05-22 DIAGNOSIS — I10 Essential (primary) hypertension: Secondary | ICD-10-CM | POA: Diagnosis not present

## 2021-05-22 DIAGNOSIS — I739 Peripheral vascular disease, unspecified: Secondary | ICD-10-CM | POA: Diagnosis not present

## 2021-05-22 DIAGNOSIS — G894 Chronic pain syndrome: Secondary | ICD-10-CM | POA: Diagnosis not present

## 2021-05-22 DIAGNOSIS — Z7902 Long term (current) use of antithrombotics/antiplatelets: Secondary | ICD-10-CM | POA: Diagnosis not present

## 2021-05-22 DIAGNOSIS — I2510 Atherosclerotic heart disease of native coronary artery without angina pectoris: Secondary | ICD-10-CM | POA: Diagnosis not present

## 2021-05-23 DIAGNOSIS — I10 Essential (primary) hypertension: Secondary | ICD-10-CM | POA: Diagnosis not present

## 2021-05-23 DIAGNOSIS — Z7902 Long term (current) use of antithrombotics/antiplatelets: Secondary | ICD-10-CM | POA: Diagnosis not present

## 2021-05-23 DIAGNOSIS — Z48812 Encounter for surgical aftercare following surgery on the circulatory system: Secondary | ICD-10-CM | POA: Diagnosis not present

## 2021-05-23 DIAGNOSIS — I739 Peripheral vascular disease, unspecified: Secondary | ICD-10-CM | POA: Diagnosis not present

## 2021-05-23 DIAGNOSIS — I2510 Atherosclerotic heart disease of native coronary artery without angina pectoris: Secondary | ICD-10-CM | POA: Diagnosis not present

## 2021-05-23 DIAGNOSIS — G894 Chronic pain syndrome: Secondary | ICD-10-CM | POA: Diagnosis not present

## 2021-05-24 DIAGNOSIS — I10 Essential (primary) hypertension: Secondary | ICD-10-CM | POA: Diagnosis not present

## 2021-05-24 DIAGNOSIS — Z7902 Long term (current) use of antithrombotics/antiplatelets: Secondary | ICD-10-CM | POA: Diagnosis not present

## 2021-05-24 DIAGNOSIS — Z48812 Encounter for surgical aftercare following surgery on the circulatory system: Secondary | ICD-10-CM | POA: Diagnosis not present

## 2021-05-24 DIAGNOSIS — I739 Peripheral vascular disease, unspecified: Secondary | ICD-10-CM | POA: Diagnosis not present

## 2021-05-24 DIAGNOSIS — I2510 Atherosclerotic heart disease of native coronary artery without angina pectoris: Secondary | ICD-10-CM | POA: Diagnosis not present

## 2021-05-24 DIAGNOSIS — G894 Chronic pain syndrome: Secondary | ICD-10-CM | POA: Diagnosis not present

## 2021-05-28 ENCOUNTER — Other Ambulatory Visit: Payer: Self-pay

## 2021-05-28 MED ORDER — PRAVASTATIN SODIUM 40 MG PO TABS: 40.0000 mg | ORAL_TABLET | Freq: Every day | ORAL | 0 refills | Status: DC

## 2021-05-29 ENCOUNTER — Telehealth: Payer: Self-pay

## 2021-05-29 NOTE — Progress Notes (Signed)
    Chronic Care Management Pharmacy Assistant   Name: William Clay  MRN: [id number] DOB: 11-18-1941   Attempted to reach patient x3  Reason for Encounter: General adherence call  Recent office visits:  04/15/2021: Dr. Tobie Poet (PCP) / Discontinued Citrizine. Discontinued Losartan 100mg .   Recent consult visits:  05/15/2021: Dr. Rosana Hoes @ [facility]/ lower extremity doppler (laterality not noted)   05/18/2021: [name], NP @  [facility]/ for PFT  05/09/2021: Dr. Alvy Bimler,, MD (Oncology) / add aspirin 81 mg. Daily, decrease Pravastatin from 40mg  to 20 mg. daily, discontinued Famotidine,  Glucosamine-Chondroitin, Hydrocodone 5-325 and Valsartan 160mg .  05/04/2021: [name], NP @ [facility] / ordered PFT and oxygen titration walk test  04/18/2021: Dr. Ola Spurr (Cardiology) / no medication changes noted  03/15/2021: Dr. Lyman Speller (Psychology) / chronic pain syndrome/ approval for spinal cord stimulator from psychological perspective.   Hospital visits:  05/01/2021: [facility] / Femoral Artery Endarterectomy    Medications: Outpatient Encounter Medications as of 05/29/2021  Medication Sig   albuterol (PROVENTIL HFA;VENTOLIN HFA) 108 (90 Base) MCG/ACT inhaler Inhale two puffs every four to six hours as needed for cough or wheeze.   amLODipine (NORVASC) 10 MG tablet TAKE 1 TABLET BY MOUTH ONCE DAILY   aspirin EC 81 MG tablet Take 81 mg by mouth daily. Swallow whole.   chlorpheniramine (CHLOR-TRIMETON) 4 MG tablet Take 4 mg by mouth 2 (two) times daily as needed for allergies.   clopidogrel (PLAVIX) 75 MG tablet Take 1 tablet by mouth daily.   losartan (COZAAR) 100 MG tablet Take 1 tablet by mouth daily.   Multiple Vitamins-Minerals (MULTIVITAMIN WITH MINERALS) tablet Take 1 tablet by mouth daily.   Omega-3 1000 MG CAPS Take by mouth.   omega-3 acid ethyl esters (LOVAZA) 1 g capsule Take 2 capsules (2 g total) by mouth 2 (two) times daily.   omeprazole (PRILOSEC) 40 MG capsule Take 1  capsule (40 mg total) by mouth in the morning and at bedtime.   pravastatin (PRAVACHOL) 40 MG tablet Take 1 tablet (40 mg total) by mouth daily.   pregabalin (LYRICA) 50 MG capsule Take 1 capsule (50 mg total) by mouth daily.   Probiotic Product (RESTORA PO) Take 1 tablet by mouth daily.   sertraline (ZOLOFT) 100 MG tablet TAKE 1 TABLET BY MOUTH ONCE DAILY   Tiotropium Bromide-Olodaterol 2.5-2.5 MCG/ACT AERS Inhale 2 puffs into the lungs daily.    No facility-administered encounter medications on file as of 05/29/2021.    Patient is not > 5 days past due for refill on the following medications per chart history:  Star Medications: Medication Name Last Fill Days Supply Pravastatin 40mg .  05/28/2021 90DS Losartan 100mg .  05/07/2021 [place], [place] Clinical Pharmacist Assistant

## 2021-06-08 DIAGNOSIS — Z6827 Body mass index (BMI) 27.0-27.9, adult: Secondary | ICD-10-CM | POA: Diagnosis not present

## 2021-06-08 DIAGNOSIS — M4726 Other spondylosis with radiculopathy, lumbar region: Secondary | ICD-10-CM | POA: Diagnosis not present

## 2021-06-08 DIAGNOSIS — M961 Postlaminectomy syndrome, not elsewhere classified: Secondary | ICD-10-CM | POA: Diagnosis not present

## 2021-06-08 DIAGNOSIS — Z01812 Encounter for preprocedural laboratory examination: Secondary | ICD-10-CM | POA: Diagnosis not present

## 2021-06-08 DIAGNOSIS — G894 Chronic pain syndrome: Secondary | ICD-10-CM | POA: Diagnosis not present

## 2021-06-19 DIAGNOSIS — G894 Chronic pain syndrome: Secondary | ICD-10-CM | POA: Diagnosis not present

## 2021-06-19 DIAGNOSIS — Z79899 Other long term (current) drug therapy: Secondary | ICD-10-CM | POA: Diagnosis not present

## 2021-06-19 DIAGNOSIS — Z6828 Body mass index (BMI) 28.0-28.9, adult: Secondary | ICD-10-CM | POA: Diagnosis not present

## 2021-06-19 DIAGNOSIS — M961 Postlaminectomy syndrome, not elsewhere classified: Secondary | ICD-10-CM | POA: Diagnosis not present

## 2021-06-19 DIAGNOSIS — K219 Gastro-esophageal reflux disease without esophagitis: Secondary | ICD-10-CM | POA: Diagnosis not present

## 2021-06-19 DIAGNOSIS — Z981 Arthrodesis status: Secondary | ICD-10-CM | POA: Diagnosis not present

## 2021-06-19 DIAGNOSIS — E669 Obesity, unspecified: Secondary | ICD-10-CM | POA: Diagnosis not present

## 2021-06-19 DIAGNOSIS — Z9989 Dependence on other enabling machines and devices: Secondary | ICD-10-CM | POA: Diagnosis not present

## 2021-06-19 DIAGNOSIS — J449 Chronic obstructive pulmonary disease, unspecified: Secondary | ICD-10-CM | POA: Diagnosis not present

## 2021-06-19 DIAGNOSIS — Z462 Encounter for fitting and adjustment of other devices related to nervous system and special senses: Secondary | ICD-10-CM | POA: Diagnosis not present

## 2021-06-19 DIAGNOSIS — M545 Low back pain, unspecified: Secondary | ICD-10-CM | POA: Diagnosis not present

## 2021-06-19 DIAGNOSIS — Z87891 Personal history of nicotine dependence: Secondary | ICD-10-CM | POA: Diagnosis not present

## 2021-06-19 DIAGNOSIS — Z969 Presence of functional implant, unspecified: Secondary | ICD-10-CM | POA: Diagnosis not present

## 2021-06-19 DIAGNOSIS — F419 Anxiety disorder, unspecified: Secondary | ICD-10-CM | POA: Diagnosis not present

## 2021-06-19 DIAGNOSIS — I119 Hypertensive heart disease without heart failure: Secondary | ICD-10-CM | POA: Diagnosis not present

## 2021-06-19 DIAGNOSIS — M4726 Other spondylosis with radiculopathy, lumbar region: Secondary | ICD-10-CM | POA: Diagnosis not present

## 2021-06-20 DIAGNOSIS — G894 Chronic pain syndrome: Secondary | ICD-10-CM | POA: Diagnosis not present

## 2021-06-20 DIAGNOSIS — J811 Chronic pulmonary edema: Secondary | ICD-10-CM | POA: Diagnosis not present

## 2021-06-20 DIAGNOSIS — M4726 Other spondylosis with radiculopathy, lumbar region: Secondary | ICD-10-CM | POA: Diagnosis not present

## 2021-06-20 DIAGNOSIS — M47814 Spondylosis without myelopathy or radiculopathy, thoracic region: Secondary | ICD-10-CM | POA: Diagnosis not present

## 2021-06-20 DIAGNOSIS — F419 Anxiety disorder, unspecified: Secondary | ICD-10-CM | POA: Diagnosis not present

## 2021-06-20 DIAGNOSIS — M545 Low back pain, unspecified: Secondary | ICD-10-CM | POA: Diagnosis not present

## 2021-06-20 DIAGNOSIS — I517 Cardiomegaly: Secondary | ICD-10-CM | POA: Diagnosis not present

## 2021-06-20 DIAGNOSIS — R918 Other nonspecific abnormal finding of lung field: Secondary | ICD-10-CM | POA: Diagnosis not present

## 2021-06-20 DIAGNOSIS — M961 Postlaminectomy syndrome, not elsewhere classified: Secondary | ICD-10-CM | POA: Diagnosis not present

## 2021-06-20 DIAGNOSIS — J449 Chronic obstructive pulmonary disease, unspecified: Secondary | ICD-10-CM | POA: Diagnosis not present

## 2021-07-14 ENCOUNTER — Other Ambulatory Visit: Payer: Self-pay | Admitting: Family Medicine

## 2021-07-19 ENCOUNTER — Other Ambulatory Visit: Payer: Medicare Other

## 2021-07-23 ENCOUNTER — Other Ambulatory Visit: Payer: Self-pay

## 2021-07-23 ENCOUNTER — Other Ambulatory Visit: Payer: Medicare Other

## 2021-07-23 DIAGNOSIS — R7302 Impaired glucose tolerance (oral): Secondary | ICD-10-CM

## 2021-07-23 DIAGNOSIS — J302 Other seasonal allergic rhinitis: Secondary | ICD-10-CM

## 2021-07-23 DIAGNOSIS — I10 Essential (primary) hypertension: Secondary | ICD-10-CM

## 2021-07-23 DIAGNOSIS — D72825 Bandemia: Secondary | ICD-10-CM

## 2021-07-23 DIAGNOSIS — M25552 Pain in left hip: Secondary | ICD-10-CM | POA: Diagnosis not present

## 2021-07-23 DIAGNOSIS — M25551 Pain in right hip: Secondary | ICD-10-CM

## 2021-07-23 DIAGNOSIS — J410 Simple chronic bronchitis: Secondary | ICD-10-CM

## 2021-07-23 DIAGNOSIS — E782 Mixed hyperlipidemia: Secondary | ICD-10-CM | POA: Diagnosis not present

## 2021-07-24 ENCOUNTER — Encounter: Payer: Self-pay | Admitting: Family Medicine

## 2021-07-24 ENCOUNTER — Telehealth: Payer: Self-pay | Admitting: Hematology and Oncology

## 2021-07-24 ENCOUNTER — Ambulatory Visit: Payer: Medicare Other | Admitting: Family Medicine

## 2021-07-24 LAB — COMPREHENSIVE METABOLIC PANEL
ALT: 26 IU/L (ref 0–44)
AST: 28 IU/L (ref 0–40)
Albumin/Globulin Ratio: 2.2 (ref 1.2–2.2)
Albumin: 4.6 g/dL (ref 3.7–4.7)
Alkaline Phosphatase: 104 IU/L (ref 44–121)
BUN/Creatinine Ratio: 16 (ref 10–24)
BUN: 15 mg/dL (ref 8–27)
Bilirubin Total: 0.5 mg/dL (ref 0.0–1.2)
CO2: 22 mmol/L (ref 20–29)
Calcium: 9.6 mg/dL (ref 8.6–10.2)
Chloride: 104 mmol/L (ref 96–106)
Creatinine, Ser: 0.95 mg/dL (ref 0.76–1.27)
Globulin, Total: 2.1 g/dL (ref 1.5–4.5)
Glucose: 93 mg/dL (ref 65–99)
Potassium: 4.1 mmol/L (ref 3.5–5.2)
Sodium: 141 mmol/L (ref 134–144)
Total Protein: 6.7 g/dL (ref 6.0–8.5)
eGFR: 82 mL/min/{1.73_m2} (ref >59)

## 2021-07-24 LAB — LIPID PANEL
Chol/HDL Ratio: 4.3 ratio (ref 0.0–5.0)
Cholesterol, Total: 168 mg/dL (ref 100–199)
HDL: 39 mg/dL — ABNORMAL LOW (ref >39)
LDL Chol Calc (NIH): 91 mg/dL (ref 0–99)
Triglycerides: 227 mg/dL — ABNORMAL HIGH (ref 0–149)
VLDL Cholesterol Cal: 38 mg/dL (ref 5–40)

## 2021-07-24 LAB — CBC
Hematocrit: 45.3 % (ref 37.5–51.0)
Hemoglobin: 15 g/dL (ref 13.0–17.7)
MCH: 31.7 pg (ref 26.6–33.0)
MCHC: 33.1 g/dL (ref 31.5–35.7)
MCV: 96 fL (ref 79–97)
Platelets: 235 10*3/uL (ref 150–450)
RBC: 4.73 x10E6/uL (ref 4.14–5.80)
RDW: 13.5 % (ref 11.6–15.4)
WBC: 10.4 10*3/uL (ref 3.4–10.8)

## 2021-07-24 LAB — HEMOGLOBIN A1C
Est. average glucose Bld gHb Est-mCnc: 117 mg/dL
Hgb A1c MFr Bld: 5.7 % — ABNORMAL HIGH (ref 4.8–5.6)

## 2021-07-24 LAB — CARDIOVASCULAR RISK ASSESSMENT

## 2021-07-24 LAB — C-REACTIVE PROTEIN: CRP: 1 mg/L (ref 0–10)

## 2021-07-24 LAB — SEDIMENTATION RATE: Sed Rate: 13 mm/hr (ref 0–30)

## 2021-07-24 NOTE — Telephone Encounter (Signed)
I reviewed his blood test results and talk to the patient over the phone His leukocytosis, sedimentation rate and CRP were all normal I suspect his recent leukocytosis was due to flare of allergic rhinitis He does not need long-term hematology follow-up I have addressed all his questions

## 2021-07-26 DIAGNOSIS — G894 Chronic pain syndrome: Secondary | ICD-10-CM | POA: Diagnosis not present

## 2021-07-27 ENCOUNTER — Encounter: Payer: Self-pay | Admitting: Nurse Practitioner

## 2021-07-27 ENCOUNTER — Ambulatory Visit (INDEPENDENT_AMBULATORY_CARE_PROVIDER_SITE_OTHER): Payer: Medicare Other | Admitting: Nurse Practitioner

## 2021-07-27 ENCOUNTER — Ambulatory Visit: Payer: Medicare Other | Admitting: Family Medicine

## 2021-07-27 VITALS — BP 124/62 | HR 68 | Temp 97.8°F | Ht 69.0 in | Wt 189.0 lb

## 2021-07-27 DIAGNOSIS — E782 Mixed hyperlipidemia: Secondary | ICD-10-CM | POA: Diagnosis not present

## 2021-07-27 DIAGNOSIS — I10 Essential (primary) hypertension: Secondary | ICD-10-CM

## 2021-07-27 DIAGNOSIS — I70219 Atherosclerosis of native arteries of extremities with intermittent claudication, unspecified extremity: Secondary | ICD-10-CM

## 2021-07-27 DIAGNOSIS — J449 Chronic obstructive pulmonary disease, unspecified: Secondary | ICD-10-CM

## 2021-07-27 NOTE — Progress Notes (Signed)
Subjective:  Patient ID: William Clay, male    DOB: 01-05-1941  Age: 80 y.o. MRN: [id number]  Chief Complaint  Patient presents with   Hypertension   Hyperlipidemia    Hypertension Pertinent negatives include no chest pain, headaches or shortness of breath.  Hyperlipidemia Pertinent negatives include no chest pain, myalgias or shortness of breath.    Current Outpatient Medications on File Prior to Visit  Medication Sig Dispense Refill   albuterol (PROVENTIL HFA;VENTOLIN HFA) 108 (90 Base) MCG/ACT inhaler Inhale two puffs every four to six hours as needed for cough or wheeze. 1 Inhaler 1   amLODipine (NORVASC) 10 MG tablet TAKE 1 TABLET BY MOUTH ONCE DAILY 90 tablet 1   aspirin EC 81 MG tablet Take 81 mg by mouth daily. Swallow whole.     chlorpheniramine (CHLOR-TRIMETON) 4 MG tablet Take 4 mg by mouth 2 (two) times daily as needed for allergies.     clopidogrel (PLAVIX) 75 MG tablet Take 1 tablet by mouth daily.     losartan (COZAAR) 100 MG tablet Take 1 tablet by mouth daily.     Multiple Vitamins-Minerals (MULTIVITAMIN WITH MINERALS) tablet Take 1 tablet by mouth daily.     Omega-3 1000 MG CAPS Take by mouth.     omega-3 acid ethyl esters (LOVAZA) 1 g capsule Take 2 capsules (2 g total) by mouth 2 (two) times daily. 360 capsule 0   omeprazole (PRILOSEC) 40 MG capsule TAKE 1 CAPSULE(40 MG) BY MOUTH IN THE MORNING AND AT BEDTIME 180 capsule 1   pravastatin (PRAVACHOL) 40 MG tablet Take 1 tablet (40 mg total) by mouth daily. 90 tablet 0   sertraline (ZOLOFT) 100 MG tablet TAKE 1 TABLET BY MOUTH ONCE DAILY 90 tablet 3   Tiotropium Bromide-Olodaterol 2.5-2.5 MCG/ACT AERS Inhale 2 puffs into the lungs daily.      No current facility-administered medications on file prior to visit.   Past Medical History:  Diagnosis Date   Atherosclerosis of aorta (HCC)    Bowel obstruction ([place]) 2018   BPH (benign prostatic hyperplasia)    COPD (chronic obstructive pulmonary disease) (HCC)     COPD (chronic obstructive pulmonary disease) (HCC)    GERD (gastroesophageal reflux disease)    Hypertension    Hypoxemia    Lumbar stenosis with neurogenic claudication 04/24/2017   Mixed hyperlipidemia    Osteoarthritis    Pneumonia 2016,2017   Primary insomnia    Sleep apnea    Spontaneous ecchymoses    Past Surgical History:  Procedure Laterality Date   ABDOMINAL AORTIC ANEURYSM REPAIR     BACK SURGERY N/A [phone], 2021   Lower back and cervical spine   BACK SURGERY  1984   upper back   CATARACT EXTRACTION Left 2017   CATARACT EXTRACTION Right 2017   CHOLECYSTECTOMY  2018   HERNIA REPAIR  2018   PROSTATECTOMY     ROTATOR CUFF REPAIR Right 2015   ROTATOR CUFF REPAIR Left 2017    Family History  Problem Relation Age of Onset   Heart failure Mother    Diabetes Mother    CAD Mother    Hypertension Mother    Social History   Socioeconomic History   Marital status: Married    Spouse name: Vaughan Basta   Number of children: 2   Years of education: Not on file   Highest education level: Not on file  Occupational History   Occupation: retired    Comment: Event organiser  Tobacco Use  Smoking status: Former    Packs/day: 2.00    Years: 50.00    Pack years: 100.00    Types: Cigarettes    Quit date: 12/24/2007    Years since quitting: 13.6   Smokeless tobacco: Never  Vaping Use   Vaping Use: Never used  Substance and Sexual Activity   Alcohol use: No    Alcohol/week: 0.0 standard drinks   Drug use: No   Sexual activity: Not on file  Other Topics Concern   Not on file  Social History Narrative   Not on file   Social Determinants of Health   Financial Resource Strain: Not on file  Food Insecurity: Not on file  Transportation Needs: Not on file  Physical Activity: Not on file  Stress: Not on file  Social Connections: Not on file    Review of Systems  Constitutional:  Negative for appetite change, fatigue and fever.  HENT:  Negative  for congestion, ear pain and sore throat.   Respiratory:  Negative for cough and shortness of breath.   Cardiovascular:  Negative for chest pain and leg swelling.  Gastrointestinal:  Negative for abdominal pain, constipation, diarrhea, nausea and vomiting.  Genitourinary:  Negative for dysuria and frequency.  Musculoskeletal:  Negative for arthralgias and myalgias.  Neurological:  Negative for dizziness and headaches.  Psychiatric/Behavioral:  Negative for dysphoric mood. The patient is not nervous/anxious.     Objective:  BP 124/62 (BP Location: Left Arm, Patient Position: Sitting)   Pulse 68   Temp 97.8 F (36.6 C) (Temporal)   Ht '5\' 9"'$  (1.753 m)   Wt 189 lb (85.7 kg)   SpO2 96%   BMI 27.91 kg/m   BP/Weight 07/27/2021 05/09/2021 0000000  Systolic BP [id number] 0000000 [id number]  Diastolic BP 62 85 70  Wt. (Lbs) 189 189.25 193.4  BMI 27.91 27.95 28.56    Physical Exam Vitals reviewed.  Constitutional:      Appearance: Normal appearance.  HENT:     Right Ear: Tympanic membrane and external ear normal.     Left Ear: Tympanic membrane and external ear normal.     Nose: Nose normal.     Mouth/Throat:     Mouth: Mucous membranes are moist.  Eyes:     Pupils: Pupils are equal, round, and reactive to light.  Cardiovascular:     Rate and Rhythm: Normal rate and regular rhythm.     Pulses: Normal pulses.     Heart sounds: Normal heart sounds.  Pulmonary:     Breath sounds: Normal breath sounds.  Abdominal:     General: Abdomen is flat. Bowel sounds are normal.     Palpations: Abdomen is soft.  Musculoskeletal:        General: Normal range of motion.     Cervical back: Normal range of motion.  Skin:    General: Skin is warm and dry.  Neurological:     Mental Status: He is alert and oriented to person, place, and time.  Psychiatric:        Mood and Affect: Mood normal.        Behavior: Behavior normal.    Diabetic Foot Exam - Simple   No data filed      Lab Results  Component  Value Date   WBC 10.4 07/23/2021   HGB 15.0 07/23/2021   HCT 45.3 07/23/2021   PLT 235 07/23/2021   GLUCOSE 93 07/23/2021   CHOL 168 07/23/2021   TRIG 227 (H) 07/23/2021  HDL 39 (L) 07/23/2021   LDLCALC 91 07/23/2021   ALT 26 07/23/2021   AST 28 07/23/2021   NA 141 07/23/2021   K 4.1 07/23/2021   CL 104 07/23/2021   CREATININE 0.95 07/23/2021   BUN 15 07/23/2021   CO2 22 07/23/2021   TSH 1.340 04/13/2021   HGBA1C 5.7 (H) 07/23/2021      Assessment & Plan:   1. Essential hypertension  2. Mixed hyperlipidemia  3. Impaired glucose tolerance    No orders of the defined types were placed in this encounter.   No orders of the defined types were placed in this encounter.    Follow-up: No follow-ups on file.  An After Visit Summary was printed and given to the patient.     I,Laycee Fitzsimmons M [place],acting as a Education administrator for [employer], NP.,have documented all relevant documentation on the behalf of Rip Harbour, NP,as directed by  Rip Harbour, NP while in the presence of Rip Harbour, NP.   Rip Harbour, NP [place] [phone]

## 2021-07-27 NOTE — Progress Notes (Signed)
Established Patient Office Visit  Subjective:  Patient ID: William Clay, male    DOB: 10-Feb-1941  Age: 80 y.o. MRN: [id number]  CC: COPD         Hyperlipidmia  HPI William Clay presents for follow-up of COPD, hyperlipidemia, hypertension, and GERD. He tells me he recently had a left femoral artery bypass and neuro-stimulator inserted in his back. States he is doing well.   COPD, Follow up [place] states that he [facility]. He tells me that he is compliant with daily inhaler use. Denies exacerbations or hospital visits due to COPD. States uses albuterol only on occasion.  He was last seen for this 3 months ago.     Current treatment includes Stiolto Respimat and Albuterol inhaler.   He reports excellent compliance with treatment. He is not having side effects.  he uses rescue inhaler 3 per months. He IS experiencing dyspnea. He is NOT experiencing wheezing. he reports breathing is Unchanged.  Pulmonary Functions Testing Results:  TLC  Date Value Ref Range Status  04/26/2015 7.10 L Final   Hypertension, follow-up [place] has a past history of hypertension for several years.     He was last seen for hypertension 3 months ago.  BP at that visit was 134/70. Management since that visit includes Cozaar 100 mg daily.  He reports excellent compliance with treatment. He is not having side effects.  He is following a Low Sodium diet. He is exercising. He does not smoke.  Use of agents associated with hypertension: NSAIDS.   Outside blood pressures are not being checked. Symptoms: No chest pain No chest pressure  No palpitations No syncope  No dyspnea No orthopnea  No paroxysmal nocturnal dyspnea No lower extremity edema   Pertinent labs: Lab Results  Component Value Date   CHOL 168 07/23/2021   HDL 39 (L) 07/23/2021   LDLCALC 91 07/23/2021   TRIG 227 (H) 07/23/2021   CHOLHDL 4.3 07/23/2021   Lab Results  Component Value Date    NA 141 07/23/2021   K 4.1 07/23/2021   CREATININE 0.95 07/23/2021   GFRNONAA 73 01/03/2021   GFRAA 85 01/03/2021   GLUCOSE 93 07/23/2021     The 10-year ASCVD risk score Mikey Bussing DC Jr., et al., 2013) is: 32%   Lipid/Cholesterol, Follow-up  Last lipid panel Other pertinent labs  Lab Results  Component Value Date   CHOL 168 07/23/2021   HDL 39 (L) 07/23/2021   LDLCALC 91 07/23/2021   TRIG 227 (H) 07/23/2021   CHOLHDL 4.3 07/23/2021   Lab Results  Component Value Date   ALT 26 07/23/2021   AST 28 07/23/2021   PLT 235 07/23/2021   TSH 1.340 04/13/2021     He was last seen for this 3 months ago.  Management since that visit includes  Lovaza 2gm BID and Pravachol 40 mg .  He reports excellent compliance with treatment. He is not having side effects.   Symptoms: No chest pain No chest pressure/discomfort  Yes dyspnea-chronic No lower extremity edema  No numbness or tingling of extremity No orthopnea  No palpitations No paroxysmal nocturnal dyspnea  No speech difficulty No syncope   Current diet: in general, a "healthy" diet   Current exercise: walking  The 10-year ASCVD risk score Mikey Bussing DC Jr., et al., 2013) is: 32%   GERD, Follow up:  The patient was last seen for GERD 3 months ago.  Treatment includes  Prilosec 40 mg daily          He reports good compliance with treatment. He is not having side effects.Marland Kitchen He is NOT experiencing belching and eructation   Past Medical History:  Diagnosis Date   Atherosclerosis of aorta (HCC)    Bowel obstruction ([place]) 2018   BPH (benign prostatic hyperplasia)    COPD (chronic obstructive pulmonary disease) (HCC)    COPD (chronic obstructive pulmonary disease) (HCC)    GERD (gastroesophageal reflux disease)    Hypertension    Hypoxemia    Lumbar stenosis with neurogenic claudication 04/24/2017   Mixed hyperlipidemia    Osteoarthritis    Pneumonia 2016,2017   Primary insomnia    Sleep apnea    Spontaneous ecchymoses     Past  Surgical History:  Procedure Laterality Date   ABDOMINAL AORTIC ANEURYSM REPAIR     BACK SURGERY N/A [phone], 2021   Lower back and cervical spine   BACK SURGERY  1984   upper back   CATARACT EXTRACTION Left 2017   CATARACT EXTRACTION Right 2017   CHOLECYSTECTOMY  2018   HERNIA REPAIR  2018   PROSTATECTOMY     ROTATOR CUFF REPAIR Right 2015   ROTATOR CUFF REPAIR Left 2017    Family History  Problem Relation Age of Onset   Heart failure Mother    Diabetes Mother    CAD Mother    Hypertension Mother     Social History   Socioeconomic History   Marital status: Married    Spouse name: Vaughan Basta   Number of children: 2   Years of education: Not on file   Highest education level: Not on file  Occupational History   Occupation: retired    Comment: Event organiser  Tobacco Use   Smoking status: Former    Packs/day: 2.00    Years: 50.00    Pack years: 100.00    Types: Cigarettes    Quit date: 12/24/2007    Years since quitting: 13.6   Smokeless tobacco: Never  Vaping Use   Vaping Use: Never used  Substance and Sexual Activity   Alcohol use: No    Alcohol/week: 0.0 standard drinks   Drug use: No   Sexual activity: Not on file  Other Topics Concern   Not on file  Social History Narrative   Not on file   Social Determinants of Health   Financial Resource Strain: Not on file  Food Insecurity: Not on file  Transportation Needs: Not on file  Physical Activity: Not on file  Stress: Not on file  Social Connections: Not on file  Intimate Partner Violence: Not on file    Outpatient Medications Prior to Visit  Medication Sig Dispense Refill   albuterol (PROVENTIL HFA;VENTOLIN HFA) 108 (90 Base) MCG/ACT inhaler Inhale two puffs every four to six hours as needed for cough or wheeze. 1 Inhaler 1   amLODipine (NORVASC) 10 MG tablet TAKE 1 TABLET BY MOUTH ONCE DAILY 90 tablet 1   aspirin EC 81 MG tablet Take 81 mg by mouth daily. Swallow whole.      chlorpheniramine (CHLOR-TRIMETON) 4 MG tablet Take 4 mg by mouth 2 (two) times daily as needed for allergies.     clopidogrel (PLAVIX) 75 MG tablet Take 1 tablet by mouth daily.     losartan (COZAAR) 100 MG tablet Take 1 tablet by mouth daily.     Multiple Vitamins-Minerals (MULTIVITAMIN WITH MINERALS) tablet Take 1 tablet by mouth daily.  Omega-3 1000 MG CAPS Take by mouth.     omega-3 acid ethyl esters (LOVAZA) 1 g capsule Take 2 capsules (2 g total) by mouth 2 (two) times daily. 360 capsule 0   omeprazole (PRILOSEC) 40 MG capsule TAKE 1 CAPSULE(40 MG) BY MOUTH IN THE MORNING AND AT BEDTIME 180 capsule 1   pravastatin (PRAVACHOL) 40 MG tablet Take 1 tablet (40 mg total) by mouth daily. 90 tablet 0   sertraline (ZOLOFT) 100 MG tablet TAKE 1 TABLET BY MOUTH ONCE DAILY 90 tablet 3   Tiotropium Bromide-Olodaterol 2.5-2.5 MCG/ACT AERS Inhale 2 puffs into the lungs daily.      pregabalin (LYRICA) 50 MG capsule Take 1 capsule (50 mg total) by mouth daily. 30 capsule 2   Probiotic Product (RESTORA PO) Take 1 tablet by mouth daily.     No facility-administered medications prior to visit.    Allergies  Allergen Reactions   Cefadroxil Shortness Of Breath   Lisinopril Other (See Comments)    Dyspnea Dyspnea    Simvastatin Other (See Comments)    Muscle Cramps Muscle Cramps    Amoxicillin     Mouth sores    Gabapentin    Metoprolol Other (See Comments)    Leg cramps   Potassium Clavulanate [Clavulanic Acid]     diarrhea    ROS Review of Systems  Constitutional:  Negative for appetite change, fatigue and unexpected weight change.  HENT:  Positive for hearing loss (wears hearing aids). Negative for congestion, ear pain, rhinorrhea, sinus pressure, sinus pain and tinnitus.   Eyes:  Negative for pain.  Respiratory:  Positive for shortness of breath (-with activity,COPD). Negative for cough.   Cardiovascular:  Negative for chest pain, palpitations and leg swelling.  Gastrointestinal:   Negative for abdominal pain, constipation, diarrhea, nausea and vomiting.  Endocrine: Negative for cold intolerance, heat intolerance, polydipsia, polyphagia and polyuria.  Genitourinary:  Negative for dysuria, frequency and hematuria.  Musculoskeletal:  Negative for arthralgias, back pain, joint swelling and myalgias.  Skin:  Negative for rash.  Allergic/Immunologic: Negative for environmental allergies.  Neurological:  Negative for dizziness and headaches.  Hematological:  Negative for adenopathy.  Psychiatric/Behavioral:  Negative for decreased concentration and sleep disturbance. The patient is not nervous/anxious.      Objective:    Physical Exam Vitals reviewed.  Constitutional:      Appearance: Normal appearance.  HENT:     Head: Normocephalic.     Left Ear: Tympanic membrane normal.     Ears:     Comments: Hearing aid in place to right ear    Mouth/Throat:     Mouth: Mucous membranes are moist.  Cardiovascular:     Rate and Rhythm: Normal rate and regular rhythm.     Pulses: Normal pulses.     Heart sounds: Normal heart sounds.  Pulmonary:     Effort: Pulmonary effort is normal.     Breath sounds: Examination of the right-upper field reveals decreased breath sounds. Examination of the left-upper field reveals decreased breath sounds. Examination of the right-middle field reveals decreased breath sounds. Examination of the left-middle field reveals decreased breath sounds. Examination of the right-lower field reveals decreased breath sounds. Examination of the left-lower field reveals decreased breath sounds. Decreased breath sounds present.  Abdominal:     General: Bowel sounds are normal.     Palpations: Abdomen is soft.  Skin:    General: Skin is warm and dry.     Capillary Refill: Capillary refill takes less  than 2 seconds.  Neurological:     General: No focal deficit present.     Mental Status: He is alert and oriented to person, place, and time.  Psychiatric:         Mood and Affect: Mood normal.        Behavior: Behavior normal.    BP 124/62 (BP Location: Left Arm, Patient Position: Sitting)   Pulse 68   Temp 97.8 F (36.6 C) (Temporal)   Ht _0  (1.753 m)   Wt 189 lb (85.7 kg)   SpO2 96%   BMI 27.91 kg/m   Wt Readings from Last 3 Encounters:  05/09/21 189 lb 4 oz (85.8 kg)  04/18/21 193 lb 6.4 oz (87.7 kg)  01/05/21 196 lb (88.9 kg)     Health Maintenance Due  Topic Date Due   Hepatitis C Screening  Never done   Zoster Vaccines- Shingrix (1 of 2) Never done   INFLUENZA VACCINE  07/23/2021      Lab Results  Component Value Date   TSH 1.340 04/13/2021   Lab Results  Component Value Date   WBC 10.4 07/23/2021   HGB 15.0 07/23/2021   HCT 45.3 07/23/2021   MCV 96 07/23/2021   PLT 235 07/23/2021   Lab Results  Component Value Date   NA 141 07/23/2021   K 4.1 07/23/2021   CO2 22 07/23/2021   GLUCOSE 93 07/23/2021   BUN 15 07/23/2021   CREATININE 0.95 07/23/2021   BILITOT 0.5 07/23/2021   ALKPHOS 104 07/23/2021   AST 28 07/23/2021   ALT 26 07/23/2021   PROT 6.7 07/23/2021   ALBUMIN 4.6 07/23/2021   CALCIUM 9.6 07/23/2021   EGFR 82 07/23/2021   Lab Results  Component Value Date   CHOL 168 07/23/2021   Lab Results  Component Value Date   HDL 39 (L) 07/23/2021   Lab Results  Component Value Date   LDLCALC 91 07/23/2021   Lab Results  Component Value Date   TRIG 227 (H) 07/23/2021   Lab Results  Component Value Date   CHOLHDL 4.3 07/23/2021   Lab Results  Component Value Date   HGBA1C 5.7 (H) 07/23/2021      Assessment & Plan:   1. Chronic obstructive pulmonary disease, unspecified COPD type (HCC)-well-controlled  2. Essential hypertension-well-controlled -continue Cozaar 100 mg BID -continue Norvasc 10 mg daily  3. Mixed hyperlipidemia-not at goal -continue Lovaza 2 gm BID -continue Pravachol 40 mg daily -heart healthy diet, decrease sugar intake  4. Extremity atherosclerosis with  intermittent claudication -continue Plavix 75 mg daily -continue ASA 81 mg daily   Follow-up: 22-month, fasting    I, SRip Harbour NP, have reviewed all documentation for this visit. The documentation on 07/27/21 for the exam, diagnosis, procedures, and orders are all accurate and complete.    Signed, SRip Harbour NP 07/27/21 at 9:08 am

## 2021-07-27 NOTE — Patient Instructions (Signed)
Continue medications Follow-up   COPD and Physical Activity Chronic obstructive pulmonary disease (COPD) is a long-term, or chronic, condition that affects the lungs. COPD is a general term that can be used to describe many problems that cause inflammation of the lungs and limit airflow.These conditions include chronic bronchitis and emphysema. The main symptom of COPD is shortness of breath, which makes it harder to do even simple tasks. This can also make it harder to exercise and stay active. Talk with your health care provider about treatments to help you breathe betterand actions you can take to prevent breathing problems during physical activity. What are the benefits of exercising when you have COPD? Exercising regularly is an important part of a healthy lifestyle. You can still exercise and do physical activities even though you have COPD. Exercise and physical activity improve your shortness of breath by increasing blood flow (circulation). This causes your heart to pump more oxygen through your body. Moderate exercise can: Improve oxygen use. Increase your energy level. Help with shortness of breath. Strengthen your breathing muscles. Improve heart health. Help with sleep. Improve your self-esteem and feelings of self-worth. Lower depression, stress, and anxiety. Exercise can benefit everyone with COPD. The severity of your disease may affect how hard you can exercise, especially at first, but everyone can benefit. Talk with your health care provider about how much exercise is safefor you, and which activities and exercises are safe for you. What actions can I take to prevent breathing problems during physical activity? Sign up for a pulmonary rehabilitation program. This type of program may include: Education about lung diseases. Exercise classes that teach you how to exercise and be more active while improving your breathing. This usually involves: Exercise using your lower  extremities, such as a stationary bicycle. About 30 minutes of exercise, 2 to 5 times per week, for 6 to 12 weeks. Strength training, such as push-ups or leg lifts. Nutrition education. Group classes in which you can talk with others who also have COPD and learn ways to manage stress. If you use an oxygen tank, you should use it while you exercise. Work with your health care provider to adjust your oxygen for your physical activity. Your resting flow rate is different from your flow rate during physical activity. How to manage your breathing while exercising While you are exercising: Take slow breaths. Pace yourself, and do nottry to go too fast. Purse your lips while breathing out. Pursing your lips is similar to a kissing or whistling position. If doing exercise that uses a quick burst of effort, such as weight lifting: Breathe in before starting the exercise. Breathe out during the hardest part of the exercise, such as raising the weights. Where to find support You can find support for exercising with COPD from: Your health care provider. A pulmonary rehabilitation program. Your local health department or community health programs. Support groups, either online or in-person. Your health care provider may be able to recommend support groups. Where to find more information You can find more information about exercising with COPD from: American Lung Association: lung.org COPD Foundation: copdfoundation.org Contact a health care provider if: Your symptoms get worse. You have nausea. You have a fever. You want to start a new exercise program or a new activity. Get help right away if: You have chest pain. You cannot breathe. These symptoms may represent a serious problem that is an emergency. Do not wait to see if the symptoms will go away. Get medical help right  away. Call your local emergency services (911 in the [place] Do not drive yourself to the hospital. Summary COPD is a general  term that can be used to describe many different lung problems that cause lung inflammation and limit airflow. This includes chronic bronchitis and emphysema. Exercise and physical activity improve your shortness of breath by increasing blood flow (circulation). This causes your heart to provide more oxygen to your body. Contact your health care provider before starting any exercise program or new activity. Ask your health care provider what exercises and activities are safe for you. This information is not intended to replace advice given to you by your health care provider. Make sure you discuss any questions you have with your healthcare provider. Document Revised: 10/17/2020 Document Reviewed: 10/17/2020 Elsevier Patient Education  2022 [employer].

## 2021-08-01 ENCOUNTER — Other Ambulatory Visit: Payer: Self-pay | Admitting: Family Medicine

## 2021-08-01 NOTE — Telephone Encounter (Signed)
Refill sent to pharmacy.   

## 2021-08-08 DIAGNOSIS — L570 Actinic keratosis: Secondary | ICD-10-CM | POA: Diagnosis not present

## 2021-08-08 DIAGNOSIS — R233 Spontaneous ecchymoses: Secondary | ICD-10-CM | POA: Diagnosis not present

## 2021-08-08 DIAGNOSIS — L578 Other skin changes due to chronic exposure to nonionizing radiation: Secondary | ICD-10-CM | POA: Diagnosis not present

## 2021-08-08 DIAGNOSIS — L853 Xerosis cutis: Secondary | ICD-10-CM | POA: Diagnosis not present

## 2021-09-04 ENCOUNTER — Telehealth: Payer: Medicare Other

## 2021-09-12 ENCOUNTER — Telehealth: Payer: Self-pay

## 2021-09-12 NOTE — Chronic Care Management (AMB) (Signed)
Chronic Care Management Pharmacy Assistant   Name: William Clay  MRN: [id number] DOB: [birth year]   Reason for Encounter: Disease State call for HTN   Recent office visits:  07/27/21 William Belfast NP. Seen for COPD. D/C Pregablin 50 mg daily and Probiotic daily.  04/15/2021: Dr. Tobie Clay (PCP) / Discontinued Citrizine. Discontinued Losartan 100mg .   Recent consult visits:  05/15/2021: Dr. Rosana Clay @ [facility] V[facility] Surgery/ lower extremity doppler (laterality not noted)    05/18/2021: William Grandchild, NP @  [facility]/ for PFT   05/09/2021: Dr. Alvy Clay,, MD (Oncology) / add aspirin 81 mg. Daily, decrease Pravastatin from 40mg  to 20 mg. daily, discontinued Famotidine,  Glucosamine-Chondroitin, Hydrocodone 5-325 and Valsartan 160mg .   05/04/2021: William Grandchild, NP @ [facility] / ordered PFT and oxygen titration walk test   04/18/2021: Dr. Ola Clay (Cardiology) / no medication changes noted   03/15/2021: Dr. Lyman Clay (Psychology) / chronic pain syndrome/ approval for spinal cord stimulator from psychological perspective.   Hospital visits:  05/01/2021: [facility] ([place]) / Femoral Artery Endarterectomy   Medications: Outpatient Encounter Medications as of 09/12/2021  Medication Sig   albuterol (PROVENTIL HFA;VENTOLIN HFA) 108 (90 Base) MCG/ACT inhaler Inhale two puffs every four to six hours as needed for cough or wheeze.   amLODipine (NORVASC) 10 MG tablet TAKE 1 TABLET BY MOUTH EVERY DAY   aspirin EC 81 MG tablet Take 81 mg by mouth daily. Swallow whole.   chlorpheniramine (CHLOR-TRIMETON) 4 MG tablet Take 4 mg by mouth 2 (two) times daily as needed for allergies.   clopidogrel (PLAVIX) 75 MG tablet Take 1 tablet by mouth daily.   losartan (COZAAR) 100 MG tablet Take 1 tablet by mouth daily.   Multiple Vitamins-Minerals (MULTIVITAMIN WITH MINERALS) tablet Take 1 tablet by mouth daily.   Omega-3 1000 MG CAPS Take by mouth.   omega-3 acid ethyl esters (LOVAZA) 1 g capsule Take 2 capsules (2 g total) by mouth 2  (two) times daily.   omeprazole (PRILOSEC) 40 MG capsule TAKE 1 CAPSULE(40 MG) BY MOUTH IN THE MORNING AND AT BEDTIME   pravastatin (PRAVACHOL) 40 MG tablet Take 1 tablet (40 mg total) by mouth daily.   sertraline (ZOLOFT) 100 MG tablet TAKE 1 TABLET BY MOUTH ONCE DAILY   Tiotropium Bromide-Olodaterol 2.5-2.5 MCG/ACT AERS Inhale 2 puffs into the lungs daily.    No facility-administered encounter medications on file as of 09/12/2021.     Recent Office Vitals: BP Readings from Last 3 Encounters:  07/27/21 124/62  05/09/21 125/85  04/18/21 134/70   Pulse Readings from Last 3 Encounters:  07/27/21 68  05/09/21 75  04/18/21 84    Wt Readings from Last 3 Encounters:  07/27/21 189 lb (85.7 kg)  05/09/21 189 lb 4 oz (85.8 kg)  04/18/21 193 lb 6.4 oz (87.7 kg)     Kidney Function Lab Results  Component Value Date/Time   CREATININE 0.95 07/23/2021 01:30 PM   CREATININE 0.81 04/13/2021 09:23 AM   GFRNONAA 73 01/03/2021 07:45 AM   GFRAA 85 01/03/2021 07:45 AM    BMP Latest Ref Rng & Units 07/23/2021 04/13/2021 01/03/2021  Glucose 65 - 99 mg/dL 93 113(H) 85  BUN 8 - 27 mg/dL 15 14 19   Creatinine 0.76 - 1.27 mg/dL 0.95 0.81 0.99  BUN/Creat Ratio 10 - 24 16 17 19   Sodium 134 - 144 mmol/L 141 140 138  Potassium 3.5 - 5.2 mmol/L 4.1 3.7 4.0  Chloride 96 - 106 mmol/L 104 103 101  CO2 20 - 29  mmol/L 22 20 23   Calcium 8.6 - 10.2 mg/dL 9.6 9.9 9.9     Current antihypertensive regimen:  Amlodipine 10 mg daily Losartan 100 mg daily   Patient verbally confirms he is taking the above medications as directed. Yes  How often are you checking your Blood Pressure? infrequently  Current home BP readings: Last time he checked 120/80 and that was taking about 3 weeks ago   Wrist or arm cuff:Uses arm cuff Caffeine intake: Drinks a cup of coffee daily Salt intake:Pt states he is trying to cut back and not use as much OTC medications including pseudoephedrine or NSAIDs? No  Any readings above  180/120? No  What recent interventions/DTPs have been made by any provider to improve Blood Pressure control since last CPP Visit: Pt stated no changes have been made  Any recent hospitalizations or ED visits since last visit with CPP? Yes, he recently had to an Laminectomy and insertion of internal pulse generator  What diet changes have been made to improve Blood Pressure Control?  Pt states he eat what he wants   What exercise is being done to improve your Blood Pressure Control?  Pt does yard work and gets good exercise  Adherence Review: Is the patient currently on ACE/ARB medication? Yes Does the patient have >5 day gap between last estimated fill dates? CPP to review  Care Gaps: Last annual wellness visit: None Noted  Star Rating Drugs:  Medication:  Last Fill: Day Supply  Pravastatin 40mg .     05/28/2021      90DS Losartan 100mg .       08/17/2021        [name]

## 2021-09-14 DIAGNOSIS — H26493 Other secondary cataract, bilateral: Secondary | ICD-10-CM | POA: Diagnosis not present

## 2021-10-01 ENCOUNTER — Telehealth: Payer: Self-pay

## 2021-10-01 ENCOUNTER — Telehealth: Payer: Medicare Other

## 2021-10-01 NOTE — Telephone Encounter (Signed)
Patient called to cancel the appointment. He would like to have the appointment not every 3 weeks. Please call patient to reschedule.

## 2021-10-04 ENCOUNTER — Telehealth: Payer: Self-pay

## 2021-10-04 NOTE — Chronic Care Management (AMB) (Signed)
Called pt to r/s f/u with CPP and he did not want to reschedule at this time. He stated he goes to the [place] on 10/24/21 and getting phones calls every month is irritating him. I informed him that we can offer giving him a call every few months instead of every month and I confirmed we are just here to help. Pt wants to think about it and talk to the [place] first and see what he wants to do.   William Clay, [place] Pharmacist Assistant  [phone]

## 2021-10-15 ENCOUNTER — Other Ambulatory Visit: Payer: Self-pay | Admitting: Family Medicine

## 2021-10-23 DIAGNOSIS — T162XXA Foreign body in left ear, initial encounter: Secondary | ICD-10-CM | POA: Diagnosis not present

## 2021-10-30 ENCOUNTER — Telehealth: Payer: Self-pay | Admitting: Family Medicine

## 2021-10-30 ENCOUNTER — Other Ambulatory Visit: Payer: Self-pay

## 2021-10-30 ENCOUNTER — Encounter: Payer: Self-pay | Admitting: Family Medicine

## 2021-10-30 ENCOUNTER — Ambulatory Visit (INDEPENDENT_AMBULATORY_CARE_PROVIDER_SITE_OTHER): Payer: Medicare Other | Admitting: Family Medicine

## 2021-10-30 VITALS — BP 120/70 | HR 84 | Temp 97.2°F | Resp 18 | Ht 69.0 in | Wt 185.0 lb

## 2021-10-30 DIAGNOSIS — I700 Atherosclerosis of aorta: Secondary | ICD-10-CM | POA: Diagnosis not present

## 2021-10-30 DIAGNOSIS — J9611 Chronic respiratory failure with hypoxia: Secondary | ICD-10-CM

## 2021-10-30 DIAGNOSIS — J410 Simple chronic bronchitis: Secondary | ICD-10-CM | POA: Diagnosis not present

## 2021-10-30 DIAGNOSIS — G4733 Obstructive sleep apnea (adult) (pediatric): Secondary | ICD-10-CM

## 2021-10-30 DIAGNOSIS — F330 Major depressive disorder, recurrent, mild: Secondary | ICD-10-CM | POA: Diagnosis not present

## 2021-10-30 DIAGNOSIS — Z9989 Dependence on other enabling machines and devices: Secondary | ICD-10-CM

## 2021-10-30 DIAGNOSIS — E782 Mixed hyperlipidemia: Secondary | ICD-10-CM | POA: Diagnosis not present

## 2021-10-30 DIAGNOSIS — I739 Peripheral vascular disease, unspecified: Secondary | ICD-10-CM | POA: Diagnosis not present

## 2021-10-30 DIAGNOSIS — Z23 Encounter for immunization: Secondary | ICD-10-CM

## 2021-10-30 DIAGNOSIS — K219 Gastro-esophageal reflux disease without esophagitis: Secondary | ICD-10-CM

## 2021-10-30 DIAGNOSIS — Z9981 Dependence on supplemental oxygen: Secondary | ICD-10-CM

## 2021-10-30 DIAGNOSIS — R7302 Impaired glucose tolerance (oral): Secondary | ICD-10-CM

## 2021-10-30 DIAGNOSIS — I10 Essential (primary) hypertension: Secondary | ICD-10-CM | POA: Diagnosis not present

## 2021-10-30 NOTE — Telephone Encounter (Signed)
Patient calling with information about the [place] [phone], fax # [phone]. Primary doctor at [facility] is Dr. Delorise Shiner # [phone] has an appt with her on 11/13/21. Also please mail lab results from today.

## 2021-10-30 NOTE — Progress Notes (Signed)
a  Subjective:  Patient ID: William Clay, male    DOB: 09/27/1941  Age: 80 y.o. MRN: [id number]  Chief Complaint  Patient presents with   Hyperlipidemia   Hypertension    HPI Pt fell twice last week. One immediately after the first fall. Missed step going up stairs and then tried to get up and fell again. Bruised rt side of face and left knee and rt shin. Did not pass out. No LOC.  Hypertension: Amlodipine 10 mg once daily, losartan 100 mg once daily.  COPD: on tiotropium Chronic respiratory failure with hypoxia: uses oxygen at night and with exertion. His portable oxygen broke when he fell. Pt was seeing Dr. Gwenette Greet, but he got up their and they had retired. Pt is scheduled to go to [facility] to see new pulmonary doctor in February 2nd. Pt had walk [facility] last [facility].  [facility]  Hyperlipidemia: Patient is currently on pravastatin 40 mg once daily and aspirin 81 mg once daily GERD: on omeprazole 40 mg one bid.   Current Outpatient Medications on File Prior to Visit  Medication Sig Dispense Refill   albuterol (PROVENTIL HFA;VENTOLIN HFA) 108 (90 Base) MCG/ACT inhaler Inhale two puffs every four to six hours as needed for cough or wheeze. 1 Inhaler 1   amLODipine (NORVASC) 10 MG tablet TAKE 1 TABLET BY MOUTH EVERY DAY 90 tablet 1   aspirin EC 81 MG tablet Take 81 mg by mouth daily. Swallow whole.     chlorpheniramine (CHLOR-TRIMETON) 4 MG tablet Take 4 mg by mouth 2 (two) times daily as needed for allergies.     clopidogrel (PLAVIX) 75 MG tablet Take 1 tablet by mouth daily.     losartan (COZAAR) 100 MG tablet Take 1 tablet by mouth daily.     Multiple Vitamins-Minerals (MULTIVITAMIN WITH MINERALS) tablet Take 1 tablet by mouth daily.     Omega-3 1000 MG CAPS Take by mouth.     omega-3 acid ethyl esters (LOVAZA) 1 g capsule Take 2 capsules (2 g total) by mouth 2 (two) times daily. 360 capsule 0   omeprazole  (PRILOSEC) 40 MG capsule TAKE 1 CAPSULE(40 MG) BY MOUTH IN THE MORNING AND AT BEDTIME 180 capsule 1   pravastatin (PRAVACHOL) 40 MG tablet TAKE 1 TABLET(40 MG) BY MOUTH DAILY 90 tablet 0   sertraline (ZOLOFT) 100 MG tablet TAKE 1 TABLET BY MOUTH ONCE DAILY 90 tablet 3   Tiotropium Bromide-Olodaterol 2.5-2.5 MCG/ACT AERS Inhale 2 puffs into the lungs daily.      No current facility-administered medications on file prior to visit.   Past Medical History:  Diagnosis Date   Atherosclerosis of aorta (HCC)    Bowel obstruction ([place]) 2018   BPH (benign prostatic hyperplasia)    COPD (chronic obstructive pulmonary disease) (HCC)    COPD (chronic obstructive pulmonary disease) (HCC)    GERD (gastroesophageal reflux disease)    Hypertension    Hypoxemia    Lumbar stenosis with neurogenic claudication 04/24/2017   Mixed hyperlipidemia    Osteoarthritis    Pneumonia 2016,2017   Primary insomnia    Sleep apnea    Spontaneous ecchymoses    Past Surgical History:  Procedure Laterality Date   ABDOMINAL AORTIC ANEURYSM REPAIR     BACK SURGERY N/A [phone], 2021   Lower back and cervical spine   BACK SURGERY  1984   upper back   CATARACT EXTRACTION Left 2017  CATARACT EXTRACTION Right 2017   CHOLECYSTECTOMY  2018   HERNIA REPAIR  2018   PROSTATECTOMY     ROTATOR CUFF REPAIR Right 2015   ROTATOR CUFF REPAIR Left 2017    Family History  Problem Relation Age of Onset   Heart failure Mother    Diabetes Mother    CAD Mother    Hypertension Mother    Social History   Socioeconomic History   Marital status: Married    Spouse name: Vaughan Basta   Number of children: 2   Years of education: Not on file   Highest education level: Not on file  Occupational History   Occupation: retired    Comment: Event organiser  Tobacco Use   Smoking status: Former    Packs/day: 2.00    Years: 50.00    Pack years: 100.00    Types: Cigarettes    Quit date: 12/24/2007    Years  since quitting: 13.8   Smokeless tobacco: Never  Vaping Use   Vaping Use: Never used  Substance and Sexual Activity   Alcohol use: No    Alcohol/week: 0.0 standard drinks   Drug use: No   Sexual activity: Not on file  Other Topics Concern   Not on file  Social History Narrative   Not on file   Social Determinants of Health   Financial Resource Strain: Not on file  Food Insecurity: Not on file  Transportation Needs: Not on file  Physical Activity: Not on file  Stress: Not on file  Social Connections: Not on file    Review of Systems  Constitutional:  Negative for chills, fatigue and fever.  Respiratory:  Positive for cough and shortness of breath.   Cardiovascular:  Negative for chest pain and palpitations.  Gastrointestinal:  Positive for diarrhea. Negative for abdominal pain, nausea and vomiting.  Genitourinary:  Negative for dysuria.  Musculoskeletal:  Positive for arthralgias and back pain.  Neurological:  Positive for weakness. Negative for dizziness and headaches.  Psychiatric/Behavioral:  Negative for dysphoric mood. The patient is not nervous/anxious.     Objective:  BP 120/70   Pulse 84   Temp (!) 97.2 F (36.2 C)   Resp 18   Ht 5\' 9"  (1.753 m)   Wt 185 lb (83.9 kg)   BMI 27.32 kg/m   BP/Weight 10/30/2021 07/27/2021 2/87/8676  Systolic BP 720 947 096  Diastolic BP 70 62 85  Wt. (Lbs) 185 189 189.25  BMI 27.32 27.91 27.95    Physical Exam Vitals reviewed.  Constitutional:      Appearance: Normal appearance. He is obese.  Neck:     Vascular: No carotid bruit.  Cardiovascular:     Rate and Rhythm: Normal rate and regular rhythm.     Heart sounds: Normal heart sounds.  Pulmonary:     Effort: Pulmonary effort is normal.     Breath sounds: Normal breath sounds. No wheezing, rhonchi or rales.  Abdominal:     General: Bowel sounds are normal.     Palpations: Abdomen is soft.     Tenderness: There is no abdominal tenderness.  Neurological:     Mental  Status: He is alert and oriented to person, place, and time.  Psychiatric:        Mood and Affect: Mood normal.        Behavior: Behavior normal.    Diabetic Foot Exam - Simple   No data filed      Lab Results  Component Value Date  WBC 9.5 10/30/2021   HGB 14.4 10/30/2021   HCT 42.3 10/30/2021   PLT 271 10/30/2021   GLUCOSE 91 10/30/2021   CHOL 163 10/30/2021   TRIG 125 10/30/2021   HDL 42 10/30/2021   LDLCALC 99 10/30/2021   ALT 27 10/30/2021   AST 29 10/30/2021   NA 140 10/30/2021   K 4.2 10/30/2021   CL 105 10/30/2021   CREATININE 0.89 10/30/2021   BUN 14 10/30/2021   CO2 21 10/30/2021   TSH 1.060 10/30/2021   HGBA1C 5.4 10/30/2021      Assessment & Plan:   Problem List Items Addressed This Visit       Cardiovascular and Mediastinum   Essential hypertension, benign - Primary    The current medical regimen is effective;  continue present plan and medications. Continue Amlodipine 10 mg once daily, losartan 100 mg once daily.       Relevant Orders   CBC with Differential/Platelet (Completed)   Comprehensive metabolic panel (Completed)   TSH (Completed)   PVD (peripheral vascular disease) (HCC)    Continue aspirin, plavix, and pravastatin.      Aortic atherosclerosis (HCC)    Continue pravastatin, plavix and aspirin.        Respiratory   Chronic obstructive pulmonary disease (HCC)    Continue tiptropium-olodaterol inhale 2 puffs daily. Continue albuterol      Chronic respiratory failure with hypoxia (HCC)    Needs nocturnal oxygen at 2 L. Needs portable oxygen replaced due to a broken machine. Pt needs oxygen with exertion.      Relevant Orders   Ambulatory Referral for DME   OSA on CPAP    Continue to wear cpap with oxygen.        Digestive   Gastroesophageal reflux disease    Continue omeprazole 40 mg bid.        Endocrine   Impaired glucose tolerance    Low sugar and low carb diet.       Relevant Orders   Hemoglobin A1c  (Completed)     Other   Oxygen dependent    On oxygen 2L.      Relevant Orders   Ambulatory Referral for DME   Mild recurrent major depression (HCC)    Continue zoloft 100 mg once daily.      Mixed hyperlipidemia    Continue pravastatin.  Recommend continue to work on eating healthy diet and exercise.       Relevant Orders   Lipid panel (Completed)   Other Visit Diagnoses     Need for influenza vaccination       Relevant Orders   Flu Vaccine QUAD High Dose(Fluad) (Completed)     .  No orders of the defined types were placed in this encounter.   Orders Placed This Encounter  Procedures   Flu Vaccine QUAD High Dose(Fluad)   CBC with Differential/Platelet   Comprehensive metabolic panel   Hemoglobin A1c   Lipid panel   TSH   Ambulatory Referral for DME     Follow-up: Return in about 3 months (around 01/30/2022) for chronic fasting.  An After Visit Summary was printed and given to the patient.  [name], MD [facility] [phone]

## 2021-10-30 NOTE — Telephone Encounter (Signed)
Patient notified that we spoke with the [employer] and they no longer provider portable concentrators.  We can send the order to a local DME company but we will need a copy of his recent visit to the [place] that requalified him for his home oxygen.

## 2021-10-31 ENCOUNTER — Other Ambulatory Visit: Payer: Self-pay | Admitting: Family Medicine

## 2021-10-31 DIAGNOSIS — J9611 Chronic respiratory failure with hypoxia: Secondary | ICD-10-CM

## 2021-10-31 LAB — CBC WITH DIFFERENTIAL/PLATELET
Basophils Absolute: 0.1 10*3/uL (ref 0.0–0.2)
Basos: 1 %
EOS (ABSOLUTE): 0.2 10*3/uL (ref 0.0–0.4)
Eos: 2 %
Hematocrit: 42.3 % (ref 37.5–51.0)
Hemoglobin: 14.4 g/dL (ref 13.0–17.7)
Immature Grans (Abs): 0 10*3/uL (ref 0.0–0.1)
Immature Granulocytes: 0 %
Lymphocytes Absolute: 1.3 10*3/uL (ref 0.7–3.1)
Lymphs: 13 %
MCH: 32.4 pg (ref 26.6–33.0)
MCHC: 34 g/dL (ref 31.5–35.7)
MCV: 95 fL (ref 79–97)
Monocytes Absolute: 1.1 10*3/uL — ABNORMAL HIGH (ref 0.1–0.9)
Monocytes: 12 %
Neutrophils Absolute: 6.9 10*3/uL (ref 1.4–7.0)
Neutrophils: 72 %
Platelets: 271 10*3/uL (ref 150–450)
RBC: 4.44 x10E6/uL (ref 4.14–5.80)
RDW: 13.1 % (ref 11.6–15.4)
WBC: 9.5 10*3/uL (ref 3.4–10.8)

## 2021-10-31 LAB — COMPREHENSIVE METABOLIC PANEL
ALT: 27 IU/L (ref 0–44)
AST: 29 IU/L (ref 0–40)
Albumin/Globulin Ratio: 2 (ref 1.2–2.2)
Albumin: 4.5 g/dL (ref 3.7–4.7)
Alkaline Phosphatase: 122 IU/L — ABNORMAL HIGH (ref 44–121)
BUN/Creatinine Ratio: 16 (ref 10–24)
BUN: 14 mg/dL (ref 8–27)
Bilirubin Total: 0.8 mg/dL (ref 0.0–1.2)
CO2: 21 mmol/L (ref 20–29)
Calcium: 9.8 mg/dL (ref 8.6–10.2)
Chloride: 105 mmol/L (ref 96–106)
Creatinine, Ser: 0.89 mg/dL (ref 0.76–1.27)
Globulin, Total: 2.3 g/dL (ref 1.5–4.5)
Glucose: 91 mg/dL (ref 70–99)
Potassium: 4.2 mmol/L (ref 3.5–5.2)
Sodium: 140 mmol/L (ref 134–144)
Total Protein: 6.8 g/dL (ref 6.0–8.5)
eGFR: 88 mL/min/{1.73_m2} (ref >59)

## 2021-10-31 LAB — LIPID PANEL
Chol/HDL Ratio: 3.9 ratio (ref 0.0–5.0)
Cholesterol, Total: 163 mg/dL (ref 100–199)
HDL: 42 mg/dL (ref >39)
LDL Chol Calc (NIH): 99 mg/dL (ref 0–99)
Triglycerides: 125 mg/dL (ref 0–149)
VLDL Cholesterol Cal: 22 mg/dL (ref 5–40)

## 2021-10-31 LAB — HEMOGLOBIN A1C
Est. average glucose Bld gHb Est-mCnc: 108 mg/dL
Hgb A1c MFr Bld: 5.4 % (ref 4.8–5.6)

## 2021-10-31 LAB — TSH: TSH: 1.06 u[IU]/mL (ref 0.450–4.500)

## 2021-11-03 DIAGNOSIS — I700 Atherosclerosis of aorta: Secondary | ICD-10-CM | POA: Insufficient documentation

## 2021-11-03 NOTE — Assessment & Plan Note (Signed)
The current medical regimen is effective;  continue present plan and medications. Continue Amlodipine 10 mg once daily, losartan 100 mg once daily.

## 2021-11-03 NOTE — Assessment & Plan Note (Signed)
Continue zoloft 100 mg once daily.

## 2021-11-03 NOTE — Assessment & Plan Note (Signed)
Continue pravastatin.  Recommend continue to work on eating healthy diet and exercise.

## 2021-11-03 NOTE — Assessment & Plan Note (Signed)
Continue tiptropium-olodaterol inhale 2 puffs daily. Continue albuterol

## 2021-11-03 NOTE — Assessment & Plan Note (Signed)
Continue pravastatin, plavix and aspirin.

## 2021-11-03 NOTE — Assessment & Plan Note (Signed)
On oxygen 2L.

## 2021-11-03 NOTE — Assessment & Plan Note (Addendum)
Continue aspirin, plavix, and pravastatin.

## 2021-11-03 NOTE — Assessment & Plan Note (Signed)
Continue omeprazole 40 mg bid.

## 2021-11-03 NOTE — Assessment & Plan Note (Signed)
Low sugar and low carb diet.

## 2021-11-03 NOTE — Assessment & Plan Note (Addendum)
>>  ASSESSMENT AND PLAN FOR CHRONIC RESPIRATORY FAILURE WITH HYPOXIA (HCC) WRITTEN ON 11/03/2021  2:37 PM BY Kourtnei Rauber, MD  Needs nocturnal oxygen at 2 L. Needs portable oxygen replaced due to a broken machine. Pt needs oxygen with exertion.   >>ASSESSMENT AND PLAN FOR CHRONIC OBSTRUCTIVE PULMONARY DISEASE (HCC) WRITTEN ON 11/03/2021  2:35 PM BY Verma Grothaus, MD  Continue tiptropium-olodaterol inhale 2 puffs daily. Continue albuterol

## 2021-11-03 NOTE — Assessment & Plan Note (Signed)
Continue to wear cpap with oxygen.

## 2021-11-26 DIAGNOSIS — I739 Peripheral vascular disease, unspecified: Secondary | ICD-10-CM | POA: Diagnosis not present

## 2021-11-26 DIAGNOSIS — Z95820 Peripheral vascular angioplasty status with implants and grafts: Secondary | ICD-10-CM | POA: Diagnosis not present

## 2021-11-30 ENCOUNTER — Other Ambulatory Visit: Payer: Self-pay | Admitting: Family Medicine

## 2021-11-30 DIAGNOSIS — H26491 Other secondary cataract, right eye: Secondary | ICD-10-CM | POA: Diagnosis not present

## 2021-12-05 ENCOUNTER — Telehealth: Payer: Self-pay

## 2021-12-05 NOTE — Chronic Care Management (AMB) (Signed)
° ° °  Chronic Care Management Pharmacy Assistant   Name: JOBAN COLLEDGE  MRN: [id number] DOB: 09/28/1941   Reason for Encounter: [place] Adherence Report  Messaged Shelle Iron LPN to schedule AWV.Updated report     Medications: Outpatient Encounter Medications as of 12/05/2021  Medication Sig   albuterol (PROVENTIL HFA;VENTOLIN HFA) 108 (90 Base) MCG/ACT inhaler Inhale two puffs every four to six hours as needed for cough or wheeze.   amLODipine (NORVASC) 10 MG tablet TAKE 1 TABLET BY MOUTH EVERY DAY   aspirin EC 81 MG tablet Take 81 mg by mouth daily. Swallow whole.   chlorpheniramine (CHLOR-TRIMETON) 4 MG tablet Take 4 mg by mouth 2 (two) times daily as needed for allergies.   clopidogrel (PLAVIX) 75 MG tablet Take 1 tablet by mouth daily.   losartan (COZAAR) 100 MG tablet TAKE 1 TABLET BY MOUTH EVERY DAY   Multiple Vitamins-Minerals (MULTIVITAMIN WITH MINERALS) tablet Take 1 tablet by mouth daily.   Omega-3 1000 MG CAPS Take by mouth.   omega-3 acid ethyl esters (LOVAZA) 1 g capsule Take 2 capsules (2 g total) by mouth 2 (two) times daily.   omeprazole (PRILOSEC) 40 MG capsule TAKE 1 CAPSULE(40 MG) BY MOUTH IN THE MORNING AND AT BEDTIME   pravastatin (PRAVACHOL) 40 MG tablet TAKE 1 TABLET(40 MG) BY MOUTH DAILY   sertraline (ZOLOFT) 100 MG tablet TAKE 1 TABLET BY MOUTH ONCE DAILY   Tiotropium Bromide-Olodaterol 2.5-2.5 MCG/ACT AERS Inhale 2 puffs into the lungs daily.    No facility-administered encounter medications on file as of 12/05/2021.    Elray Mcgregor, [place] Pharmacist Assistant  [phone]

## 2021-12-07 ENCOUNTER — Telehealth: Payer: Self-pay

## 2021-12-07 NOTE — Progress Notes (Signed)
° ° °  Chronic Care Management Pharmacy Assistant   Name: William Clay  MRN: [id number] DOB: 10-07-1941    Reason for Encounter: Disease State/General Adherence Call    Recent office visits:   10/30/2021 [name] MD (PCP)      Recent consult visits:   11/26/2021 Concha Se MD ( Vascular Surgery)    11/13/2021 Delorise Shiner MD ( [facility])  10/24/2021 Landry Mellow MD ( [facility])   [facility] visits:  None in previous 6 months  Medications: Outpatient Encounter Medications as of 12/07/2021  Medication Sig   albuterol (PROVENTIL HFA;VENTOLIN HFA) 108 (90 Base) MCG/ACT inhaler Inhale two puffs every four to six hours as needed for cough or wheeze.   amLODipine (NORVASC) 10 MG tablet TAKE 1 TABLET BY MOUTH EVERY DAY   aspirin EC 81 MG tablet Take 81 mg by mouth daily. Swallow whole.   chlorpheniramine (CHLOR-TRIMETON) 4 MG tablet Take 4 mg by mouth 2 (two) times daily as needed for allergies.   clopidogrel (PLAVIX) 75 MG tablet Take 1 tablet by mouth daily.   losartan (COZAAR) 100 MG tablet TAKE 1 TABLET BY MOUTH EVERY DAY   Multiple Vitamins-Minerals (MULTIVITAMIN WITH MINERALS) tablet Take 1 tablet by mouth daily.   Omega-3 1000 MG CAPS Take by mouth.   omega-3 acid ethyl esters (LOVAZA) 1 g capsule Take 2 capsules (2 g total) by mouth 2 (two) times daily.   omeprazole (PRILOSEC) 40 MG capsule TAKE 1 CAPSULE(40 MG) BY MOUTH IN THE MORNING AND AT BEDTIME   pravastatin (PRAVACHOL) 40 MG tablet TAKE 1 TABLET(40 MG) BY MOUTH DAILY   sertraline (ZOLOFT) 100 MG tablet TAKE 1 TABLET BY MOUTH ONCE DAILY   Tiotropium Bromide-Olodaterol 2.5-2.5 MCG/ACT AERS Inhale 2 puffs into the lungs daily.    No facility-administered encounter medications on file as of 12/07/2021.   [place] for general disease state and medication adherence call.   Patient is not > 5 days past due for refill on the following medications per chart history:   What concerns do you  have about your medications?   Patient does not have any concerns with medication at this time.  The patient denies side effects with his medications.   How often do you forget or accidentally miss a dose? Never  Do you use a pillbox? Yes  Are you having any problems getting your medications from your pharmacy? No  Has the cost of your medications been a concern? No If yes, what medication and is patient assistance available or has it been applied for?  Since last visit with CPP, no interventions have been made:   The patient has not had an ED visit since last contact.   The patient denies problems with their health.   he denies  concerns or questions for [place] Constable, at this time.   Patient states he does not keep a log of his B/P or BS.    Care Gaps: Last annual wellness visit- 11/30/2020- message sent to provider to set AWV. Last eye exam / retinopathy screening- N/A Diabetic foot exam- N/A  Star Medications: Medication Name/mg Last Fill Days Supply  Pravastatin 40mg .     06/06, 10/24     90DS Losartan 100mg .       8/26, 12/09         [place] Clinical Pharmacist Assistant [phone]

## 2021-12-27 DIAGNOSIS — J449 Chronic obstructive pulmonary disease, unspecified: Secondary | ICD-10-CM | POA: Diagnosis not present

## 2021-12-27 DIAGNOSIS — I10 Essential (primary) hypertension: Secondary | ICD-10-CM | POA: Diagnosis not present

## 2021-12-27 DIAGNOSIS — K219 Gastro-esophageal reflux disease without esophagitis: Secondary | ICD-10-CM | POA: Diagnosis not present

## 2021-12-27 DIAGNOSIS — F3341 Major depressive disorder, recurrent, in partial remission: Secondary | ICD-10-CM | POA: Diagnosis not present

## 2021-12-27 DIAGNOSIS — Z9849 Cataract extraction status, unspecified eye: Secondary | ICD-10-CM | POA: Diagnosis not present

## 2021-12-27 DIAGNOSIS — E663 Overweight: Secondary | ICD-10-CM | POA: Diagnosis not present

## 2021-12-27 DIAGNOSIS — J309 Allergic rhinitis, unspecified: Secondary | ICD-10-CM | POA: Diagnosis not present

## 2021-12-27 DIAGNOSIS — Z87891 Personal history of nicotine dependence: Secondary | ICD-10-CM | POA: Diagnosis not present

## 2021-12-27 DIAGNOSIS — E785 Hyperlipidemia, unspecified: Secondary | ICD-10-CM | POA: Diagnosis not present

## 2021-12-27 DIAGNOSIS — Z7982 Long term (current) use of aspirin: Secondary | ICD-10-CM | POA: Diagnosis not present

## 2021-12-27 DIAGNOSIS — G4733 Obstructive sleep apnea (adult) (pediatric): Secondary | ICD-10-CM | POA: Diagnosis not present

## 2021-12-27 DIAGNOSIS — G8929 Other chronic pain: Secondary | ICD-10-CM | POA: Diagnosis not present

## 2022-01-16 ENCOUNTER — Encounter: Payer: Self-pay | Admitting: Family Medicine

## 2022-01-23 ENCOUNTER — Other Ambulatory Visit: Payer: Self-pay | Admitting: Physician Assistant

## 2022-01-23 ENCOUNTER — Telehealth: Payer: Self-pay

## 2022-01-23 DIAGNOSIS — U071 COVID-19: Secondary | ICD-10-CM

## 2022-01-23 MED ORDER — MOLNUPIRAVIR EUA 200MG CAPSULE: 4.0000 | ORAL_CAPSULE | Freq: Two times a day (BID) | ORAL | 0 refills | Status: AC

## 2022-01-23 NOTE — Telephone Encounter (Signed)
Patient called and stated that he had went to the hospital on Monday and was diagnosed with COVID and tested positive. Patient was told he needed to follow up with Dr. Tobie Poet in 3-5 days, and also stated that the hospital never sent in his Anti-viral for the [place]. Patient's wife was also seen today at the office and tested positive as well. The patient was with the wife at the time and [name] [facility] was seeing the wife and stated that the patient was not in any distress and would call in his molnupiravir and told patient to hold his cholesterol medication while taking the prescription as well, and patient was scheduled to do a follow up Phone call on Friday feb. 3rd with [name] [facility] to check on symptoms and go over the hospital follow up as well. Patient agreed to treatment and appointment as well and was informed of time and day of appointment with [place]. [place]

## 2022-01-25 ENCOUNTER — Encounter: Payer: Medicare Other | Admitting: Physician Assistant

## 2022-01-25 ENCOUNTER — Encounter: Payer: Self-pay | Admitting: Physician Assistant

## 2022-01-25 ENCOUNTER — Other Ambulatory Visit: Payer: Self-pay

## 2022-03-07 ENCOUNTER — Ambulatory Visit (INDEPENDENT_AMBULATORY_CARE_PROVIDER_SITE_OTHER): Payer: PPO

## 2022-03-07 VITALS — BP 120/74 | HR 68 | Resp 20 | Ht 69.0 in | Wt 186.4 lb

## 2022-03-07 DIAGNOSIS — Z0000 Encounter for general adult medical examination without abnormal findings: Secondary | ICD-10-CM

## 2022-03-11 NOTE — Patient Instructions (Signed)

## 2022-03-11 NOTE — Progress Notes (Signed)
? ?Subjective:  ? William Clay is a 81 y.o. male who presents for Medicare Annual/Subsequent preventive examination.  This wellness visit is conducted by a nurse.  The patient's medications were reviewed and reconciled since the patient's last visit.  History details were provided by the patient.  The history appears to be reliable.   ? ?Patient's last AWV was one year ago.   ?Medical History: Patient history and Family history was reviewed  ?Medications, Allergies, and preventative health maintenance was reviewed and updated.  ? ?Cardiac Risk Factors include: advanced age (>61mn, >>37women);dyslipidemia;male gender ? ?   ?Objective:  ?  ?Today's Vitals  ? 03/07/22 1028  ?BP: 120/74  ?Pulse: 68  ?Resp: 20  ?Weight: 186 lb 6.4 oz (84.6 kg)  ?Height: '5\' 9"'$  (1.753 m)  ?PainSc: 3   ?PainLoc: Back  ? ?Body mass index is 27.53 kg/m?. ? ?Advanced Directives 11/30/2020  ?Does Patient Have a Medical Advance Directive? Yes  ?Type of Advance Directive Healthcare Power of Attorney  ?Copy of H[place]in Chart? No - copy requested  ? ? ?Current Medications (verified) ?Outpatient Encounter Medications as of 03/07/2022  ?Medication Sig  ? albuterol (PROVENTIL HFA;VENTOLIN HFA) 108 (90 Base) MCG/ACT inhaler Inhale two puffs every four to six hours as needed for cough or wheeze.  ? amLODipine (NORVASC) 10 MG tablet TAKE 1 TABLET BY MOUTH EVERY DAY  ? aspirin EC 81 MG tablet Take 81 mg by mouth daily. Swallow whole.  ? chlorpheniramine (CHLOR-TRIMETON) 4 MG tablet Take 4 mg by mouth 2 (two) times daily as needed for allergies.  ? clopidogrel (PLAVIX) 75 MG tablet Take 1 tablet by mouth daily.  ? losartan (COZAAR) 100 MG tablet TAKE 1 TABLET BY MOUTH EVERY DAY  ? Multiple Vitamins-Minerals (MULTIVITAMIN WITH MINERALS) tablet Take 1 tablet by mouth daily.  ? Omega-3 1000 MG CAPS Take by mouth.  ? omega-3 acid ethyl esters (LOVAZA) 1 g capsule Take 2 capsules (2 g total) by mouth 2 (two) times daily.  ? omeprazole  (PRILOSEC) 40 MG capsule TAKE 1 CAPSULE(40 MG) BY MOUTH IN THE MORNING AND AT BEDTIME  ? pravastatin (PRAVACHOL) 40 MG tablet TAKE 1 TABLET(40 MG) BY MOUTH DAILY  ? pregabalin (LYRICA) 50 MG capsule Take 1 capsule by mouth 2 (two) times daily.  ? sertraline (ZOLOFT) 100 MG tablet TAKE 1 TABLET BY MOUTH ONCE DAILY  ? Tiotropium Bromide-Olodaterol 2.5-2.5 MCG/ACT AERS Inhale 2 puffs into the lungs daily.   ? ?No facility-administered encounter medications on file as of 03/07/2022.  ? ? ?Allergies (verified) ?Cefadroxil, Lisinopril, Simvastatin, Amoxicillin, Gabapentin, Metoprolol, and Potassium clavulanate [clavulanic acid]  ? ?History: ?Past Medical History:  ?Diagnosis Date  ? Atherosclerosis of aorta ([place]   ? Bowel obstruction ([place] 2018  ? BPH (benign prostatic hyperplasia)   ? COPD (chronic obstructive pulmonary disease) ([place]   ? COPD (chronic obstructive pulmonary disease) ([place]   ? GERD (gastroesophageal reflux disease)   ? Hypertension   ? Hypoxemia   ? Lumbar stenosis with neurogenic claudication 04/24/2017  ? Mixed hyperlipidemia   ? Osteoarthritis   ? Pneumonia 2016,2017  ? Primary insomnia   ? Sleep apnea   ? Spontaneous ecchymoses   ? ?Past Surgical History:  ?Procedure Laterality Date  ? ABDOMINAL AORTIC ANEURYSM REPAIR    ? BACK SURGERY N/A [phone] 2021  ? Lower back and cervical spine  ? BACK SURGERY  1984  ? upper back  ? CATARACT EXTRACTION Left 2017  ?  CATARACT EXTRACTION Right 2017  ? CHOLECYSTECTOMY  2018  ? HERNIA REPAIR  2018  ? PROSTATECTOMY    ? ROTATOR CUFF REPAIR Right 2015  ? ROTATOR CUFF REPAIR Left 2017  ? ?Family History  ?Problem Relation Age of Onset  ? Heart failure Mother   ? Diabetes Mother   ? CAD Mother   ? Hypertension Mother   ? ?Social History  ? ?Socioeconomic History  ? Marital status: Married  ?  Spouse name: Vaughan Basta  ? Number of children: 2  ? Years of education: Not on file  ? Highest education level: Not on file  ?Occupational History  ?  Occupation: retired  ?  Comment: Law Enforcement  ? Occupation: Retired Agricultural engineer  ?Tobacco Use  ? Smoking status: Former  ?  Packs/day: 2.00  ?  Years: 50.00  ?  Pack years: 100.00  ?  Types: Cigarettes  ?  Quit date: 12/24/2007  ?  Years since quitting: 14.2  ? Smokeless tobacco: Never  ?Vaping Use  ? Vaping Use: Never used  ?Substance and Sexual Activity  ? Alcohol use: No  ?  Alcohol/week: 0.0 standard drinks  ? Drug use: No  ? Sexual activity: Not on file  ?Other Topics Concern  ? Not on file  ?Social History Narrative  ? Not on file  ? ?Social Determinants of Health  ? ?Financial Resource Strain: Not on file  ?Food Insecurity: No Food Insecurity  ? Worried About Charity fundraiser in the Last Year: Never true  ? Ran Out of Food in the Last Year: Never true  ?Transportation Needs: No Transportation Needs  ? Lack of Transportation (Medical): No  ? Lack of Transportation (Non-Medical): No  ?Physical Activity: Not on file  ?Stress: Not on file  ?Social Connections: Not on file  ? ? ?Tobacco Counseling ?Counseling given: Not Answered ? ? ?Clinical Intake: ? ?Pre-visit preparation completed: Yes ? ?Pain : 0-10 ?Pain Score: 3  ?Pain Type: Chronic pain ?Pain Location: Back ?  ?BMI - recorded: 27.53 ?Nutritional Status: BMI 25 -29 Overweight ?Nutritional Risks: None ?Diabetes: No ?How often do you need to have someone help you when you read instructions, pamphlets, or other written materials from your doctor or pharmacy?: 1 - Never ?Interpreter Needed?: No ?  ? ?Activities of Daily Living ?In your present state of health, do you have any difficulty performing the following activities: 03/11/2022  ?Hearing? Y  ?Vision? N  ?Difficulty concentrating or making decisions? N  ?Walking or climbing stairs? Y  ?Dressing or bathing? N  ?Doing errands, shopping? N  ?Preparing Food and eating ? N  ?Using the Toilet? N  ?In the past six months, have you accidently leaked urine? N  ?Do you have problems with loss of bowel  control? N  ?Managing your Medications? N  ?Managing your Finances? N  ?Housekeeping or managing your Housekeeping? N  ?Some recent data might be hidden  ? ? ?Patient Care Team: ?CoxElnita Maxwell, MD as PCP - General (Family Medicine) ?Burnice Logan, [place] (Inactive) as Pharmacist (Pharmacist) ?Murlean Iba, MD as Referring Physician (Orthopedic Surgery) ? ?Indicate any recent Medical Services you may have received from other than [facility] providers in the past year (date may be approximate). ?See's provider at [facility] in [place] for most things ?   ?Assessment:  ? This is a routine wellness examination for [place]. ? ? ?Dietary issues and exercise activities discussed: ?Current Exercise Habits: The patient does not participate in  regular exercise at present, Exercise limited by: respiratory conditions(s);orthopedic condition(s) ? ? ?Depression Screen ?PHQ 2/9 Scores 03/11/2022 10/30/2021 04/22/2021 04/22/2021 01/05/2021 11/30/2020 05/10/2020  ?PHQ - 2 Score 0 0 1 0 0 0 0  ?PHQ- 9 Score - - 3 - - - -  ?  ?Fall Risk ?Fall Risk  03/11/2022 10/30/2021 04/22/2021 01/05/2021 11/30/2020  ?Falls in the past year? '1 1 1 '$ 0 1  ?Comment - - - - -  ?Number falls in past yr: '1 1 1 '$ 0 1  ?Injury with Fall? 0 0 0 0 0  ?Risk for fall due to : Impaired balance/gait - History of fall(s) - History of fall(s)  ?Follow up Falls evaluation completed;Education provided;Falls prevention discussed Falls evaluation completed Falls evaluation completed;Education provided - -  ? ? ?FALL RISK PREVENTION PERTAINING TO THE HOME: ? ?Any stairs in or around the home? Yes  ?If so, are there any without handrails? No  ?Home free of loose throw rugs in walkways, pet beds, electrical cords, etc? Yes  ?Adequate lighting in your home to reduce risk of falls? Yes  ? ?ASSISTIVE DEVICES UTILIZED TO PREVENT FALLS: ? ?Use of a cane, walker or w/c? No  ?Grab bars in the bathroom? Yes  ?Shower chair or bench in shower? Yes  ?Elevated toilet seat or a handicapped toilet? No   ? ?Gait slow and steady without use of assistive device ? ?Cognitive Function: ?  ?  ?6CIT Screen 03/11/2022 11/30/2020  ?What Year? 0 points 0 points  ?What month? 0 points 0 points  ?What time? 0 points 0 points  ?Co

## 2022-03-14 ENCOUNTER — Telehealth: Payer: Self-pay

## 2022-03-14 NOTE — Progress Notes (Addendum)
? ? ?Chronic Care Management ?Pharmacy Assistant  ? ?Name: William Clay  MRN: [id number] DOB: 02/03/1941 ? ? ?Reason for Encounter: General Adherence Call  ?  ?Recent office visits:  ?03/07/22 [name] LPN. Seen for Medicare Annual Wellness. No med changes.  ? ?01/23/22 [name] PA-C. Orders Only. Ordered Lagevrio '200mg'$ .  ? ?Recent consult visits:  ?None ? ?Hospital visits:  ?Medication Reconciliation was completed by comparing discharge summary, patient?s EMR and Pharmacy list, and upon discussion with patient. ? ?Admitted to the hospital on 01/22/22 due to SOB. Discharge date was 01/22/22. Discharged from [facility].   ? ?Medications remain the same after Hospital Discharge:??  ? ?Medications: ?Outpatient Encounter Medications as of 03/14/2022  ?Medication Sig  ? albuterol (PROVENTIL HFA;VENTOLIN HFA) 108 (90 Base) MCG/ACT inhaler Inhale two puffs every four to six hours as needed for cough or wheeze.  ? amLODipine (NORVASC) 10 MG tablet TAKE 1 TABLET BY MOUTH EVERY DAY  ? aspirin EC 81 MG tablet Take 81 mg by mouth daily. Swallow whole.  ? chlorpheniramine (CHLOR-TRIMETON) 4 MG tablet Take 4 mg by mouth 2 (two) times daily as needed for allergies.  ? clopidogrel (PLAVIX) 75 MG tablet Take 1 tablet by mouth daily.  ? losartan (COZAAR) 100 MG tablet TAKE 1 TABLET BY MOUTH EVERY DAY  ? Multiple Vitamins-Minerals (MULTIVITAMIN WITH MINERALS) tablet Take 1 tablet by mouth daily.  ? Omega-3 1000 MG CAPS Take by mouth.  ? omega-3 acid ethyl esters (LOVAZA) 1 g capsule Take 2 capsules (2 g total) by mouth 2 (two) times daily.  ? omeprazole (PRILOSEC) 40 MG capsule TAKE 1 CAPSULE(40 MG) BY MOUTH IN THE MORNING AND AT BEDTIME  ? pravastatin (PRAVACHOL) 40 MG tablet TAKE 1 TABLET(40 MG) BY MOUTH DAILY  ? pregabalin (LYRICA) 50 MG capsule Take 1 capsule by mouth 2 (two) times daily.  ? sertraline (ZOLOFT) 100 MG tablet TAKE 1 TABLET BY MOUTH ONCE DAILY  ? Tiotropium Bromide-Olodaterol  2.5-2.5 MCG/ACT AERS Inhale 2 puffs into the lungs daily.   ? ?No facility-administered encounter medications on file as of 03/14/2022.  ? ? ?[place] for general disease state and medication adherence call.  ? ?Patient is not > 5 days past due for refill on the following medications per chart history: ? ?Star Medications: ?Medication Name/mg Last Fill Days Supply ?Losartan '100mg'$       Pt gets through [facility] ?Pravastatin '40mg'$       Pt gets through [facility] ? ?What concerns do you have about your medications? Pt reports that he was put on Asmanex Twisthaler for his COPD and it has caused him to cough more and develop a nasal congestion. He stated since he has been using it, it has caused sores around his mouth. Pt is getting all his meds through the [facility] [place] now and stated he is going to follow up with his [place] provider about this. He has stopped this medication yesterday and is using his Albuterol inhaler. ? ?The patient reports the following side effects with his medications. Asmanex Twisthaler. ? ?How often do you forget or accidentally miss a dose? Never ? ?Do you use a pillbox? Yes ? ?Are you having any problems getting your medications from your pharmacy? No ? ?Has the cost of your medications been a concern? No ? ?Since last visit with CPP, no interventions have been made:  ? ?The patient has not had an ED visit since last contact.  ? ?The patient denies  problems with their health.  ? ?he denies  concerns or questions for [place] Con[place], at this time. Pt stated everything else is going good and his blood pressures have been great. He did not have any readings in front of him to share with me.  ? ? ?Care Gaps: ?Last annual wellness visit- 03/07/22 ?Last eye exam: N/A ?Diabetic foot exam- N/A ?  ? ?Elray Mcgregor, CMA ?Clinical Pharmacist Assistant  ?[phone]  ?

## 2022-03-20 NOTE — Telephone Encounter (Signed)
Patient having issues with inhaler. Called to counsel about rinsing mouth out after use but still recommended he let [place] doctor know ASAP so they can change therapy ?

## 2022-03-23 DIAGNOSIS — Z20822 Contact with and (suspected) exposure to covid-19: Secondary | ICD-10-CM | POA: Diagnosis not present

## 2022-03-23 DIAGNOSIS — B9789 Other viral agents as the cause of diseases classified elsewhere: Secondary | ICD-10-CM | POA: Diagnosis not present

## 2022-03-23 DIAGNOSIS — J069 Acute upper respiratory infection, unspecified: Secondary | ICD-10-CM | POA: Diagnosis not present

## 2022-07-02 ENCOUNTER — Other Ambulatory Visit: Payer: PPO

## 2022-07-02 DIAGNOSIS — I10 Essential (primary) hypertension: Secondary | ICD-10-CM | POA: Diagnosis not present

## 2022-07-02 DIAGNOSIS — E782 Mixed hyperlipidemia: Secondary | ICD-10-CM

## 2022-07-02 DIAGNOSIS — R7302 Impaired glucose tolerance (oral): Secondary | ICD-10-CM

## 2022-07-03 LAB — CBC WITH DIFFERENTIAL/PLATELET
Basophils Absolute: 0.1 10*3/uL (ref 0.0–0.2)
Basos: 1 %
EOS (ABSOLUTE): 0.2 10*3/uL (ref 0.0–0.4)
Eos: 2 %
Hematocrit: 45.1 % (ref 37.5–51.0)
Hemoglobin: 14.8 g/dL (ref 13.0–17.7)
Immature Grans (Abs): 0 10*3/uL (ref 0.0–0.1)
Immature Granulocytes: 0 %
Lymphocytes Absolute: 1.1 10*3/uL (ref 0.7–3.1)
Lymphs: 11 %
MCH: 31.7 pg (ref 26.6–33.0)
MCHC: 32.8 g/dL (ref 31.5–35.7)
MCV: 97 fL (ref 79–97)
Monocytes Absolute: 1.3 10*3/uL — ABNORMAL HIGH (ref 0.1–0.9)
Monocytes: 13 %
Neutrophils Absolute: 7.5 10*3/uL — ABNORMAL HIGH (ref 1.4–7.0)
Neutrophils: 73 %
Platelets: 286 10*3/uL (ref 150–450)
RBC: 4.67 x10E6/uL (ref 4.14–5.80)
RDW: 13.6 % (ref 11.6–15.4)
WBC: 10.2 10*3/uL (ref 3.4–10.8)

## 2022-07-03 LAB — HEMOGLOBIN A1C
Est. average glucose Bld gHb Est-mCnc: 111 mg/dL
Hgb A1c MFr Bld: 5.5 % (ref 4.8–5.6)

## 2022-07-03 LAB — COMPREHENSIVE METABOLIC PANEL
ALT: 22 IU/L (ref 0–44)
AST: 25 IU/L (ref 0–40)
Albumin/Globulin Ratio: 1.9 (ref 1.2–2.2)
Albumin: 4.5 g/dL (ref 3.8–4.8)
Alkaline Phosphatase: 104 IU/L (ref 44–121)
BUN/Creatinine Ratio: 19 (ref 10–24)
BUN: 18 mg/dL (ref 8–27)
Bilirubin Total: 0.7 mg/dL (ref 0.0–1.2)
CO2: 19 mmol/L — ABNORMAL LOW (ref 20–29)
Calcium: 10.1 mg/dL (ref 8.6–10.2)
Chloride: 104 mmol/L (ref 96–106)
Creatinine, Ser: 0.94 mg/dL (ref 0.76–1.27)
Globulin, Total: 2.4 g/dL (ref 1.5–4.5)
Glucose: 92 mg/dL (ref 70–99)
Potassium: 4.4 mmol/L (ref 3.5–5.2)
Sodium: 140 mmol/L (ref 134–144)
Total Protein: 6.9 g/dL (ref 6.0–8.5)
eGFR: 83 mL/min/{1.73_m2} (ref >59)

## 2022-07-03 LAB — LIPID PANEL
Chol/HDL Ratio: 3.8 ratio (ref 0.0–5.0)
Cholesterol, Total: 161 mg/dL (ref 100–199)
HDL: 42 mg/dL (ref >39)
LDL Chol Calc (NIH): 90 mg/dL (ref 0–99)
Triglycerides: 170 mg/dL — ABNORMAL HIGH (ref 0–149)
VLDL Cholesterol Cal: 29 mg/dL (ref 5–40)

## 2022-07-03 LAB — CARDIOVASCULAR RISK ASSESSMENT

## 2022-07-04 ENCOUNTER — Ambulatory Visit (INDEPENDENT_AMBULATORY_CARE_PROVIDER_SITE_OTHER): Payer: PPO | Admitting: Family Medicine

## 2022-07-04 VITALS — BP 120/76 | HR 72 | Temp 97.3°F | Resp 18 | Ht 67.0 in | Wt 185.0 lb

## 2022-07-04 DIAGNOSIS — I10 Essential (primary) hypertension: Secondary | ICD-10-CM

## 2022-07-04 DIAGNOSIS — R7302 Impaired glucose tolerance (oral): Secondary | ICD-10-CM

## 2022-07-04 DIAGNOSIS — E782 Mixed hyperlipidemia: Secondary | ICD-10-CM | POA: Diagnosis not present

## 2022-07-04 DIAGNOSIS — Z6828 Body mass index (BMI) 28.0-28.9, adult: Secondary | ICD-10-CM | POA: Diagnosis not present

## 2022-07-04 DIAGNOSIS — Z9981 Dependence on supplemental oxygen: Secondary | ICD-10-CM

## 2022-07-04 DIAGNOSIS — K219 Gastro-esophageal reflux disease without esophagitis: Secondary | ICD-10-CM

## 2022-07-04 DIAGNOSIS — J410 Simple chronic bronchitis: Secondary | ICD-10-CM

## 2022-07-04 DIAGNOSIS — J9611 Chronic respiratory failure with hypoxia: Secondary | ICD-10-CM

## 2022-07-04 DIAGNOSIS — I700 Atherosclerosis of aorta: Secondary | ICD-10-CM | POA: Diagnosis not present

## 2022-07-04 DIAGNOSIS — F330 Major depressive disorder, recurrent, mild: Secondary | ICD-10-CM | POA: Diagnosis not present

## 2022-07-04 MED ORDER — ICOSAPENT ETHYL 1 G PO CAPS: 2.0000 g | ORAL_CAPSULE | Freq: Two times a day (BID) | ORAL | 1 refills | Status: DC

## 2022-07-04 MED ORDER — ROSUVASTATIN CALCIUM 40 MG PO TABS: 40.0000 mg | ORAL_TABLET | Freq: Every day | ORAL | 1 refills | Status: DC

## 2022-07-04 NOTE — Patient Instructions (Addendum)
Recommend change pravastatin to rosuvastatin 40 mg before bed.  Recommend change fish oil to vascepa 1 gm 2 oral twice daily.

## 2022-07-04 NOTE — Progress Notes (Signed)
Subjective:  Patient ID: William Clay, male    DOB: 09-Sep-1941  Age: 81 y.o. MRN: [id number]  Chief Complaint  Patient presents with   COPD   Hypertension   Hyperlipidemia    HPI Hypertension: Amlodipine 10 mg once daily, losartan 100 mg once daily.  COPD: on tiotropium and albuterol. Chronic respiratory failure with hypoxia/OSA: on CPAP: Uses oxygen at night and with exertion. Seeing [name], NP.Pulmonology in [place].  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 Doing well. Denies depression.    07/04/2022    3:40 PM 03/11/2022   10:37 AM 10/30/2021    9:03 AM  PHQ9 SCORE ONLY  PHQ-9 Total Score 0 0 0    Current Outpatient Medications on File Prior to Visit  Medication Sig Dispense Refill   albuterol (PROVENTIL HFA;VENTOLIN HFA) 108 (90 Base) MCG/ACT inhaler Inhale two puffs every four to six hours as needed for cough or wheeze. 1 Inhaler 1   amLODipine (NORVASC) 10 MG tablet TAKE 1 TABLET BY MOUTH EVERY DAY 90 tablet 1   aspirin EC 81 MG tablet Take 81 mg by mouth daily. Swallow whole.     chlorpheniramine (CHLOR-TRIMETON) 4 MG tablet Take 4 mg by mouth 2 (two) times daily as needed for allergies.     losartan (COZAAR) 100 MG tablet TAKE 1 TABLET BY MOUTH EVERY DAY 90 tablet 0   Multiple Vitamins-Minerals (MULTIVITAMIN WITH MINERALS) tablet Take 1 tablet by mouth daily.     nitroGLYCERIN (NITROSTAT) 0.4 MG SL tablet As directed for heart pain     omeprazole (PRILOSEC) 40 MG capsule TAKE 1 CAPSULE(40 MG) BY MOUTH IN THE MORNING AND AT BEDTIME 180 capsule 1   sertraline (ZOLOFT) 100 MG tablet TAKE 1 TABLET BY MOUTH ONCE DAILY 90 tablet 3   Tiotropium Bromide-Olodaterol 2.5-2.5 MCG/ACT AERS Inhale 2 puffs into the lungs daily.      No current facility-administered medications on  file prior to visit.   Past Medical History:  Diagnosis Date   Atherosclerosis of aorta (HCC)    Bowel obstruction ([place]) 2018   BPH (benign prostatic hyperplasia)    COPD (chronic obstructive pulmonary disease) (HCC)    COPD (chronic obstructive pulmonary disease) (HCC)    GERD (gastroesophageal reflux disease)    Hypertension    Hypoxemia    Lumbar stenosis with neurogenic claudication 04/24/2017   Mixed hyperlipidemia    Osteoarthritis    Pneumonia 2016,2017   Primary insomnia    Sleep apnea    Spontaneous ecchymoses    Past Surgical History:  Procedure Laterality Date   ABDOMINAL AORTIC ANEURYSM REPAIR     BACK SURGERY N/A [phone], 2021   Lower back and cervical spine   BACK SURGERY  1984   upper back   CATARACT EXTRACTION Left 2017   CATARACT EXTRACTION Right 2017   CHOLECYSTECTOMY  2018   HERNIA REPAIR  2018   PROSTATECTOMY     ROTATOR CUFF REPAIR Right 2015   ROTATOR CUFF REPAIR Left 2017    Family History  Problem Relation Age of Onset   Heart failure Mother    Diabetes Mother    CAD Mother    Hypertension Mother    Social History   Socioeconomic History   Marital status: Married  Spouse name: Vaughan Basta   Number of children: 2   Years of education: Not on file   Highest education level: Not on file  Occupational History   Occupation: retired    Comment: Event organiser   Occupation: Retired Agricultural engineer  Tobacco Use   Smoking status: Former    Packs/day: 2.00    Years: 50.00    Total pack years: 100.00    Types: Cigarettes    Quit date: 12/24/2007    Years since quitting: 14.5   Smokeless tobacco: Never  Vaping Use   Vaping Use: Never used  Substance and Sexual Activity   Alcohol use: No    Alcohol/week: 0.0 standard drinks of alcohol   Drug use: No   Sexual activity: Not on file  Other Topics Concern   Not on file  Social History Narrative   Not on file   Social Determinants of Health   Financial Resource  Strain: Not on file  Food Insecurity: No Food Insecurity (03/11/2022)   Hunger Vital Sign    Worried About Running Out of Food in the Last Year: Never true    [place] in the Last Year: Never true  Transportation Needs: No Transportation Needs (03/11/2022)   PRAPARE - Hydrologist (Medical): No    Lack of Transportation (Non-Medical): No  Physical Activity: Not on file  Stress: Not on file  Social Connections: Not on file    Review of Systems  Constitutional:  Negative for chills and fever.  HENT:  Negative for congestion, rhinorrhea and sore throat.   Respiratory:  Positive for cough, shortness of breath and wheezing.   Cardiovascular:  Positive for chest pain (tightness on left side and midsternal. dyspnea on exertion. no diaphoresis,nausea, and vomiting. Lasts few minutes, but take ntg immediately and resolves. Sched to see cardiology this month.). Negative for palpitations.  Gastrointestinal:  Positive for abdominal pain. Negative for constipation, diarrhea, nausea and vomiting.  Genitourinary:  Negative for dysuria and urgency.  Musculoskeletal:  Positive for arthralgias and back pain. Negative for myalgias. Joint swelling: hand pain. Neurological:  Positive for headaches. Negative for dizziness.  Psychiatric/Behavioral:  Negative for dysphoric mood. The patient is not nervous/anxious.      Objective:  BP 120/76   Pulse 72   Temp (!) 97.3 F (36.3 C)   Resp 18   Ht '5\' 7"'$  (1.702 m)   Wt 185 lb (83.9 kg)   BMI 28.98 kg/m      07/04/2022    3:31 PM 03/07/2022   10:28 AM 01/25/2022    9:23 AM  BP/Weight  Systolic BP 578 469   Diastolic BP 76 74   Wt. (Lbs) 185 186.4 180  BMI 28.98 kg/m2 27.53 kg/m2 26.58 kg/m2    Physical Exam Vitals reviewed.  Constitutional:      Appearance: Normal appearance.  Neck:     Vascular: No carotid bruit.  Cardiovascular:     Rate and Rhythm: Normal rate and regular rhythm.     Heart sounds: Normal  heart sounds.  Pulmonary:     Effort: Pulmonary effort is normal.     Breath sounds: Normal breath sounds. No wheezing, rhonchi or rales.  Abdominal:     General: Bowel sounds are normal.     Palpations: Abdomen is soft.     Tenderness: There is no abdominal tenderness.  Neurological:     Mental Status: He is alert.  Psychiatric:  Mood and Affect: Mood normal.        Behavior: Behavior normal.     Diabetic Foot Exam - Simple   No data filed      Lab Results  Component Value Date   WBC 10.2 07/02/2022   HGB 14.8 07/02/2022   HCT 45.1 07/02/2022   PLT 286 07/02/2022   GLUCOSE 92 07/02/2022   CHOL 161 07/02/2022   TRIG 170 (H) 07/02/2022   HDL 42 07/02/2022   LDLCALC 90 07/02/2022   ALT 22 07/02/2022   AST 25 07/02/2022   NA 140 07/02/2022   K 4.4 07/02/2022   CL 104 07/02/2022   CREATININE 0.94 07/02/2022   BUN 18 07/02/2022   CO2 19 (L) 07/02/2022   TSH 1.060 10/30/2021   HGBA1C 5.5 07/02/2022      Assessment & Plan:   Problem List Items Addressed This Visit       Cardiovascular and Mediastinum   Essential hypertension, benign    Well controlled.  No changes to medicines.  Continue to work on eating a healthy diet and exercise.        Relevant Medications   nitroGLYCERIN (NITROSTAT) 0.4 MG SL tablet   icosapent Ethyl (VASCEPA) 1 g capsule   rosuvastatin (CRESTOR) 40 MG tablet   Aortic atherosclerosis (HCC)    The current medical regimen is fairly effective;  continue present plan and medications. Recommend change pravastatin to rosuvastatin.  Change fish oil to vascepa.      Relevant Medications   nitroGLYCERIN (NITROSTAT) 0.4 MG SL tablet   icosapent Ethyl (VASCEPA) 1 g capsule   rosuvastatin (CRESTOR) 40 MG tablet     Respiratory   Chronic obstructive pulmonary disease (HCC)    Continue current medications.  Management per specialist.        Chronic respiratory failure with hypoxia (HCC)    Continue oxygen        Digestive    Gastroesophageal reflux disease    The current medical regimen is effective;  continue present plan and medications. Continue omeprazole 40 mg one twice daily         Endocrine   Impaired glucose tolerance    Recommend continue to work on eating healthy diet and exercise.        Other   Oxygen dependent    Continue oxygen 2 L at night.       Mild recurrent major depression (HCC)    The current medical regimen is effective;  continue present plan and medications. Continue zoloft 100 mg once daily      Mixed hyperlipidemia - Primary    Not at goal. Recommend change pravastatin to rosuvastatin 40 mg before bed.  Recommend change fish oil to vascepa 1 gm 2 oral twice daily.       Relevant Medications   nitroGLYCERIN (NITROSTAT) 0.4 MG SL tablet   icosapent Ethyl (VASCEPA) 1 g capsule   rosuvastatin (CRESTOR) 40 MG tablet   BMI 28.0-28.9,adult    Continue healthy diet.      .  Meds ordered this encounter  Medications   DISCONTD: rosuvastatin (CRESTOR) 40 MG tablet    Sig: Take 1 tablet (40 mg total) by mouth daily.    Dispense:  90 tablet    Refill:  1   DISCONTD: icosapent Ethyl (VASCEPA) 1 g capsule    Sig: Take 2 capsules (2 g total) by mouth 2 (two) times daily.    Dispense:  360 capsule    Refill:  1   icosapent Ethyl (VASCEPA) 1 g capsule    Sig: Take 2 capsules (2 g total) by mouth 2 (two) times daily.    Dispense:  360 capsule    Refill:  1   rosuvastatin (CRESTOR) 40 MG tablet    Sig: Take 1 tablet (40 mg total) by mouth daily.    Dispense:  90 tablet    Refill:  1   Follow-up: Return in about 3 months (around 10/04/2022) for chronic follow up, lab visit 2 days prior to visit.Marland Kitchen  An After Visit Summary was printed and given to the patient.  [name], MD [facility] [phone]

## 2022-07-05 NOTE — Assessment & Plan Note (Addendum)
Well controlled. No changes to medicines.  Continue to work on eating a healthy diet and exercise.    

## 2022-07-05 NOTE — Assessment & Plan Note (Addendum)
Not at goal. Recommend change pravastatin to rosuvastatin 40 mg before bed.  Recommend change fish oil to vascepa 1 gm 2 oral twice daily.

## 2022-07-05 NOTE — Assessment & Plan Note (Signed)
Recommend continue to work on eating healthy diet and exercise.  

## 2022-07-05 NOTE — Assessment & Plan Note (Addendum)
The current medical regimen is fairly effective;  continue present plan and medications. Recommend change pravastatin to rosuvastatin.  Change fish oil to vascepa.

## 2022-07-05 NOTE — Assessment & Plan Note (Addendum)
The current medical regimen is effective;  continue present plan and medications. Continue omeprazole 40 mg one twice daily

## 2022-07-14 ENCOUNTER — Encounter: Payer: Self-pay | Admitting: Family Medicine

## 2022-07-14 DIAGNOSIS — Z6828 Body mass index (BMI) 28.0-28.9, adult: Secondary | ICD-10-CM | POA: Insufficient documentation

## 2022-07-14 NOTE — Assessment & Plan Note (Signed)
Continue healthy diet

## 2022-07-14 NOTE — Assessment & Plan Note (Signed)
Continue oxygen 2 L at night.  

## 2022-07-14 NOTE — Assessment & Plan Note (Signed)
Continue current medications.  Management per specialist.

## 2022-07-14 NOTE — Assessment & Plan Note (Signed)
The current medical regimen is effective;  continue present plan and medications. Continue zoloft 100 mg once daily. 

## 2022-07-14 NOTE — Assessment & Plan Note (Addendum)
>>  ASSESSMENT AND PLAN FOR CHRONIC RESPIRATORY FAILURE WITH HYPOXIA (HCC) WRITTEN ON 07/14/2022  7:59 PM BY Javed Cotto, MD  Continue oxygen   >>ASSESSMENT AND PLAN FOR CHRONIC OBSTRUCTIVE PULMONARY DISEASE (HCC) WRITTEN ON 07/14/2022  7:56 PM BY Camielle Sizer, MD  Continue current medications.  Management per specialist.

## 2022-08-14 ENCOUNTER — Telehealth: Payer: Self-pay

## 2022-08-14 NOTE — Progress Notes (Signed)
Chronic Care Management Pharmacy Assistant   Name: William Clay  MRN: [id number] DOB: Feb 18, 1941   Reason for Encounter: General Adherence Call    Recent office visits:  07/04/22 [name] MD. Seen for routine visit. Started on Vascepa 2gm, Rosuvastatin Calcium '40mg'$ . D/C Plavix '75mg'$ , Omega 3, Pravastatin Sodium '40mg'$  and Pregablin '50mg'$ .   Recent consult visits:  08/07/22 (Vascular Surgery) Gavin Pound MD. Seen for PAD. No med changes.  07/22/22 ([place]) Documentation. Changed from pravastatin over to Crestor 40 mg about a week ago. Continues taking omeprazole, discussed with the veteran that this does have potential for interaction with Plavix and thus it will be changed over  to pantoprazole 40 mg.  [facility] No med changes.   05/13/22 (Cardiology) Loletta Specter MD. Seen for PAD. Ordered Nitroglycerin 0.'4mg'$   03/20/22 [facility]) Documentation. No med changes.   Hospital visits:  03/23/22- No notes available   Medications: Outpatient Encounter Medications as of 08/14/2022  Medication Sig   albuterol (PROVENTIL HFA;VENTOLIN HFA) 108 (90 Base) MCG/ACT inhaler Inhale two puffs every four to six hours as needed for cough or wheeze.   amLODipine (NORVASC) 10 MG tablet TAKE 1 TABLET BY MOUTH EVERY DAY   aspirin EC 81 MG tablet Take 81 mg by mouth daily. Swallow whole.   chlorpheniramine (CHLOR-TRIMETON) 4 MG tablet Take 4 mg by mouth 2 (two) times daily as needed for allergies.   icosapent Ethyl (VASCEPA) 1 g capsule Take 2 capsules (2 g total) by mouth 2 (two) times daily.   losartan (COZAAR) 100 MG tablet TAKE 1 TABLET BY MOUTH EVERY DAY   Multiple Vitamins-Minerals (MULTIVITAMIN WITH MINERALS) tablet Take 1 tablet by mouth daily.   nitroGLYCERIN (NITROSTAT) 0.4 MG SL tablet As directed for heart pain   omeprazole (PRILOSEC) 40 MG capsule TAKE 1 CAPSULE(40 MG) BY MOUTH IN THE MORNING AND AT BEDTIME   rosuvastatin (CRESTOR) 40 MG tablet Take 1 tablet (40 mg total)  by mouth daily.   sertraline (ZOLOFT) 100 MG tablet TAKE 1 TABLET BY MOUTH ONCE DAILY   Tiotropium Bromide-Olodaterol 2.5-2.5 MCG/ACT AERS Inhale 2 puffs into the lungs daily.    No facility-administered encounter medications on file as of 08/14/2022.    Contacted [name] for General Review Call   Chart Review:  Have there been any documented new, changed, or discontinued medications since last visit? Yes  Has there been any documented recent hospitalizations or ED visits since last visit with Clinical Pharmacist? Yes Brief Summary (including medication and/or Diagnosis changes):No notes available   Adherence Review:  Does the Clinical Pharmacist Assistant have access to adherence rates? Yes Adherence rates for STAR metric medications (List medication(s)/day supply/ last 2 fill dates). Losartan Rosuvastatin (Gets from [place]) Adherence rates for medications indicated for disease state being reviewed (List medication(s)/day supply/ last 2 fill dates). Does the patient have >5 day gap between last estimated fill dates for any of the above medications or other medication gaps? No   Disease State Questions:  Able to connect with Patient? Yes Did patient have any problems with their health recently? No  Have you had any admissions or emergency room visits or worsening of your condition(s) since last visit?  No  Have you had any visits with new specialists or providers since your last visit? Yes Explain:Pt had a stent put in his heart with his Cardiologist   Have you had any new health care problem(s) since your last visit? No  Have you run out  of any of your medications since you last spoke with clinical pharmacist? No  Are there any medications you are not taking as prescribed? No  Are you having any issues or side effects with your medications? No  Do you have any other health concerns or questions you want to discuss with your Clinical Pharmacist before your next visit?  No  Are there any health concerns that you feel we can do a better job addressing? No  Are you having any problems with any of the following since the last visit: (select all that apply)  Sleeping  Details:Pt has cramps in the middle of the night in his left leg mostly 12. Any falls since last visit? Yes  Details:Pt has fallen 3 times when he was working in the yard. He said his legs just gave out and ended up just going inside and resting.  13. Any increased or uncontrolled pain since last visit? No  14. Next visit Type: office       Visit with:Dr. Cox        Date:10/08/22        Time:2:00pm  15. Additional Details? No     Elray Mcgregor, [facility] [employer] manager  [phone]

## 2022-08-22 NOTE — Telephone Encounter (Signed)
Unable to reach patient, [facility].  William Clay, [place] 08/22/22 10:08 AM

## 2022-08-23 NOTE — Telephone Encounter (Signed)
Appointment made.  Royce Macadamia, [place] 08/23/22 8:32 AM

## 2022-08-30 ENCOUNTER — Encounter: Payer: Self-pay | Admitting: Family Medicine

## 2022-08-30 ENCOUNTER — Ambulatory Visit (INDEPENDENT_AMBULATORY_CARE_PROVIDER_SITE_OTHER): Payer: PPO | Admitting: Family Medicine

## 2022-08-30 VITALS — BP 110/80 | Temp 97.6°F | Resp 18 | Wt 186.0 lb

## 2022-08-30 DIAGNOSIS — I25118 Atherosclerotic heart disease of native coronary artery with other forms of angina pectoris: Secondary | ICD-10-CM | POA: Diagnosis not present

## 2022-08-30 DIAGNOSIS — I951 Orthostatic hypotension: Secondary | ICD-10-CM | POA: Diagnosis not present

## 2022-08-30 DIAGNOSIS — R42 Dizziness and giddiness: Secondary | ICD-10-CM

## 2022-08-30 DIAGNOSIS — R202 Paresthesia of skin: Secondary | ICD-10-CM | POA: Diagnosis not present

## 2022-08-30 DIAGNOSIS — R270 Ataxia, unspecified: Secondary | ICD-10-CM

## 2022-08-30 MED ORDER — AMLODIPINE BESYLATE 10 MG PO TABS: 5.0000 mg | ORAL_TABLET | Freq: Every day | ORAL | 0 refills | Status: DC

## 2022-08-30 NOTE — Patient Instructions (Signed)
Decrease amlodipine 10 mg 1/2 pill daily.  Continue isosorbide 30 mg daily.

## 2022-08-30 NOTE — Progress Notes (Unsigned)
Subjective:  Patient ID: William Clay, male    DOB: October 12, 1941  Age: 81 y.o. MRN: [id number]  Chief Complaint  Patient presents with   frequent falls    HPI  Patient is experiencing frequent falls. Fell last week. Felt light headed. Unable to get up. Had to wait for a while and then was able to get up. Back hurting.  CORONARY ARTERY DISEASE: left heart catheterization on 07/19/2022, which showed 90% ostial/proximal LCx lesion for which he underwent PCI/DES. Since the PCI, his chest pain is better, but was still having some exertional angina.  He was started on isosorbide mononitrate about 1-2 weeks ago. He has not had recurrent chest pain since starting on imdur. He is dizzy. For his hypertension he is on amlodipine 10 mg daily, olmesartan 100 mg daily, and imdur 30 mg daily.   Has fallen about 6 times this year.   Current Outpatient Medications on File Prior to Visit  Medication Sig Dispense Refill   isosorbide mononitrate (IMDUR) 30 MG 24 hr tablet Take 30 mg by mouth daily.     albuterol (PROVENTIL HFA;VENTOLIN HFA) 108 (90 Base) MCG/ACT inhaler Inhale two puffs every four to six hours as needed for cough or wheeze. 1 Inhaler 1   aspirin EC 81 MG tablet Take 81 mg by mouth daily. Swallow whole.     chlorpheniramine (CHLOR-TRIMETON) 4 MG tablet Take 4 mg by mouth 2 (two) times daily as needed for allergies.     icosapent Ethyl (VASCEPA) 1 g capsule Take 2 capsules (2 g total) by mouth 2 (two) times daily. 360 capsule 1   losartan (COZAAR) 100 MG tablet TAKE 1 TABLET BY MOUTH EVERY DAY 90 tablet 0   Multiple Vitamins-Minerals (MULTIVITAMIN WITH MINERALS) tablet Take 1 tablet by mouth daily.     nitroGLYCERIN (NITROSTAT) 0.4 MG SL tablet As directed for heart pain     omeprazole (PRILOSEC) 40 MG capsule TAKE 1 CAPSULE(40 MG) BY MOUTH IN THE MORNING AND AT BEDTIME 180 capsule 1   rosuvastatin (CRESTOR) 40 MG tablet Take 1 tablet (40 mg total) by mouth daily. 90 tablet 1   sertraline  (ZOLOFT) 100 MG tablet TAKE 1 TABLET BY MOUTH ONCE DAILY 90 tablet 3   Tiotropium Bromide-Olodaterol 2.5-2.5 MCG/ACT AERS Inhale 2 puffs into the lungs daily.      No current facility-administered medications on file prior to visit.   Past Medical History:  Diagnosis Date   Atherosclerosis of aorta (HCC)    Bowel obstruction ([place]) 2018   BPH (benign prostatic hyperplasia)    COPD (chronic obstructive pulmonary disease) (HCC)    COPD (chronic obstructive pulmonary disease) (HCC)    GERD (gastroesophageal reflux disease)    Hypertension    Hypoxemia    Lumbar stenosis with neurogenic claudication 04/24/2017   Mixed hyperlipidemia    Osteoarthritis    Pneumonia 2016,2017   Primary insomnia    Sleep apnea    Spontaneous ecchymoses    Past Surgical History:  Procedure Laterality Date   ABDOMINAL AORTIC ANEURYSM REPAIR     BACK SURGERY N/A [phone], 2021   Lower back and cervical spine   BACK SURGERY  1984   upper back   CATARACT EXTRACTION Left 2017   CATARACT EXTRACTION Right 2017   CHOLECYSTECTOMY  2018   HERNIA REPAIR  2018   PROSTATECTOMY     ROTATOR CUFF REPAIR Right 2015   ROTATOR CUFF REPAIR Left 2017    Family History  Problem Relation  Age of Onset   Heart failure Mother    Diabetes Mother    CAD Mother    Hypertension Mother    Social History   Socioeconomic History   Marital status: Married    Spouse name: Vaughan Basta   Number of children: 2   Years of education: Not on file   Highest education level: Not on file  Occupational History   Occupation: retired    Comment: Event organiser   Occupation: Retired Agricultural engineer  Tobacco Use   Smoking status: Former    Packs/day: 2.00    Years: 50.00    Total pack years: 100.00    Types: Cigarettes    Quit date: 12/24/2007    Years since quitting: 14.6   Smokeless tobacco: Never  Vaping Use   Vaping Use: Never used  Substance and Sexual Activity   Alcohol use: No    Alcohol/week:  0.0 standard drinks of alcohol   Drug use: No   Sexual activity: Not on file  Other Topics Concern   Not on file  Social History Narrative   Not on file   Social Determinants of Health   Financial Resource Strain: Not on file  Food Insecurity: No Food Insecurity (03/11/2022)   Hunger Vital Sign    Worried About Running Out of Food in the Last Year: Never true    [place] in the Last Year: Never true  Transportation Needs: No Transportation Needs (03/11/2022)   PRAPARE - Hydrologist (Medical): No    Lack of Transportation (Non-Medical): No  Physical Activity: Not on file  Stress: Not on file  Social Connections: Not on file    Review of Systems  Constitutional:  Positive for fatigue. Negative for chills and fever.  HENT:  Negative for congestion, rhinorrhea and sore throat.   Respiratory:  Positive for cough and shortness of breath.   Cardiovascular:  Positive for chest pain (controlled with medication changes.). Negative for palpitations.  Gastrointestinal:  Positive for abdominal pain. Negative for constipation, diarrhea, nausea and vomiting.  Genitourinary:  Negative for dysuria and urgency.  Musculoskeletal:  Positive for arthralgias (bilateral thumb pain) and back pain. Negative for myalgias.  Neurological:  Positive for dizziness and headaches.  Psychiatric/Behavioral:  Negative for dysphoric mood. The patient is not nervous/anxious.      Objective:  There were no vitals taken for this visit.     07/04/2022    3:31 PM 03/07/2022   10:28 AM 01/25/2022    9:23 AM  BP/Weight  Systolic BP 222 979   Diastolic BP 76 74   Wt. (Lbs) 185 186.4 180  BMI 28.98 kg/m2 27.53 kg/m2 26.58 kg/m2    Physical Exam Vitals reviewed.  Constitutional:      Appearance: Normal appearance.  Neck:     Vascular: No carotid bruit.  Cardiovascular:     Rate and Rhythm: Normal rate and regular rhythm.     Heart sounds: Normal heart sounds.  Pulmonary:      Effort: Pulmonary effort is normal.     Breath sounds: Normal breath sounds. No wheezing, rhonchi or rales.  Abdominal:     General: Bowel sounds are normal.     Palpations: Abdomen is soft.     Tenderness: There is no abdominal tenderness.  Neurological:     Mental Status: He is alert and oriented to person, place, and time.  Psychiatric:        Mood and Affect:  Mood normal.        Behavior: Behavior normal.     Diabetic Foot Exam - Simple   No data filed      Lab Results  Component Value Date   WBC 10.2 07/02/2022   HGB 14.8 07/02/2022   HCT 45.1 07/02/2022   PLT 286 07/02/2022   GLUCOSE 92 07/02/2022   CHOL 161 07/02/2022   TRIG 170 (H) 07/02/2022   HDL 42 07/02/2022   LDLCALC 90 07/02/2022   ALT 22 07/02/2022   AST 25 07/02/2022   NA 140 07/02/2022   K 4.4 07/02/2022   CL 104 07/02/2022   CREATININE 0.94 07/02/2022   BUN 18 07/02/2022   CO2 19 (L) 07/02/2022   TSH 1.060 10/30/2021   HGBA1C 5.5 07/02/2022      Assessment & Plan:   Problem List Items Addressed This Visit   None Visit Diagnoses     Lightheadedness    -  Primary   Relevant Orders   CBC with Differential/Platelet   Comprehensive metabolic panel   Ataxia       Relevant Orders   B12 and Folate Panel   Methylmalonic acid, serum   Paresthesia       Relevant Orders   B12 and Folate Panel   Methylmalonic acid, serum     .  Meds ordered this encounter  Medications   amLODipine (NORVASC) 10 MG tablet    Sig: Take 0.5 tablets (5 mg total) by mouth daily.    Dispense:  1 tablet    Refill:  0    Orders Placed This Encounter  Procedures   CBC with Differential/Platelet   Comprehensive metabolic panel   J88 and Folate Panel   Methylmalonic acid, serum     Follow-up: No follow-ups on file.  An After Visit Summary was printed and given to the patient.  [name], MD [facility] [phone]

## 2022-08-31 LAB — COMPREHENSIVE METABOLIC PANEL
ALT: 28 IU/L (ref 0–44)
AST: 37 IU/L (ref 0–40)
Albumin/Globulin Ratio: 2 (ref 1.2–2.2)
Albumin: 4.5 g/dL (ref 3.8–4.8)
Alkaline Phosphatase: 78 IU/L (ref 44–121)
BUN/Creatinine Ratio: 14 (ref 10–24)
BUN: 11 mg/dL (ref 8–27)
Bilirubin Total: 1 mg/dL (ref 0.0–1.2)
CO2: 18 mmol/L — ABNORMAL LOW (ref 20–29)
Calcium: 9.8 mg/dL (ref 8.6–10.2)
Chloride: 105 mmol/L (ref 96–106)
Creatinine, Ser: 0.81 mg/dL (ref 0.76–1.27)
Globulin, Total: 2.2 g/dL (ref 1.5–4.5)
Glucose: 89 mg/dL (ref 70–99)
Potassium: 4.2 mmol/L (ref 3.5–5.2)
Sodium: 139 mmol/L (ref 134–144)
Total Protein: 6.7 g/dL (ref 6.0–8.5)
eGFR: 90 mL/min/{1.73_m2} (ref >59)

## 2022-08-31 LAB — CBC WITH DIFFERENTIAL/PLATELET
Basophils Absolute: 0.1 10*3/uL (ref 0.0–0.2)
Basos: 1 %
EOS (ABSOLUTE): 0.2 10*3/uL (ref 0.0–0.4)
Eos: 2 %
Hematocrit: 42.6 % (ref 37.5–51.0)
Hemoglobin: 14 g/dL (ref 13.0–17.7)
Immature Grans (Abs): 0 10*3/uL (ref 0.0–0.1)
Immature Granulocytes: 0 %
Lymphocytes Absolute: 1.2 10*3/uL (ref 0.7–3.1)
Lymphs: 13 %
MCH: 32.2 pg (ref 26.6–33.0)
MCHC: 32.9 g/dL (ref 31.5–35.7)
MCV: 98 fL — ABNORMAL HIGH (ref 79–97)
Monocytes Absolute: 0.9 10*3/uL (ref 0.1–0.9)
Monocytes: 10 %
Neutrophils Absolute: 6.8 10*3/uL (ref 1.4–7.0)
Neutrophils: 74 %
Platelets: 251 10*3/uL (ref 150–450)
RBC: 4.35 x10E6/uL (ref 4.14–5.80)
RDW: 13.2 % (ref 11.6–15.4)
WBC: 9.1 10*3/uL (ref 3.4–10.8)

## 2022-09-01 ENCOUNTER — Encounter: Payer: Self-pay | Admitting: Family Medicine

## 2022-09-01 ENCOUNTER — Encounter (INDEPENDENT_AMBULATORY_CARE_PROVIDER_SITE_OTHER): Payer: Self-pay

## 2022-09-01 DIAGNOSIS — I951 Orthostatic hypotension: Secondary | ICD-10-CM | POA: Insufficient documentation

## 2022-09-01 DIAGNOSIS — R270 Ataxia, unspecified: Secondary | ICD-10-CM | POA: Insufficient documentation

## 2022-09-01 DIAGNOSIS — R202 Paresthesia of skin: Secondary | ICD-10-CM | POA: Insufficient documentation

## 2022-09-01 DIAGNOSIS — R42 Dizziness and giddiness: Secondary | ICD-10-CM | POA: Insufficient documentation

## 2022-09-01 NOTE — Assessment & Plan Note (Signed)
Check labs 

## 2022-09-01 NOTE — Assessment & Plan Note (Signed)
Secondary to hypotension.

## 2022-09-01 NOTE — Assessment & Plan Note (Signed)
Continue imdur, ntg, aspirin, crestor

## 2022-09-01 NOTE — Assessment & Plan Note (Signed)
Decrease amlodipine 10 mg 1/2 pill daily.  Continue isosorbide 30 mg daily and losartan 100 mg daily.

## 2022-09-02 LAB — B12 AND FOLATE PANEL
Folate: >20 ng/mL (ref >3.0)
Vitamin B-12: 651 pg/mL (ref 232–1245)

## 2022-09-02 LAB — METHYLMALONIC ACID, SERUM: Methylmalonic Acid: 180 nmol/L (ref 0–378)

## 2022-10-04 ENCOUNTER — Other Ambulatory Visit: Payer: PPO

## 2022-10-04 DIAGNOSIS — I10 Essential (primary) hypertension: Secondary | ICD-10-CM

## 2022-10-04 DIAGNOSIS — E782 Mixed hyperlipidemia: Secondary | ICD-10-CM

## 2022-10-05 LAB — LIPID PANEL
Chol/HDL Ratio: 2.9 ratio (ref 0.0–5.0)
Cholesterol, Total: 124 mg/dL (ref 100–199)
HDL: 43 mg/dL (ref >39)
LDL Chol Calc (NIH): 54 mg/dL (ref 0–99)
Triglycerides: 156 mg/dL — ABNORMAL HIGH (ref 0–149)
VLDL Cholesterol Cal: 27 mg/dL (ref 5–40)

## 2022-10-05 LAB — CBC WITH DIFF/PLATELET
Basophils Absolute: 0.1 10*3/uL (ref 0.0–0.2)
Basos: 1 %
EOS (ABSOLUTE): 0.2 10*3/uL (ref 0.0–0.4)
Eos: 2 %
Hematocrit: 43.7 % (ref 37.5–51.0)
Hemoglobin: 14.6 g/dL (ref 13.0–17.7)
Immature Grans (Abs): 0 10*3/uL (ref 0.0–0.1)
Immature Granulocytes: 0 %
Lymphocytes Absolute: 1.2 10*3/uL (ref 0.7–3.1)
Lymphs: 14 %
MCH: 32.3 pg (ref 26.6–33.0)
MCHC: 33.4 g/dL (ref 31.5–35.7)
MCV: 97 fL (ref 79–97)
Monocytes Absolute: 1.1 10*3/uL — ABNORMAL HIGH (ref 0.1–0.9)
Monocytes: 12 %
Neutrophils Absolute: 6.5 10*3/uL (ref 1.4–7.0)
Neutrophils: 71 %
Platelets: 259 10*3/uL (ref 150–450)
RBC: 4.52 x10E6/uL (ref 4.14–5.80)
RDW: 13.3 % (ref 11.6–15.4)
WBC: 9.1 10*3/uL (ref 3.4–10.8)

## 2022-10-05 LAB — COMPREHENSIVE METABOLIC PANEL
ALT: 26 IU/L (ref 0–44)
AST: 32 IU/L (ref 0–40)
Albumin/Globulin Ratio: 2 (ref 1.2–2.2)
Albumin: 4.5 g/dL (ref 3.8–4.8)
Alkaline Phosphatase: 77 IU/L (ref 44–121)
BUN/Creatinine Ratio: 16 (ref 10–24)
BUN: 15 mg/dL (ref 8–27)
Bilirubin Total: 0.7 mg/dL (ref 0.0–1.2)
CO2: 21 mmol/L (ref 20–29)
Calcium: 9.7 mg/dL (ref 8.6–10.2)
Chloride: 105 mmol/L (ref 96–106)
Creatinine, Ser: 0.95 mg/dL (ref 0.76–1.27)
Globulin, Total: 2.3 g/dL (ref 1.5–4.5)
Glucose: 92 mg/dL (ref 70–99)
Potassium: 4 mmol/L (ref 3.5–5.2)
Sodium: 142 mmol/L (ref 134–144)
Total Protein: 6.8 g/dL (ref 6.0–8.5)
eGFR: 81 mL/min/{1.73_m2} (ref >59)

## 2022-10-05 LAB — CARDIOVASCULAR RISK ASSESSMENT

## 2022-10-06 NOTE — Progress Notes (Signed)
Blood count normal.  Liver function normal.  Kidney function normal.  Appt later this week.

## 2022-10-08 ENCOUNTER — Ambulatory Visit (INDEPENDENT_AMBULATORY_CARE_PROVIDER_SITE_OTHER): Payer: PPO | Admitting: Family Medicine

## 2022-10-08 VITALS — BP 140/80 | HR 85 | Temp 97.5°F | Ht 68.0 in | Wt 188.0 lb

## 2022-10-08 DIAGNOSIS — R7302 Impaired glucose tolerance (oral): Secondary | ICD-10-CM | POA: Diagnosis not present

## 2022-10-08 DIAGNOSIS — J410 Simple chronic bronchitis: Secondary | ICD-10-CM

## 2022-10-08 DIAGNOSIS — E782 Mixed hyperlipidemia: Secondary | ICD-10-CM

## 2022-10-08 DIAGNOSIS — R2689 Other abnormalities of gait and mobility: Secondary | ICD-10-CM | POA: Diagnosis not present

## 2022-10-08 DIAGNOSIS — K219 Gastro-esophageal reflux disease without esophagitis: Secondary | ICD-10-CM | POA: Diagnosis not present

## 2022-10-08 DIAGNOSIS — R270 Ataxia, unspecified: Secondary | ICD-10-CM

## 2022-10-08 DIAGNOSIS — I700 Atherosclerosis of aorta: Secondary | ICD-10-CM

## 2022-10-08 DIAGNOSIS — I25118 Atherosclerotic heart disease of native coronary artery with other forms of angina pectoris: Secondary | ICD-10-CM | POA: Diagnosis not present

## 2022-10-08 DIAGNOSIS — R296 Repeated falls: Secondary | ICD-10-CM

## 2022-10-08 DIAGNOSIS — H722X1 Other marginal perforations of tympanic membrane, right ear: Secondary | ICD-10-CM | POA: Insufficient documentation

## 2022-10-08 DIAGNOSIS — H7291 Unspecified perforation of tympanic membrane, right ear: Secondary | ICD-10-CM | POA: Insufficient documentation

## 2022-10-08 MED ORDER — PANTOPRAZOLE SODIUM 40 MG PO TBEC: 40.0000 mg | DELAYED_RELEASE_TABLET | Freq: Two times a day (BID) | ORAL | 3 refills | Status: DC

## 2022-10-08 MED ORDER — ROSUVASTATIN CALCIUM 40 MG PO TABS: 40.0000 mg | ORAL_TABLET | Freq: Every day | ORAL | 1 refills | Status: AC

## 2022-10-08 NOTE — Progress Notes (Signed)
Subjective:  Patient ID: William Clay, male    DOB: [birth year]  Age: 81 y.o. MRN: [id number]  Chief Complaint  Patient presents with   COPD    COPD He complains of cough, shortness of breath and wheezing. Associated symptoms include headaches. Pertinent negatives include no chest pain or fever. His past medical history is significant for COPD.   Patient scheduled to see pulmonology tomorrow. Patient is on 2 L Oxygen continuous. Increases to 4 L when he goes out or he exerts himself.   HYPERTENSION: losartan 100 mg once daily, imdur 30 mg once daily in am, amlodipine 10 mg 1/2 pill daily.  NTG for chest pain. Baby aspirin 81 mg once daily.   Hyperlipidemia: crestor 40 mg once daily and vascepa 1 gm 2 capsules twice daily.   GERD: Experiencing increase in heart burn. On pantoprazole 40 mg once daily.    Current Outpatient Medications on File Prior to Visit  Medication Sig Dispense Refill   albuterol (PROVENTIL HFA;VENTOLIN HFA) 108 (90 Base) MCG/ACT inhaler Inhale two puffs every four to six hours as needed for cough or wheeze. 1 Inhaler 1   amLODipine (NORVASC) 10 MG tablet Take 0.5 tablets (5 mg total) by mouth daily. 1 tablet 0   aspirin EC 81 MG tablet Take 81 mg by mouth daily. Swallow whole.     chlorpheniramine (CHLOR-TRIMETON) 4 MG tablet Take 4 mg by mouth 2 (two) times daily as needed for allergies.     clopidogrel (PLAVIX) 75 MG tablet Take 1 tablet by mouth daily.     icosapent Ethyl (VASCEPA) 1 g capsule Take 2 capsules (2 g total) by mouth 2 (two) times daily. 360 capsule 1   isosorbide mononitrate (IMDUR) 30 MG 24 hr tablet Take 30 mg by mouth at bedtime.     losartan (COZAAR) 100 MG tablet TAKE 1 TABLET BY MOUTH EVERY DAY 90 tablet 0   Multiple Vitamins-Minerals (MULTIVITAMIN WITH MINERALS) tablet Take 1 tablet by mouth daily.     nitroGLYCERIN (NITROSTAT) 0.4 MG SL tablet As directed for heart pain     sertraline (ZOLOFT) 100 MG tablet TAKE 1 TABLET BY MOUTH ONCE  DAILY 90 tablet 3   Tiotropium Bromide-Olodaterol 2.5-2.5 MCG/ACT AERS Inhale 2 puffs into the lungs daily.      No current facility-administered medications on file prior to visit.   Past Medical History:  Diagnosis Date   Atherosclerosis of aorta (HCC)    Bowel obstruction ([place]) 2018   BPH (benign prostatic hyperplasia)    COPD (chronic obstructive pulmonary disease) (HCC)    COPD (chronic obstructive pulmonary disease) (HCC)    GERD (gastroesophageal reflux disease)    Hypertension    Hypoxemia    Lumbar stenosis with neurogenic claudication 04/24/2017   Mixed hyperlipidemia    Osteoarthritis    Pneumonia 2016,2017   Primary insomnia    Sleep apnea    Spontaneous ecchymoses    Past Surgical History:  Procedure Laterality Date   ABDOMINAL AORTIC ANEURYSM REPAIR     BACK SURGERY N/A [phone], 2021   Lower back and cervical spine   BACK SURGERY  1984   upper back   CATARACT EXTRACTION Left 2017   CATARACT EXTRACTION Right 2017   CHOLECYSTECTOMY  2018   HERNIA REPAIR  2018   PROSTATECTOMY     ROTATOR CUFF REPAIR Right 2015   ROTATOR CUFF REPAIR Left 2017    Family History  Problem Relation Age of Onset   Heart failure  Mother    Diabetes Mother    CAD Mother    Hypertension Mother    Social History   Socioeconomic History   Marital status: Married    Spouse name: Vaughan Basta   Number of children: 2   Years of education: Not on file   Highest education level: Not on file  Occupational History   Occupation: retired    Comment: Event organiser   Occupation: Retired Agricultural engineer  Tobacco Use   Smoking status: Former    Packs/day: 2.00    Years: 50.00    Total pack years: 100.00    Types: Cigarettes    Quit date: 12/24/2007    Years since quitting: 14.8   Smokeless tobacco: Never  Vaping Use   Vaping Use: Never used  Substance and Sexual Activity   Alcohol use: No    Alcohol/week: 0.0 standard drinks of alcohol   Drug use: No    Sexual activity: Not on file  Other Topics Concern   Not on file  Social History Narrative   Not on file   Social Determinants of Health   Financial Resource Strain: Not on file  Food Insecurity: No Food Insecurity (03/11/2022)   Hunger Vital Sign    Worried About Running Out of Food in the Last Year: Never true    [place] in the Last Year: Never true  Transportation Needs: No Transportation Needs (03/11/2022)   PRAPARE - Hydrologist (Medical): No    Lack of Transportation (Non-Medical): No  Physical Activity: Not on file  Stress: Not on file  Social Connections: Not on file    Review of Systems  Constitutional:  Negative for chills and fever.  Respiratory:  Positive for cough, shortness of breath and wheezing.   Cardiovascular:  Negative for chest pain and palpitations.  Gastrointestinal:  Positive for abdominal pain.  Neurological:  Positive for headaches.     Objective:  BP (!) 140/80 (BP Location: Right Arm, Cuff Size: Normal)   Pulse 85   Temp (!) 97.5 F (36.4 C)   Ht '5\' 8"'$  (1.727 m)   Wt 188 lb (85.3 kg)   SpO2 95%   BMI 28.59 kg/m      10/08/2022    2:49 PM 10/08/2022    1:54 PM 08/30/2022   11:15 AM  BP/Weight  Systolic BP 357 017 793  Diastolic BP 80 82 80  Wt. (Lbs)  188 186  BMI  28.59 kg/m2 29.13 kg/m2    Physical Exam Vitals reviewed.  Constitutional:      Appearance: Normal appearance.  Neck:     Vascular: No carotid bruit.  Cardiovascular:     Rate and Rhythm: Normal rate and regular rhythm.     Heart sounds: Normal heart sounds.  Pulmonary:     Effort: Pulmonary effort is normal.     Breath sounds: Normal breath sounds. No wheezing, rhonchi or rales.     Comments: Wearing oxygen.  Abdominal:     General: Bowel sounds are normal.     Palpations: Abdomen is soft.     Tenderness: There is no abdominal tenderness.  Neurological:     Mental Status: He is alert.  Psychiatric:        Mood and Affect:  Mood normal.        Behavior: Behavior normal.     Diabetic Foot Exam - Simple   No data filed      Lab Results  Component Value Date   WBC 9.1 10/04/2022   HGB 14.6 10/04/2022   HCT 43.7 10/04/2022   PLT 259 10/04/2022   GLUCOSE 92 10/04/2022   CHOL 124 10/04/2022   TRIG 156 (H) 10/04/2022   HDL 43 10/04/2022   LDLCALC 54 10/04/2022   ALT 26 10/04/2022   AST 32 10/04/2022   NA 142 10/04/2022   K 4.0 10/04/2022   CL 105 10/04/2022   CREATININE 0.95 10/04/2022   BUN 15 10/04/2022   CO2 21 10/04/2022   TSH 1.060 10/30/2021   HGBA1C 5.5 07/02/2022      Assessment & Plan:   Problem List Items Addressed This Visit       Cardiovascular and Mediastinum   Coronary artery disease of native artery of native heart with stable angina pectoris ([place])    The current medical regimen is effective;  continue present plan and medications.  Continue losartan 100 mg once daily, amlodipine 10 mg 1/2 pill daily.  Change Imdur 30 mg to p.m. dosing due to headaches.  NTG for chest pain. Baby aspirin 81 mg once daily.       Relevant Medications   rosuvastatin (CRESTOR) 40 MG tablet   Aortic atherosclerosis ([place])    .  Continue rosuvastatin, baby aspirin 81 mg daily.      Relevant Medications   rosuvastatin (CRESTOR) 40 MG tablet     Respiratory   Chronic obstructive pulmonary disease (HCC)    Continue tiotropium bromide-olodaterol 2.5-2.5 mcg 2 puffs daily. Continue albuterol.         Digestive   Gastroesophageal reflux disease    Uncontrolled.  Increase Protonix to 40 mg twice daily.      Relevant Medications   pantoprazole (PROTONIX) 40 MG tablet     Endocrine   Impaired glucose tolerance    Continue eating healthy.  Recommend exercise        Other   Mixed hyperlipidemia    Well controlled.  No changes to medicines. Continue crestor 40 mg once daily and vascepa 1 gm 2 capsules twice daily.  Continue to work on eating a healthy diet and exercise.  Labs  reviewed       Relevant Medications   rosuvastatin (CRESTOR) 40 MG tablet   Ataxia    Refer for physical therapy       Frequent falls    Refer for physical therapy.       Relevant Orders   Ambulatory referral to Physical Therapy   Imbalance - Primary    Refer for physical therapy       Relevant Orders   Ambulatory referral to Physical Therapy  .  Meds ordered this encounter  Medications   DISCONTD: pantoprazole (PROTONIX) 40 MG tablet    Sig: Take 1 tablet (40 mg total) by mouth 2 (two) times daily before a meal.    Dispense:  60 tablet    Refill:  3   rosuvastatin (CRESTOR) 40 MG tablet    Sig: Take 1 tablet (40 mg total) by mouth at bedtime.    Dispense:  90 tablet    Refill:  1   pantoprazole (PROTONIX) 40 MG tablet    Sig: Take 1 tablet (40 mg total) by mouth 2 (two) times daily before a meal.    Dispense:  180 tablet    Refill:  3    Orders Placed This Encounter  Procedures   Ambulatory referral to Physical Therapy     Follow-up: Return in about 4  months (around 02/08/2023) for chronic fasting.  An After Visit Summary was printed and given to the patient.  [name], MD [facility] [phone]

## 2022-10-08 NOTE — Patient Instructions (Addendum)
GERD:  Please increase pantoprazole 40 mg one twice a day.   Hypertension: change isosorbide mononitrate (IMDUR) to night time dosing.   Refer to physical therapy for frequent falls.

## 2022-10-13 ENCOUNTER — Encounter: Payer: Self-pay | Admitting: Family Medicine

## 2022-10-13 NOTE — Assessment & Plan Note (Signed)
Uncontrolled.  Increase Protonix to 40 mg twice daily.

## 2022-10-13 NOTE — Assessment & Plan Note (Addendum)
Refer for physical therapy

## 2022-10-13 NOTE — Assessment & Plan Note (Signed)
Continue tiotropium bromide-olodaterol 2.5-2.5 mcg 2 puffs daily. Continue albuterol.

## 2022-10-13 NOTE — Assessment & Plan Note (Signed)
Refer for physical therapy

## 2022-10-13 NOTE — Assessment & Plan Note (Signed)
Refer for physical therapy.

## 2022-10-13 NOTE — Assessment & Plan Note (Signed)
.    Continue rosuvastatin, baby aspirin 81 mg daily.

## 2022-10-13 NOTE — Assessment & Plan Note (Signed)
Continue eating healthy.  Recommend exercise

## 2022-10-13 NOTE — Assessment & Plan Note (Addendum)
Well controlled.  No changes to medicines. Continue crestor 40 mg once daily and vascepa 1 gm 2 capsules twice daily.  Continue to work on eating a healthy diet and exercise.  Labs reviewed

## 2022-10-13 NOTE — Assessment & Plan Note (Signed)
>>  ASSESSMENT AND PLAN FOR CHRONIC OBSTRUCTIVE PULMONARY DISEASE (HCC) WRITTEN ON 10/13/2022  9:12 PM BY COX, KIRSTEN, MD  Continue tiotropium bromide-olodaterol 2.5-2.5 mcg 2 puffs daily. Continue albuterol.

## 2022-10-13 NOTE — Assessment & Plan Note (Signed)
The current medical regimen is effective;  continue present plan and medications.  Continue losartan 100 mg once daily, amlodipine 10 mg 1/2 pill daily.  Change Imdur 30 mg to p.m. dosing due to headaches.  NTG for chest pain. Baby aspirin 81 mg once daily.

## 2022-11-25 ENCOUNTER — Telehealth: Payer: Self-pay | Admitting: *Deleted

## 2022-11-25 ENCOUNTER — Encounter: Payer: Self-pay | Admitting: *Deleted

## 2022-11-25 NOTE — Patient Outreach (Signed)
Care Coordination [facility] Note Transition Care Management Follow-up Telephone Call Date of discharge and from where: Friday, 11/22/22- [facility]/ [place]; COPD exacerbation/ acute of chronic respiratory failure with hypoxia How have you been since you were released from the hospital? Per spouse/ caregiver [place], on [facility] DPR: "He is doing better but is a little weak still.  He can get up and do most everything he needs to on his own.  The physical therapist came this morning.  He finished his prednisone and is using his oxygen and the nebulizer and the IS thing.  I think they told us to use the home oxygen like he always does at 3 L/ min.... We go to see his PCP at the [place] on Friday, either I or my son will take him.  He got his flu vaccine at the [place]" Any questions or concerns? No  Items Reviewed: Did the pt receive and understand the discharge instructions provided? Yes  Medications obtained and verified? Yes  Other? No  Any new allergies since your discharge? No  Dietary orders reviewed? Yes Do you have support at home? Yes  spouse/ caregiver Vaughan Basta assisting with care needs as indicated; reports patient is essentially independent in care needs post- recent hospitalization  Home Care and Equipment/Supplies: Were home health services ordered? yes If so, what is the name of the agency? [facility]  Has the agency set up a time to come to the patient's home? Yes-- had first  Were any new equipment or medical supplies ordered?  No What is the name of the medical supply agency? N/A Were you able to get the supplies/equipment? not applicable Do you have any questions related to the use of the equipment or supplies? No  Functional Questionnaire: (I = Independent and D = Dependent) ADLs: I  spouse/ caregiver Vaughan Basta assisting with care needs as indicated  Bathing/Dressing- I  spouse/ caregiver Vaughan Basta assisting with care needs as indicated  Meal Prep- I  spouse/ caregiver Vaughan Basta assisting with care needs  as indicated  Eating- I  Maintaining continence- I  Transferring/Ambulation- I  Managing Meds- I  Follow up appointments reviewed:  PCP Hospital f/u appt confirmed? Yes  [facility] @ 11:30 am. [facility] f/u appt confirmed? No  Scheduled to see - on - @ - Are transportation arrangements needed? No  If their condition worsens, is the pt aware to call PCP or go to the Emergency Dept.? Yes Was the patient provided with contact information for the PCP's office or ED? No- spouse declines and confirms she already has contact information for all care providers Was to pt encouraged to call back with questions or concerns? Yes  SDOH assessments and interventions completed:   Yes SDOH Interventions Today    Flowsheet Row Most Recent Value  SDOH Interventions   Food Insecurity Interventions Intervention Not Indicated  Transportation Interventions Intervention Not Indicated  [patient normally drives self,  spouse assisting post- recent hospitalization]      Care Coordination Interventions:  Depression screening completed/ updated; provided education around safe use of home O2 at home and need to maintain oxygen dosing at lowest setting possible as per baseline, potential complications around increasing oxygen levels too high; discussed infection prevention strategies and confirmed patient has had flu vaccine for 2023-24 flu/ winter season; provided education around RSV and updated COVID vaccines and encouraged patient to discuss need for these vaccines at time of upcoming PCP office visit at [facility]    Encounter Outcome:  Pt. Visit Completed    Oneta Rack, RN, BSN, CCRN Alumnus RN CM Care Coordination/ Transition of [place] Management [phone]: direct office

## 2023-02-08 ENCOUNTER — Other Ambulatory Visit: Payer: Self-pay

## 2023-02-08 DIAGNOSIS — I10 Essential (primary) hypertension: Secondary | ICD-10-CM

## 2023-02-08 DIAGNOSIS — E782 Mixed hyperlipidemia: Secondary | ICD-10-CM

## 2023-02-08 DIAGNOSIS — R7302 Impaired glucose tolerance (oral): Secondary | ICD-10-CM

## 2023-02-10 ENCOUNTER — Other Ambulatory Visit: Payer: HMO

## 2023-02-10 DIAGNOSIS — J309 Allergic rhinitis, unspecified: Secondary | ICD-10-CM | POA: Diagnosis not present

## 2023-02-10 DIAGNOSIS — Z7902 Long term (current) use of antithrombotics/antiplatelets: Secondary | ICD-10-CM | POA: Diagnosis not present

## 2023-02-10 DIAGNOSIS — E663 Overweight: Secondary | ICD-10-CM | POA: Diagnosis not present

## 2023-02-10 DIAGNOSIS — R7302 Impaired glucose tolerance (oral): Secondary | ICD-10-CM | POA: Diagnosis not present

## 2023-02-10 DIAGNOSIS — G4733 Obstructive sleep apnea (adult) (pediatric): Secondary | ICD-10-CM | POA: Diagnosis not present

## 2023-02-10 DIAGNOSIS — I10 Essential (primary) hypertension: Secondary | ICD-10-CM | POA: Diagnosis not present

## 2023-02-10 DIAGNOSIS — E782 Mixed hyperlipidemia: Secondary | ICD-10-CM | POA: Diagnosis not present

## 2023-02-10 DIAGNOSIS — H9193 Unspecified hearing loss, bilateral: Secondary | ICD-10-CM | POA: Diagnosis not present

## 2023-02-10 DIAGNOSIS — J449 Chronic obstructive pulmonary disease, unspecified: Secondary | ICD-10-CM | POA: Diagnosis not present

## 2023-02-10 DIAGNOSIS — G8929 Other chronic pain: Secondary | ICD-10-CM | POA: Diagnosis not present

## 2023-02-10 DIAGNOSIS — K219 Gastro-esophageal reflux disease without esophagitis: Secondary | ICD-10-CM | POA: Diagnosis not present

## 2023-02-10 DIAGNOSIS — F3341 Major depressive disorder, recurrent, in partial remission: Secondary | ICD-10-CM | POA: Diagnosis not present

## 2023-02-10 DIAGNOSIS — Z7982 Long term (current) use of aspirin: Secondary | ICD-10-CM | POA: Diagnosis not present

## 2023-02-10 DIAGNOSIS — E785 Hyperlipidemia, unspecified: Secondary | ICD-10-CM | POA: Diagnosis not present

## 2023-02-11 LAB — COMPREHENSIVE METABOLIC PANEL
ALT: 26 IU/L (ref 0–44)
AST: 35 IU/L (ref 0–40)
Albumin/Globulin Ratio: 2.1 (ref 1.2–2.2)
Albumin: 4.6 g/dL (ref 3.8–4.8)
Alkaline Phosphatase: 89 IU/L (ref 44–121)
BUN/Creatinine Ratio: 23 (ref 10–24)
BUN: 20 mg/dL (ref 8–27)
Bilirubin Total: 0.8 mg/dL (ref 0.0–1.2)
CO2: 19 mmol/L — ABNORMAL LOW (ref 20–29)
Calcium: 9.9 mg/dL (ref 8.6–10.2)
Chloride: 105 mmol/L (ref 96–106)
Creatinine, Ser: 0.87 mg/dL (ref 0.76–1.27)
Globulin, Total: 2.2 g/dL (ref 1.5–4.5)
Glucose: 77 mg/dL (ref 70–99)
Potassium: 4.4 mmol/L (ref 3.5–5.2)
Sodium: 140 mmol/L (ref 134–144)
Total Protein: 6.8 g/dL (ref 6.0–8.5)
eGFR: 88 mL/min/{1.73_m2} (ref >59)

## 2023-02-11 LAB — CBC WITH DIFFERENTIAL/PLATELET
Basophils Absolute: 0.1 10*3/uL (ref 0.0–0.2)
Basos: 1 %
EOS (ABSOLUTE): 0.4 10*3/uL (ref 0.0–0.4)
Eos: 4 %
Hematocrit: 44.4 % (ref 37.5–51.0)
Hemoglobin: 14.6 g/dL (ref 13.0–17.7)
Immature Grans (Abs): 0 10*3/uL (ref 0.0–0.1)
Immature Granulocytes: 0 %
Lymphocytes Absolute: 0.7 10*3/uL (ref 0.7–3.1)
Lymphs: 6 %
MCH: 31.4 pg (ref 26.6–33.0)
MCHC: 32.9 g/dL (ref 31.5–35.7)
MCV: 96 fL (ref 79–97)
Monocytes Absolute: 1 10*3/uL — ABNORMAL HIGH (ref 0.1–0.9)
Monocytes: 8 %
Neutrophils Absolute: 9.6 10*3/uL — ABNORMAL HIGH (ref 1.4–7.0)
Neutrophils: 81 %
Platelets: 281 10*3/uL (ref 150–450)
RBC: 4.65 x10E6/uL (ref 4.14–5.80)
RDW: 13.2 % (ref 11.6–15.4)
WBC: 11.9 10*3/uL — ABNORMAL HIGH (ref 3.4–10.8)

## 2023-02-11 LAB — LIPID PANEL
Chol/HDL Ratio: 2.5 ratio (ref 0.0–5.0)
Cholesterol, Total: 122 mg/dL (ref 100–199)
HDL: 48 mg/dL (ref >39)
LDL Chol Calc (NIH): 56 mg/dL (ref 0–99)
Triglycerides: 96 mg/dL (ref 0–149)
VLDL Cholesterol Cal: 18 mg/dL (ref 5–40)

## 2023-02-11 LAB — HEMOGLOBIN A1C
Est. average glucose Bld gHb Est-mCnc: 108 mg/dL
Hgb A1c MFr Bld: 5.4 % (ref 4.8–5.6)

## 2023-02-11 LAB — CARDIOVASCULAR RISK ASSESSMENT

## 2023-02-13 ENCOUNTER — Encounter: Payer: Self-pay | Admitting: Family Medicine

## 2023-02-13 ENCOUNTER — Ambulatory Visit (INDEPENDENT_AMBULATORY_CARE_PROVIDER_SITE_OTHER): Payer: HMO | Admitting: Family Medicine

## 2023-02-13 VITALS — BP 128/82 | HR 90 | Temp 97.1°F | Ht 68.0 in | Wt 174.0 lb

## 2023-02-13 DIAGNOSIS — Z6826 Body mass index (BMI) 26.0-26.9, adult: Secondary | ICD-10-CM

## 2023-02-13 DIAGNOSIS — E782 Mixed hyperlipidemia: Secondary | ICD-10-CM

## 2023-02-13 DIAGNOSIS — I10 Essential (primary) hypertension: Secondary | ICD-10-CM | POA: Diagnosis not present

## 2023-02-13 DIAGNOSIS — F330 Major depressive disorder, recurrent, mild: Secondary | ICD-10-CM

## 2023-02-13 DIAGNOSIS — E663 Overweight: Secondary | ICD-10-CM

## 2023-02-13 DIAGNOSIS — I25118 Atherosclerotic heart disease of native coronary artery with other forms of angina pectoris: Secondary | ICD-10-CM | POA: Diagnosis not present

## 2023-02-13 DIAGNOSIS — R296 Repeated falls: Secondary | ICD-10-CM

## 2023-02-13 DIAGNOSIS — K219 Gastro-esophageal reflux disease without esophagitis: Secondary | ICD-10-CM

## 2023-02-13 DIAGNOSIS — J410 Simple chronic bronchitis: Secondary | ICD-10-CM | POA: Diagnosis not present

## 2023-02-13 DIAGNOSIS — Z9981 Dependence on supplemental oxygen: Secondary | ICD-10-CM

## 2023-02-13 DIAGNOSIS — R2689 Other abnormalities of gait and mobility: Secondary | ICD-10-CM | POA: Diagnosis not present

## 2023-02-13 NOTE — Assessment & Plan Note (Signed)
Will increase zoloft to 200 mg daily. [place] on 02/19/2023.

## 2023-02-13 NOTE — Progress Notes (Signed)
Subjective:  Patient ID: William Clay, male    DOB: [birth year]  Age: 82 y.o. MRN: [id number]  Chief Complaint  Patient presents with   Hyperlipidemia   Hypertension   Gastroesophageal Reflux   Anxiety    HPI HTN: Norvasc 0.5 mg tablets , Asa 81 and Plavix, Losartan 100 mg daily  Hyperlipidemia: Crestor 40 mg daily  GERD: Protonix 40 mg daily  Anxiety/Depression: 150 mg daily. Has appt with Mental Health on the 03-16-2023. His wife died 1-2 weeks ago.      Dec 20, 2022   11:06 AM 08/30/2022   11:46 AM 07/04/2022    4:02 PM 07/04/2022    3:40 PM 03/11/2022   10:37 AM  Depression screen PHQ 2/9  Decreased Interest 1 0  0 0  Down, Depressed, Hopeless 0 0  0 0  PHQ - 2 Score 1 0  0 0  Altered sleeping    0   Tired, decreased energy    0   Change in appetite    0   Feeling bad or failure about yourself     0   Trouble concentrating    0   Moving slowly or fidgety/restless    0   Suicidal thoughts    0   PHQ-9 Score    0   Difficult doing work/chores   Not difficult at all           07/04/2022    3:41 PM 07/04/2022    3:59 PM 08/30/2022   11:46 AM 10/08/2022    2:05 PM 02/13/2023    1:42 PM  Fall Risk  Falls in the past year? '1  1 1 1  '$ Was there an injury with Fall? 0  1 0 0  Fall Risk Category Calculator '2  3 2 2  '$ Fall Risk Category (Retired) Moderate  High Moderate   (RETIRED) Patient Fall Risk Level Moderate fall risk  Moderate fall risk    Patient at Risk for Falls Due to History of fall(s)  History of fall(s);Impaired balance/gait History of fall(s) Impaired balance/gait;History of fall(s)  Fall risk Follow up Falls evaluation completed Falls prevention discussed;Education provided;Falls evaluation completed Falls evaluation completed  Falls evaluation completed      Review of Systems  Constitutional:  Negative for chills, diaphoresis, fatigue and fever.  HENT:  Positive for sore throat. Negative for congestion and ear pain.   Respiratory:  Negative for cough and  shortness of breath.   Cardiovascular:  Negative for chest pain and leg swelling.  Gastrointestinal:  Negative for abdominal pain, constipation, diarrhea, nausea and vomiting.  Genitourinary:  Negative for dysuria and urgency.  Musculoskeletal:  Negative for arthralgias and myalgias.  Neurological:  Negative for dizziness and headaches.  Psychiatric/Behavioral:  Negative for dysphoric mood ([place]).     Current Outpatient Medications on File Prior to Visit  Medication Sig Dispense Refill   albuterol (PROVENTIL HFA;VENTOLIN HFA) 108 (90 Base) MCG/ACT inhaler Inhale two puffs every four to six hours as needed for cough or wheeze. 1 Inhaler 1   amLODipine (NORVASC) 10 MG tablet Take 0.5 tablets (5 mg total) by mouth daily. 1 tablet 0   aspirin EC 81 MG tablet Take 81 mg by mouth daily. Swallow whole.     chlorpheniramine (CHLOR-TRIMETON) 4 MG tablet Take 4 mg by mouth 2 (two) times daily as needed for allergies.     clopidogrel (PLAVIX) 75 MG tablet Take 1 tablet by mouth daily.     isosorbide mononitrate (  IMDUR) 30 MG 24 hr tablet Take 30 mg by mouth at bedtime.     losartan (COZAAR) 100 MG tablet TAKE 1 TABLET BY MOUTH EVERY DAY 90 tablet 0   Multiple Vitamins-Minerals (MULTIVITAMIN WITH MINERALS) tablet Take 1 tablet by mouth daily.     nitroGLYCERIN (NITROSTAT) 0.4 MG SL tablet As directed for heart pain     pantoprazole (PROTONIX) 40 MG tablet Take 1 tablet (40 mg total) by mouth 2 (two) times daily before a meal. 180 tablet 3   rosuvastatin (CRESTOR) 40 MG tablet Take 1 tablet (40 mg total) by mouth at bedtime. 90 tablet 1   sertraline (ZOLOFT) 100 MG tablet TAKE 1 TABLET BY MOUTH ONCE DAILY (Patient taking differently: Taking 1.5 tablets daily) 90 tablet 3   Tiotropium Bromide-Olodaterol 2.5-2.5 MCG/ACT AERS Inhale 2 puffs into the lungs daily.      No current facility-administered medications on file prior to visit.   Past Medical History:  Diagnosis Date   Atherosclerosis of  aorta (HCC)    Bowel obstruction ([place]) 2018   BPH (benign prostatic hyperplasia)    COPD (chronic obstructive pulmonary disease) (HCC)    COPD (chronic obstructive pulmonary disease) (HCC)    GERD (gastroesophageal reflux disease)    Hypertension    Hypoxemia    Lumbar stenosis with neurogenic claudication 04/24/2017   Mixed hyperlipidemia    Osteoarthritis    Pneumonia 2016,2017   Primary insomnia    Sleep apnea    Spontaneous ecchymoses    Past Surgical History:  Procedure Laterality Date   ABDOMINAL AORTIC ANEURYSM REPAIR     BACK SURGERY N/A [phone], 2021   Lower back and cervical spine   BACK SURGERY  1984   upper back   CATARACT EXTRACTION Left 2017   CATARACT EXTRACTION Right 2017   CHOLECYSTECTOMY  2018   HERNIA REPAIR  2018   PROSTATECTOMY     ROTATOR CUFF REPAIR Right 2015   ROTATOR CUFF REPAIR Left 2017    Family History  Problem Relation Age of Onset   Heart failure Mother    Diabetes Mother    CAD Mother    Hypertension Mother    Social History   Socioeconomic History   Marital status: Married    Spouse name: Vaughan Basta   Number of children: 2   Years of education: Not on file   Highest education level: Not on file  Occupational History   Occupation: retired    Comment: Event organiser   Occupation: Retired Agricultural engineer  Tobacco Use   Smoking status: Former    Packs/day: 2.00    Years: 50.00    Total pack years: 100.00    Types: Cigarettes    Quit date: 12/24/2007    Years since quitting: 15.1   Smokeless tobacco: Never  Vaping Use   Vaping Use: Never used  Substance and Sexual Activity   Alcohol use: No    Alcohol/week: 0.0 standard drinks of alcohol   Drug use: No   Sexual activity: Not on file  Other Topics Concern   Not on file  Social History Narrative   Not on file   Social Determinants of Health   Financial Resource Strain: Low Risk  (02/13/2023)   Overall Financial Resource Strain (CARDIA)     Difficulty of Paying Living Expenses: Not hard at all  Food Insecurity: No Food Insecurity (11/25/2022)   Hunger Vital Sign    Worried About Running Out of Food in the Last Year:  Never true    Ran Out of Food in the Last Year: Never true  Transportation Needs: No Transportation Needs (11/25/2022)   PRAPARE - Hydrologist (Medical): No    Lack of Transportation (Non-Medical): No  Physical Activity: Inactive (02/13/2023)   Exercise Vital Sign    Days of Exercise per Week: 0 days    Minutes of Exercise per Session: 0 min  Stress: No Stress Concern Present (02/13/2023)   [place]    Feeling of Stress : Not at all  Social Connections: Moderately Isolated (02/13/2023)   Social Connection and Isolation Panel [NHANES]    Frequency of Communication with Friends and Family: Three times a week    Frequency of Social Gatherings with Friends and Family: Three times a week    Attends Religious Services: More than 4 times per year    Active Member of Clubs or Organizations: No    Attends Archivist Meetings: Never    Marital Status: Widowed    Objective:  BP 128/82   Pulse 90   Temp (!) 97.1 F (36.2 C)   Ht '5\' 8"'$  (1.727 m)   Wt 174 lb (78.9 kg)   SpO2 92% Comment: 3 L O2  BMI 26.46 kg/m      02/13/2023    1:29 PM 10/08/2022    2:49 PM 10/08/2022    1:54 PM  BP/Weight  Systolic BP 0000000 [id number]  Diastolic BP 82 80 82  Wt. (Lbs) 174  188  BMI 26.46 kg/m2  28.59 kg/m2    Physical Exam Vitals reviewed.  Constitutional:      Appearance: Normal appearance. He is normal weight.  HENT:     Ears:     Comments: Hearing aids BL.     Mouth/Throat:     Mouth: Mucous membranes are dry.     Pharynx: Posterior oropharyngeal erythema present.  Neck:     Vascular: No carotid bruit.  Cardiovascular:     Rate and Rhythm: Normal rate and regular rhythm.     Heart sounds: Normal heart sounds.  No murmur heard. Pulmonary:     Effort: Pulmonary effort is normal.     Breath sounds: Normal breath sounds.  Abdominal:     General: Abdomen is flat. Bowel sounds are normal.     Palpations: Abdomen is soft.     Tenderness: There is no abdominal tenderness.  Neurological:     Mental Status: He is alert and oriented to person, place, and time.  Psychiatric:        Mood and Affect: Mood normal.        Behavior: Behavior normal.     Diabetic Foot Exam - Simple   No data filed      Lab Results  Component Value Date   WBC 11.9 (H) 02/10/2023   HGB 14.6 02/10/2023   HCT 44.4 02/10/2023   PLT 281 02/10/2023   GLUCOSE 77 02/10/2023   CHOL 122 02/10/2023   TRIG 96 02/10/2023   HDL 48 02/10/2023   LDLCALC 56 02/10/2023   ALT 26 02/10/2023   AST 35 02/10/2023   NA 140 02/10/2023   K 4.4 02/10/2023   CL 105 02/10/2023   CREATININE 0.87 02/10/2023   BUN 20 02/10/2023   CO2 19 (L) 02/10/2023   TSH 1.060 10/30/2021   HGBA1C 5.4 02/10/2023      Assessment & Plan:    Mixed hyperlipidemia  Assessment & Plan: Well controlled.  No changes to medicines. Continue crestor 40 mg once daily. Continue to work on eating a healthy diet and exercise.  Labs reviewed    Essential hypertension, benign Assessment & Plan: Well controlled.  No medication changes recommended. Continue healthy diet and exercise.     Frequent falls Assessment & Plan: Use wheelchair    Imbalance Assessment & Plan: Use wheelchair    Coronary artery disease of native artery of native heart with stable angina pectoris [facility]) Assessment & Plan: The current medical regimen is effective;  continue present plan and medications.  Continue Imdur 30 mg p.m. dosing due to headaches.  NTG for chest pain. Baby aspirin 81 mg once daily.    Gastroesophageal reflux disease without esophagitis Assessment & Plan: Continue current medication   Mild recurrent major depression ([place]) Assessment & Plan: Will  increase zoloft to 200 mg daily. [place] on 02/19/2023.   Oxygen dependent Assessment & Plan: Continue oxygen 2 L at night.    Overweight with body mass index (BMI) of 26 to 26.9 in adult Assessment & Plan: Recommend continue to work on eating healthy diet and exercise.    Simple chronic bronchitis (HCC) Assessment & Plan: Rinse mouth after using nebulizer or inhaler Gargle with warm salt water        No orders of the defined types were placed in this encounter.   No orders of the defined types were placed in this encounter.    Follow-up: Return in about 3 months (around 05/14/2023) for chronic, fasting.  I,Marla I Leal-Borjas,acting as a scribe for [name], MD.,have documented all relevant documentation on the behalf of [name], MD,as directed by  [name], MD while in the presence of [name], MD.    An After Visit Summary was printed and given to the patient.  I attest that I have reviewed this visit and agree with the plan scribed by my staff.   [name], MD [facility] [phone]

## 2023-02-13 NOTE — Assessment & Plan Note (Signed)
Use wheelchair

## 2023-02-13 NOTE — Patient Instructions (Signed)
Rinse mouth after using nebulizer or inhaler Gargle with warm salt water  Increase zoloft to 200 mg daily

## 2023-02-13 NOTE — Assessment & Plan Note (Signed)
Continue current medication.

## 2023-02-13 NOTE — Assessment & Plan Note (Signed)
Well controlled.  No medication changes recommended. Continue healthy diet and exercise.  

## 2023-02-16 DIAGNOSIS — Z6826 Body mass index (BMI) 26.0-26.9, adult: Secondary | ICD-10-CM | POA: Insufficient documentation

## 2023-02-16 NOTE — Assessment & Plan Note (Signed)
Recommend continue to work on eating healthy diet and exercise.  

## 2023-02-16 NOTE — Assessment & Plan Note (Signed)
Rinse mouth after using nebulizer or inhaler Gargle with warm salt water

## 2023-02-16 NOTE — Assessment & Plan Note (Signed)
The current medical regimen is effective;  continue present plan and medications.  Continue Imdur 30 mg p.m. dosing due to headaches.  NTG for chest pain. Baby aspirin 81 mg once daily.

## 2023-02-16 NOTE — Assessment & Plan Note (Signed)
Continue oxygen 2 L at night.

## 2023-02-16 NOTE — Assessment & Plan Note (Signed)
>>  ASSESSMENT AND PLAN FOR CHRONIC OBSTRUCTIVE PULMONARY DISEASE (HCC) WRITTEN ON 02/16/2023  2:46 PM BY LEAL-BORJAS, Ivionna Verley I, CMA  Rinse mouth after using nebulizer or inhaler Gargle with warm salt water

## 2023-02-16 NOTE — Assessment & Plan Note (Signed)
Well controlled.  No changes to medicines. Continue crestor 40 mg once daily. Continue to work on eating a healthy diet and exercise.  Labs reviewed

## 2023-02-16 NOTE — Assessment & Plan Note (Signed)
Use wheelchair

## 2023-03-11 ENCOUNTER — Telehealth: Payer: Self-pay

## 2023-03-11 NOTE — Progress Notes (Signed)
Care Management & Coordination Services Pharmacy Team  Reason for Encounter: General adherence update   Contacted patient for general health update and medication adherence call.  Spoke with patient on 03/11/2023    What concerns do you have about your medications? Patient stated no  The patient denies side effects with their medications.   How often do you forget or accidentally miss a dose? Never  Do you use a pillbox? No  Are you having any problems getting your medications from your pharmacy? No  Has the cost of your medications been a concern? No  Since last visit with PharmD, no interventions have been made.   The patient has had an ED visit since last contact.   The patient denies problems with their health.   Patient denies concerns or questions for [place] Constable, PharmD at this time.   Counseled patient on: Great job taking medications   Chart Updates: Recent office visits:  02-13-2023 [name], MD. STOP vascepa.  Recent consult visits:  01-17-2023 Salvadore Dom [facility]). Visit for smoking sensation. Follow up in 3 months.  01-01-2023 Loletta Specter, MD (Cardiology). Follow up visit.  12-26-2022 Cathlyn Parsons ([place]). Covid booster and flu vaccine given.  12-04-2022 Theotis Barrio ([place]). Follow up visit  11-29-2022 Cathlyn Parsons [facility]). Follow up visit.  Hospital visits:  Medication Reconciliation was completed by comparing discharge summary, patient's EMR and Pharmacy list, and upon discussion with patient.  Admitted to the hospital on 11-17-2022 due to Acute on chronic respiratory failure with hypoxia. Discharge date was 11-22-2022. Discharged from [facility] ICU.    New?Medications Started at [facility] Discharge:?? None  Medication Changes at Hospital Discharge: None  Medications Discontinued at Hospital Discharge: None  Medications that remain the same after Hospital Discharge:??  -All other medications will remain the same.     Medications: Outpatient Encounter Medications as of 03/11/2023  Medication Sig   albuterol (PROVENTIL HFA;VENTOLIN HFA) 108 (90 Base) MCG/ACT inhaler Inhale two puffs every four to six hours as needed for cough or wheeze.   amLODipine (NORVASC) 10 MG tablet Take 0.5 tablets (5 mg total) by mouth daily.   aspirin EC 81 MG tablet Take 81 mg by mouth daily. Swallow whole.   chlorpheniramine (CHLOR-TRIMETON) 4 MG tablet Take 4 mg by mouth 2 (two) times daily as needed for allergies.   clopidogrel (PLAVIX) 75 MG tablet Take 1 tablet by mouth daily.   isosorbide mononitrate (IMDUR) 30 MG 24 hr tablet Take 30 mg by mouth at bedtime.   losartan (COZAAR) 100 MG tablet TAKE 1 TABLET BY MOUTH EVERY DAY   Multiple Vitamins-Minerals (MULTIVITAMIN WITH MINERALS) tablet Take 1 tablet by mouth daily.   nitroGLYCERIN (NITROSTAT) 0.4 MG SL tablet As directed for heart pain   pantoprazole (PROTONIX) 40 MG tablet Take 1 tablet (40 mg total) by mouth 2 (two) times daily before a meal.   rosuvastatin (CRESTOR) 40 MG tablet Take 1 tablet (40 mg total) by mouth at bedtime.   sertraline (ZOLOFT) 100 MG tablet TAKE 1 TABLET BY MOUTH ONCE DAILY (Patient taking differently: Taking 1.5 tablets daily)   Tiotropium Bromide-Olodaterol 2.5-2.5 MCG/ACT AERS Inhale 2 puffs into the lungs daily.    No facility-administered encounter medications on file as of 03/11/2023.    Recent vitals BP Readings from Last 3 Encounters:  02/13/23 128/82  10/08/22 (!) 140/80  08/30/22 110/80   Pulse Readings from Last 3 Encounters:  02/13/23 90  10/08/22 85  07/04/22 72   Wt Readings from Last 3  Encounters:  02/13/23 174 lb (78.9 kg)  10/08/22 188 lb (85.3 kg)  08/30/22 186 lb (84.4 kg)   BMI Readings from Last 3 Encounters:  02/13/23 26.46 kg/m  10/08/22 28.59 kg/m  08/30/22 29.13 kg/m    Recent lab results    Component Value Date/Time   NA 140 02/10/2023 0745   K 4.4 02/10/2023 0745   CL 105 02/10/2023 0745   CO2  19 (L) 02/10/2023 0745   GLUCOSE 77 02/10/2023 0745   BUN 20 02/10/2023 0745   CREATININE 0.87 02/10/2023 0745   CALCIUM 9.9 02/10/2023 0745    Lab Results  Component Value Date   CREATININE 0.87 02/10/2023   EGFR 88 02/10/2023   GFRNONAA 73 01/03/2021   GFRAA 85 01/03/2021   Lab Results  Component Value Date/Time   HGBA1C 5.4 02/10/2023 07:45 AM   HGBA1C 5.5 07/02/2022 10:32 AM    Lab Results  Component Value Date   CHOL 122 02/10/2023   HDL 48 02/10/2023   LDLCALC 56 02/10/2023   TRIG 96 02/10/2023   CHOLHDL 2.5 02/10/2023    Care Gaps: Annual wellness visit in last year? Yes Hep C screening overdue Shingrix overdue AWV overdue  Star Rating Drugs:  Losartan 100 mg- Last filled 02-19-2023 90 DS from [place].  Rosuvastatin 40 mg- Last filled 01-01-2023 90 DS from [place] Pharmacist Assistant [phone]

## 2023-04-24 NOTE — Progress Notes (Signed)
This encounter was created in error - please disregard.

## 2023-04-29 DIAGNOSIS — I739 Peripheral vascular disease, unspecified: Secondary | ICD-10-CM | POA: Diagnosis not present

## 2023-04-29 DIAGNOSIS — Z48812 Encounter for surgical aftercare following surgery on the circulatory system: Secondary | ICD-10-CM | POA: Diagnosis not present

## 2023-05-06 ENCOUNTER — Telehealth: Payer: Self-pay

## 2023-05-06 NOTE — Progress Notes (Signed)
Care Management & Coordination Services Pharmacy Team  Reason for Encounter: Appointment Reminder  Contacted patient to confirm telephone appointment with Artelia Laroche, PharmD on 05/09/23 at 8:00 am.  Spoke with patient on 05/06/2023   Do you have any problems getting your medications? No  What is your top health concern you would like to discuss at your upcoming visit? Pt is having some stomach upset for about 3 weeks. He left a stool samples through the [place] yesterday so he is waiting for those results. Pt is also losing weight in the past 3 weeks. Pt denied any changes in his medications since all this has started.   Have you seen any other providers since your last visit with PCP? Yes, pt sees the [place]   Chart review:  Recent office visits:  None  Recent consult visits:  [facility] No med changes.  04/21/23 ([facility]) No med changes.  04/08/23 ([facility]) No med changes.  Hospital visits:  None   Star Rating Drugs: Gets from [place] Medication:  Last Fill: Day Supply Losartan  02/26/23  90ds  Rosuvastatin   04/08/23 90ds  Care Gaps: Annual wellness visit in last year? No Colonoscopy:None noted (Pt stated he was advised he did not have to have anymore colonoscopy's after age 64) Dexa Scan: None noted If Diabetic:N/A Last eye exam / retinopathy screening: Last diabetic foot exam:   Roxana Hires, [facility] Clinical Pharmacist Assistant  [phone]

## 2023-05-09 ENCOUNTER — Ambulatory Visit: Payer: HMO

## 2023-05-09 NOTE — Patient Outreach (Signed)
Care Management & Coordination Services Pharmacy Note  05/09/2023 Name:  William Clay MRN:  [id number] DOB:  10/06/1941  Summary: -Very pleasant male presents for appt  -Patient states he's very involved in church and volunteers around town. He takes his neighbor for errands -[facility] 4x/week  Recommendations/Changes made from today's visit: -N/A   Subjective: William Clay is an 82 y.o. year old male who is a primary patient of Cox, Kirsten, MD.  The care coordination team was consulted for assistance with disease management and care coordination needs.    Engaged with patient by telephone for follow up visit.  Recent office visits:  None   Recent consult visits:  04/24/23 ([facility]) No med changes.   04/21/23 ([facility]) No med changes.   04/08/23 ([facility]) No med changes.   Hospital visits:  None   Objective:  Lab Results  Component Value Date   CREATININE 0.87 02/10/2023   BUN 20 02/10/2023   EGFR 88 02/10/2023   GFRNONAA 73 01/03/2021   GFRAA 85 01/03/2021   NA 140 02/10/2023   K 4.4 02/10/2023   CALCIUM 9.9 02/10/2023   CO2 19 (L) 02/10/2023   GLUCOSE 77 02/10/2023    Lab Results  Component Value Date/Time   HGBA1C 5.4 02/10/2023 07:45 AM   HGBA1C 5.5 07/02/2022 10:32 AM    Last diabetic Eye exam: No results found for: "HMDIABEYEEXA"  Last diabetic Foot exam: No results found for: "HMDIABFOOTEX"   Lab Results  Component Value Date   CHOL 122 02/10/2023   HDL 48 02/10/2023   LDLCALC 56 02/10/2023   TRIG 96 02/10/2023   CHOLHDL 2.5 02/10/2023       Latest Ref Rng & Units 02/10/2023    7:45 AM 10/04/2022    9:28 AM 08/30/2022   12:32 PM  Hepatic Function  Total Protein 6.0 - 8.5 g/dL 6.8  6.8  6.7   Albumin 3.8 - 4.8 g/dL 4.6  4.5  4.5   AST 0 - 40 IU/L 35  32  37   ALT 0 - 44 IU/L 26  26  28    Alk Phosphatase 44 - 121 IU/L 89  77  78   Total Bilirubin 0.0 - 1.2 mg/dL 0.8  0.7  1.0     Lab  Results  Component Value Date/Time   TSH 1.060 10/30/2021 09:52 AM   TSH 1.340 04/13/2021 09:23 AM       Latest Ref Rng & Units 02/10/2023    7:45 AM 10/04/2022    9:28 AM 08/30/2022   12:32 PM  CBC  WBC 3.4 - 10.8 x10E3/uL 11.9  9.1  9.1   Hemoglobin 13.0 - 17.7 g/dL 40.9  81.1  91.4   Hematocrit 37.5 - 51.0 % 44.4  43.7  42.6   Platelets 150 - 450 x10E3/uL 281  259  251     Lab Results  Component Value Date/Time   VITAMINB12 651 08/30/2022 12:31 PM    Clinical ASCVD: No  The ASCVD Risk score (Arnett DK, et al., 2019) failed to calculate for the following reasons:   The valid total cholesterol range is 130 to 320 mg/dL    Other: ([id number] if Afib, MMRC or CAT for COPD, ACT, DEXA)     11/25/2022   11:06 AM 08/30/2022   11:46 AM 07/04/2022    4:02 PM  Depression screen PHQ 2/9  Decreased Interest 1 0   Down, Depressed, Hopeless 0  0   PHQ - 2 Score 1 0   Difficult doing work/chores   Not difficult at all     Social History   Tobacco Use  Smoking Status Former   Packs/day: 2.00   Years: 50.00   Additional pack years: 0.00   Total pack years: 100.00   Types: Cigarettes   Quit date: 12/24/2007   Years since quitting: 15.3  Smokeless Tobacco Never   BP Readings from Last 3 Encounters:  02/13/23 128/82  10/08/22 (!) 140/80  08/30/22 110/80   Pulse Readings from Last 3 Encounters:  02/13/23 90  10/08/22 85  07/04/22 72   Wt Readings from Last 3 Encounters:  02/13/23 174 lb (78.9 kg)  10/08/22 188 lb (85.3 kg)  08/30/22 186 lb (84.4 kg)   BMI Readings from Last 3 Encounters:  02/13/23 26.46 kg/m  10/08/22 28.59 kg/m  08/30/22 29.13 kg/m    Allergies  Allergen Reactions   Cefadroxil Shortness Of Breath   Lisinopril Other (See Comments)    Dyspnea Dyspnea    Simvastatin Other (See Comments)    Muscle Cramps Muscle Cramps    Amoxicillin     Mouth sores    Gabapentin    Metoprolol Other (See Comments)    Leg cramps   Potassium Clavulanate  [Clavulanic Acid]     diarrhea    Medications Reviewed Today     Reviewed by Horald Pollen, CMA (Certified Medical Assistant) on 02/13/23 at 1404  Med List Status: <None>   Medication Order Taking? Sig Documenting Provider Last Dose Status Informant  albuterol (PROVENTIL HFA;VENTOLIN HFA) 108 (90 Base) MCG/ACT inhaler [id number] No Inhale two puffs every four to six hours as needed for cough or wheeze. Kozlow, Alvira Philips, MD Taking Active   amLODipine (NORVASC) 10 MG tablet [id number] No Take 0.5 tablets (5 mg total) by mouth daily. Blane Ohara, MD Taking Active   aspirin EC 81 MG tablet [id number] No Take 81 mg by mouth daily. Swallow whole. [provider] Taking Active   chlorpheniramine (CHLOR-TRIMETON) 4 MG tablet [id number] No Take 4 mg by mouth 2 (two) times daily as needed for allergies. [provider] Taking Active   clopidogrel (PLAVIX) 75 MG tablet [id number]  Take 1 tablet by mouth daily. [provider]  Active   isosorbide mononitrate (IMDUR) 30 MG 24 hr tablet [id number] No Take 30 mg by mouth at bedtime. [provider] Taking Active   losartan (COZAAR) 100 MG tablet [id number] No TAKE 1 TABLET BY MOUTH EVERY DAY Cox, Kirsten, MD Taking Active   Multiple Vitamins-Minerals (MULTIVITAMIN WITH MINERALS) tablet [id number] No Take 1 tablet by mouth daily. [provider] Taking Active   nitroGLYCERIN (NITROSTAT) 0.4 MG SL tablet [id number] No As directed for heart pain [provider] Taking Active   pantoprazole (PROTONIX) 40 MG tablet [id number]  Take 1 tablet (40 mg total) by mouth 2 (two) times daily before a meal. Cox, Kirsten, MD  Active   rosuvastatin (CRESTOR) 40 MG tablet [id number]  Take 1 tablet (40 mg total) by mouth at bedtime. Cox, Kirsten, MD  Active   sertraline (ZOLOFT) 100 MG tablet [id number] No TAKE 1 TABLET BY MOUTH ONCE DAILY  Patient taking differently: Taking 1.5 tablets daily   Cox, Kirsten, MD Taking Active    Tiotropium Bromide-Olodaterol 2.5-2.5 MCG/ACT AERS [id number] No Inhale 2 puffs into the lungs daily.  [provider] Taking Active             SDOH:  (  Social Determinants of Health) assessments and interventions performed: Yes SDOH Interventions    Flowsheet Row Care Coordination from 05/09/2023 in [facility] Office Visit from 02/13/2023 in [facility] Telephone from 11/25/2022 in [facility] Coordination Clinical Support from 03/07/2022 in [facility] Office Visit from 04/18/2021 in [facility]  SDOH Interventions       Food Insecurity Interventions -- -- Intervention Not Indicated Intervention Not Indicated --  Housing Interventions -- -- -- Intervention Not Indicated --  Transportation Interventions Intervention Not Indicated -- Intervention Not Indicated  [patient normally drives self,  spouse assisting post- recent hospitalization] Intervention Not Indicated --  Utilities Interventions -- Intervention Not Indicated -- -- --  Depression Interventions/Treatment  -- -- -- -- Medication  Financial Strain Interventions Intervention Not Indicated Intervention Not Indicated -- -- --  Physical Activity Interventions -- Intervention Not Indicated -- -- --  Stress Interventions -- Intervention Not Indicated -- -- --  Social Connections Interventions -- Intervention Not Indicated -- -- --       Medication Assistance: None required.  Patient affirms current coverage meets needs.  Medication Access: Name and location of current pharmacy:  [facility] [address] - [place], [place] - [address] [place] [address] Phone: [phone] Fax: [phone]  [facility] - [place], [place] - [address] [place] [address] Phone: [phone] Fax: [phone]   Compliance/Adherence/Medication fill history:  Star Rating Drugs: Gets from  [place] Medication:                Last Fill:         Day Supply Losartan                      02/26/23              90ds     Rosuvastatin               04/08/23            90ds   Care Gaps: Annual wellness visit in last year? No Colonoscopy:None noted (Pt stated he was advised he did not have to have anymore colonoscopy's after age 48) Dexa Scan: None noted   Assessment/Plan   Hypertension (BP goal <140/90) BP Readings from Last 3 Encounters:  02/13/23 128/82  10/08/22 (!) 140/80  08/30/22 110/80    Pulse Readings from Last 3 Encounters:  02/13/23 90  10/08/22 85  07/04/22 72   -Controlled -Current treatment: Amlodipine 5mg  Appropriate, Effective, Safe, Accessible Losartan 100mg  Appropriate, Effective, Safe, Accessible -Medications previously tried: N/A  -Current home readings:  May 2024: 120/78 -Current dietary habits: "Tries to eat healthy" -Current exercise habits: None -Denies hypotensive/hypertensive symptoms -Educated on BP goals and benefits of medications for prevention of heart attack, stroke and kidney damage; -Counseled to monitor BP at home weekly, document, and provide log at future appointments -Recommended to continue current medication  CAD: (LDL goal < 70) The ASCVD Risk score (Arnett DK, et al., 2019) failed to calculate for the following reasons:   The valid total cholesterol range is 130 to 320 mg/dL Lab Results  Component Value Date   CHOL 122 02/10/2023   CHOL 124 10/04/2022   CHOL 161 07/02/2022   Lab Results  Component Value Date   HDL 48 02/10/2023   HDL 43 10/04/2022   HDL 42 07/02/2022   Lab Results  Component Value Date   LDLCALC 56 02/10/2023   LDLCALC 54 10/04/2022   LDLCALC 90 07/02/2022   Lab Results  Component Value Date   TRIG 96 02/10/2023   TRIG 156 (H) 10/04/2022   TRIG 170 (H) 07/02/2022   Lab Results  Component Value Date   CHOLHDL 2.5 02/10/2023   CHOLHDL 2.9 10/04/2022   CHOLHDL 3.8 07/02/2022   No results  found for: "LDLDIRECT" Last vitamin D No results found for: "25OHVITD2", "25OHVITD3", "VD25OH" Lab Results  Component Value Date   TSH 1.060 10/30/2021   -Controlled -Current treatment: ASA 81mg  Appropriate, Effective, Safe, Accessible Rosuvastatin 40mg  Appropriate, Effective, Safe, Accessible -Medications previously tried: N/A  -Current dietary patterns: "Tries to eat healthy" -Current exercise habits: None -Educated on Cholesterol goals;  -Recommended to continue current medication  Depression/Anxiety (Goal: PHQ9<5) -Controlled -Current treatment: Sertraline 100mg  Appropriate, Effective, Safe, Accessible -Medications previously tried/failed: N/A -PHQ9:     11/25/2022   11:06 AM 08/30/2022   11:46 AM 07/04/2022    4:02 PM  Depression screen PHQ 2/9  Decreased Interest 1 0   Down, Depressed, Hopeless 0 0   PHQ - 2 Score 1 0   Difficult doing work/chores   Not difficult at all  -GAD7:      No data to display         -Educated on Benefits of medication for symptom control -Recommended to continue current medication  CP F/U PRN  Artelia Laroche, Pharm.D. - [phone]

## 2023-05-18 ENCOUNTER — Other Ambulatory Visit: Payer: Self-pay

## 2023-05-18 DIAGNOSIS — E782 Mixed hyperlipidemia: Secondary | ICD-10-CM

## 2023-05-18 DIAGNOSIS — I10 Essential (primary) hypertension: Secondary | ICD-10-CM

## 2023-05-18 DIAGNOSIS — R7302 Impaired glucose tolerance (oral): Secondary | ICD-10-CM

## 2023-05-20 ENCOUNTER — Other Ambulatory Visit: Payer: HMO

## 2023-05-20 DIAGNOSIS — R7302 Impaired glucose tolerance (oral): Secondary | ICD-10-CM | POA: Diagnosis not present

## 2023-05-20 DIAGNOSIS — I10 Essential (primary) hypertension: Secondary | ICD-10-CM

## 2023-05-20 DIAGNOSIS — E782 Mixed hyperlipidemia: Secondary | ICD-10-CM

## 2023-05-21 LAB — CBC WITH DIFFERENTIAL/PLATELET
Basophils Absolute: 0.1 10*3/uL (ref 0.0–0.2)
Basos: 1 %
EOS (ABSOLUTE): 0.2 10*3/uL (ref 0.0–0.4)
Eos: 2 %
Hematocrit: 42.6 % (ref 37.5–51.0)
Hemoglobin: 13.9 g/dL (ref 13.0–17.7)
Immature Grans (Abs): 0 10*3/uL (ref 0.0–0.1)
Immature Granulocytes: 0 %
Lymphocytes Absolute: 1 10*3/uL (ref 0.7–3.1)
Lymphs: 9 %
MCH: 31.7 pg (ref 26.6–33.0)
MCHC: 32.6 g/dL (ref 31.5–35.7)
MCV: 97 fL (ref 79–97)
Monocytes Absolute: 1.1 10*3/uL — ABNORMAL HIGH (ref 0.1–0.9)
Monocytes: 10 %
Neutrophils Absolute: 8.1 10*3/uL — ABNORMAL HIGH (ref 1.4–7.0)
Neutrophils: 78 %
Platelets: 280 10*3/uL (ref 150–450)
RBC: 4.39 x10E6/uL (ref 4.14–5.80)
RDW: 14.6 % (ref 11.6–15.4)
WBC: 10.5 10*3/uL (ref 3.4–10.8)

## 2023-05-21 LAB — COMPREHENSIVE METABOLIC PANEL
ALT: 22 IU/L (ref 0–44)
AST: 24 IU/L (ref 0–40)
Albumin/Globulin Ratio: 2.2 (ref 1.2–2.2)
Albumin: 4.3 g/dL (ref 3.8–4.8)
Alkaline Phosphatase: 97 IU/L (ref 44–121)
BUN/Creatinine Ratio: 17 (ref 10–24)
BUN: 14 mg/dL (ref 8–27)
Bilirubin Total: 0.7 mg/dL (ref 0.0–1.2)
CO2: 19 mmol/L — ABNORMAL LOW (ref 20–29)
Calcium: 9.9 mg/dL (ref 8.6–10.2)
Chloride: 107 mmol/L — ABNORMAL HIGH (ref 96–106)
Creatinine, Ser: 0.82 mg/dL (ref 0.76–1.27)
Globulin, Total: 2 g/dL (ref 1.5–4.5)
Glucose: 88 mg/dL (ref 70–99)
Potassium: 4.3 mmol/L (ref 3.5–5.2)
Sodium: 140 mmol/L (ref 134–144)
Total Protein: 6.3 g/dL (ref 6.0–8.5)
eGFR: 89 mL/min/{1.73_m2} (ref >59)

## 2023-05-21 LAB — HEMOGLOBIN A1C
Est. average glucose Bld gHb Est-mCnc: 117 mg/dL
Hgb A1c MFr Bld: 5.7 % — ABNORMAL HIGH (ref 4.8–5.6)

## 2023-05-21 LAB — LIPID PANEL
Chol/HDL Ratio: 2.6 ratio (ref 0.0–5.0)
Cholesterol, Total: 127 mg/dL (ref 100–199)
HDL: 48 mg/dL (ref >39)
LDL Chol Calc (NIH): 60 mg/dL (ref 0–99)
Triglycerides: 100 mg/dL (ref 0–149)
VLDL Cholesterol Cal: 19 mg/dL (ref 5–40)

## 2023-05-21 LAB — CARDIOVASCULAR RISK ASSESSMENT

## 2023-05-22 ENCOUNTER — Encounter: Payer: Self-pay | Admitting: Family Medicine

## 2023-05-22 ENCOUNTER — Ambulatory Visit (INDEPENDENT_AMBULATORY_CARE_PROVIDER_SITE_OTHER): Payer: HMO | Admitting: Family Medicine

## 2023-05-22 VITALS — BP 124/80 | HR 70 | Temp 96.7°F | Ht 68.0 in | Wt 173.0 lb

## 2023-05-22 DIAGNOSIS — I10 Essential (primary) hypertension: Secondary | ICD-10-CM | POA: Diagnosis not present

## 2023-05-22 DIAGNOSIS — R296 Repeated falls: Secondary | ICD-10-CM

## 2023-05-22 DIAGNOSIS — K219 Gastro-esophageal reflux disease without esophagitis: Secondary | ICD-10-CM | POA: Diagnosis not present

## 2023-05-22 DIAGNOSIS — F330 Major depressive disorder, recurrent, mild: Secondary | ICD-10-CM | POA: Diagnosis not present

## 2023-05-22 DIAGNOSIS — R2689 Other abnormalities of gait and mobility: Secondary | ICD-10-CM | POA: Diagnosis not present

## 2023-05-22 DIAGNOSIS — Z9981 Dependence on supplemental oxygen: Secondary | ICD-10-CM | POA: Diagnosis not present

## 2023-05-22 DIAGNOSIS — I25118 Atherosclerotic heart disease of native coronary artery with other forms of angina pectoris: Secondary | ICD-10-CM | POA: Diagnosis not present

## 2023-05-22 DIAGNOSIS — J9611 Chronic respiratory failure with hypoxia: Secondary | ICD-10-CM

## 2023-05-22 DIAGNOSIS — E782 Mixed hyperlipidemia: Secondary | ICD-10-CM

## 2023-05-22 DIAGNOSIS — G4733 Obstructive sleep apnea (adult) (pediatric): Secondary | ICD-10-CM

## 2023-05-22 NOTE — Assessment & Plan Note (Addendum)
Well controlled.  No medication changes recommended. Losartan 100 mg daily, Norvasc 10 mg daily, Asa 81  Continue healthy diet and exercise.

## 2023-05-22 NOTE — Assessment & Plan Note (Signed)
Continue oxygen. 

## 2023-05-22 NOTE — Progress Notes (Addendum)
Subjective:  Patient ID: William Clay, male    DOB: 10/09/1941  Age: 82 y.o. MRN: [id number]  Chief Complaint  Patient presents with   Medical Management of Chronic Issues    HPI  HTN: Norvasc 10 mg daily, Asa 81 and Plavix, Losartan 100 mg daily   Hyperlipidemia: Crestor 40 mg daily   GERD: Protonix 40 mg daily   Anxiety/Depression: Sertraline150 mg daily.   Prediabetes: A1c 5.7  OSA with cpap and O2 3L  Chronic respiratory failure with hypoxia: uses 3 L oxygen almost 24 hours per day.   [facility]     05/22/2023   10:47 AM 11/25/2022   11:06 AM 08/30/2022   11:46 AM 07/04/2022    4:02 PM 07/04/2022    3:40 PM  Depression screen PHQ 2/9  Decreased Interest 1 1 0  0  Down, Depressed, Hopeless 0 0 0  0  PHQ - 2 Score 1 1 0  0  Altered sleeping 0    0  Tired, decreased energy 0    0  Change in appetite 0    0  Feeling bad or failure about yourself  0    0  Trouble concentrating 0    0  Moving slowly or fidgety/restless 0    0  Suicidal thoughts 0    0  PHQ-9 Score 1    0  Difficult doing work/chores Not difficult at all   Not difficult at all         05/22/2023   10:47 AM  Fall Risk   Falls in the past year? 1  Number falls in past yr: 1  Injury with Fall? 0  Risk for fall due to : Impaired balance/gait  Follow up Falls evaluation completed    Patient Care Team: Blane Ohara, MD as PCP - General (Family Medicine) Adelfa Koh, MD as Referring Physician (Orthopedic Surgery) Zettie Pho, [facility] (Pharmacist)   Review of Systems  Constitutional:  Negative for chills, diaphoresis, fatigue and fever.  HENT:  Negative for congestion, ear pain and sore throat.   Respiratory:  Negative for cough and shortness of breath.   Cardiovascular:  Negative for chest pain and leg swelling.  Gastrointestinal:  Positive for diarrhea. Negative for abdominal pain, constipation, nausea and vomiting.  Genitourinary:  Negative for dysuria and urgency.   Musculoskeletal:  Negative for arthralgias and myalgias.  Neurological:  Negative for dizziness and headaches.  Psychiatric/Behavioral:  Negative for dysphoric mood.     Current Outpatient Medications on File Prior to Visit  Medication Sig Dispense Refill   albuterol (PROVENTIL HFA;VENTOLIN HFA) 108 (90 Base) MCG/ACT inhaler Inhale two puffs every four to six hours as needed for cough or wheeze. 1 Inhaler 1   amLODipine (NORVASC) 10 MG tablet Take 0.5 tablets (5 mg total) by mouth daily. 1 tablet 0   aspirin EC 81 MG tablet Take 81 mg by mouth daily. Swallow whole.     chlorpheniramine (CHLOR-TRIMETON) 4 MG tablet Take 4 mg by mouth 2 (two) times daily as needed for allergies.     clopidogrel (PLAVIX) 75 MG tablet Take 1 tablet by mouth daily.     isosorbide mononitrate (IMDUR) 30 MG 24 hr tablet Take 30 mg by mouth at bedtime.     losartan (COZAAR) 100 MG tablet TAKE 1 TABLET BY MOUTH EVERY DAY 90 tablet 0   Multiple Vitamins-Minerals (MULTIVITAMIN WITH MINERALS) tablet Take 1 tablet by mouth daily.     nitroGLYCERIN (  NITROSTAT) 0.4 MG SL tablet As directed for heart pain     pantoprazole (PROTONIX) 40 MG tablet Take 1 tablet (40 mg total) by mouth 2 (two) times daily before a meal. 180 tablet 3   rosuvastatin (CRESTOR) 40 MG tablet Take 1 tablet (40 mg total) by mouth at bedtime. 90 tablet 1   sertraline (ZOLOFT) 100 MG tablet TAKE 1 TABLET BY MOUTH ONCE DAILY (Patient taking differently: Taking 1.5 tablets daily) 90 tablet 3   Tiotropium Bromide-Olodaterol 2.5-2.5 MCG/ACT AERS Inhale 2 puffs into the lungs daily.      No current facility-administered medications on file prior to visit.   Past Medical History:  Diagnosis Date   Atherosclerosis of aorta (HCC)    Bowel obstruction (HCC) 2018   BPH (benign prostatic hyperplasia)    COPD (chronic obstructive pulmonary disease) (HCC)    COPD (chronic obstructive pulmonary disease) (HCC)    GERD (gastroesophageal reflux disease)     Hypertension    Hypoxemia    Lumbar stenosis with neurogenic claudication 04/24/2017   Mixed hyperlipidemia    Osteoarthritis    Pneumonia 2016,2017   Primary insomnia    Sleep apnea    Spontaneous ecchymoses    Past Surgical History:  Procedure Laterality Date   ABDOMINAL AORTIC ANEURYSM REPAIR     BACK SURGERY N/A [phone], 2021   Lower back and cervical spine   BACK SURGERY  1984   upper back   CATARACT EXTRACTION Left 2017   CATARACT EXTRACTION Right 2017   CHOLECYSTECTOMY  2018   HERNIA REPAIR  2018   PROSTATECTOMY     ROTATOR CUFF REPAIR Right 2015   ROTATOR CUFF REPAIR Left 2017    Family History  Problem Relation Age of Onset   Heart failure Mother    Diabetes Mother    CAD Mother    Hypertension Mother    Social History   Socioeconomic History   Marital status: Married    Spouse name: Bonita Quin   Number of children: 2   Years of education: Not on file   Highest education level: Not on file  Occupational History   Occupation: retired    Comment: Patent examiner   Occupation: Retired Pharmacologist  Tobacco Use   Smoking status: Former    Packs/day: 2.00    Years: 50.00    Additional pack years: 0.00    Total pack years: 100.00    Types: Cigarettes    Quit date: 12/24/2007    Years since quitting: 15.4   Smokeless tobacco: Never  Vaping Use   Vaping Use: Never used  Substance and Sexual Activity   Alcohol use: No    Alcohol/week: 0.0 standard drinks of alcohol   Drug use: No   Sexual activity: Not Currently  Other Topics Concern   Not on file  Social History Narrative   Not on file   Social Determinants of Health   Financial Resource Strain: Low Risk  (05/09/2023)   Overall Financial Resource Strain (CARDIA)    Difficulty of Paying Living Expenses: Not hard at all  Food Insecurity: No Food Insecurity (11/25/2022)   Hunger Vital Sign    Worried About Running Out of Food in the Last Year: Never true    Ran Out of Food  in the Last Year: Never true  Transportation Needs: No Transportation Needs (05/09/2023)   PRAPARE - Administrator, Civil Service (Medical): No    Lack of Transportation (Non-Medical): No  Physical  Activity: Insufficiently Active (05/22/2023)   Exercise Vital Sign    Days of Exercise per Week: 3 days    Minutes of Exercise per Session: 30 min  Stress: No Stress Concern Present (02/13/2023)   [employer] of Occupational Health - Occupational Stress Questionnaire    Feeling of Stress : Not at all  Social Connections: Moderately Isolated (02/13/2023)   Social Connection and Isolation Panel [NHANES]    Frequency of Communication with Friends and Family: Three times a week    Frequency of Social Gatherings with Friends and Family: Three times a week    Attends Religious Services: More than 4 times per year    Active Member of Clubs or Organizations: No    Attends Banker Meetings: Never    Marital Status: Widowed    Objective:  BP 124/80   Pulse 70   Temp (!) 96.7 F (35.9 C)   Ht 5\' 8"  (1.727 m)   Wt 173 lb (78.5 kg)   SpO2 93%   BMI 26.30 kg/m      05/22/2023   10:43 AM 02/13/2023    1:29 PM 10/08/2022    2:49 PM  BP/Weight  Systolic BP 124 128 140  Diastolic BP 80 82 80  Wt. (Lbs) 173 174   BMI 26.3 kg/m2 26.46 kg/m2     Physical Exam Vitals reviewed.  Constitutional:      Appearance: Normal appearance. He is normal weight.  Cardiovascular:     Rate and Rhythm: Normal rate and regular rhythm.     Heart sounds: No murmur heard. Pulmonary:     Effort: Pulmonary effort is normal.     Breath sounds: Normal breath sounds.  Abdominal:     General: Abdomen is flat. Bowel sounds are normal.     Palpations: Abdomen is soft.     Tenderness: There is no abdominal tenderness.     Comments: ABNORMAL  Feet:     Comments: Pulses are good Neurological:     Mental Status: He is alert and oriented to person, place, and time.  Psychiatric:         Mood and Affect: Mood normal.        Behavior: Behavior normal.     Diabetic Foot Exam - Simple   No data filed      Lab Results  Component Value Date   WBC 10.5 05/20/2023   HGB 13.9 05/20/2023   HCT 42.6 05/20/2023   PLT 280 05/20/2023   GLUCOSE 88 05/20/2023   CHOL 127 05/20/2023   TRIG 100 05/20/2023   HDL 48 05/20/2023   LDLCALC 60 05/20/2023   ALT 22 05/20/2023   AST 24 05/20/2023   NA 140 05/20/2023   K 4.3 05/20/2023   CL 107 (H) 05/20/2023   CREATININE 0.82 05/20/2023   BUN 14 05/20/2023   CO2 19 (L) 05/20/2023   TSH 1.060 10/30/2021   HGBA1C 5.7 (H) 05/20/2023      Assessment & Plan:    Mixed hyperlipidemia Assessment & Plan: Well controlled.  No changes to medicines. Continue crestor 40 mg once daily. Continue to work on eating a healthy diet and exercise.  Labs reviewed    Essential hypertension, benign Assessment & Plan: Well controlled.  No medication changes recommended. Losartan 100 mg daily, Norvasc 10 mg daily, Asa 81  Continue healthy diet and exercise.     Imbalance Assessment & Plan: Continue using roller walker.   Frequent falls Assessment & Plan: Continue using roller  walker.   Coronary artery disease of native artery of native heart with stable angina pectoris [facility]) Assessment & Plan: The current medical regimen is effective;  continue present plan and medications.  Crestor, ASA, Plavix, Imdur.    Gastroesophageal reflux disease without esophagitis Assessment & Plan: Continue current medication   Mild recurrent major depression (HCC) Assessment & Plan: Continue zoloft to 150 mg daily. [facility]   Oxygen dependent Assessment & Plan: Continue oxygen 2 L at night.    Chronic respiratory failure with hypoxia (HCC) Assessment & Plan: Continue oxygen   OSA on CPAP Assessment & Plan: Continue to wear cpap with oxygen.      No orders of the defined types were placed in this  encounter.   No orders of the defined types were placed in this encounter.    Follow-up: Return in about 3 months (around 08/22/2023) for chronic, fasting.   I,Katherina A Bramblett,acting as a scribe for Blane Ohara, MD.,have documented all relevant documentation on the behalf of Blane Ohara, MD,as directed by  Blane Ohara, MD while in the presence of Blane Ohara, MD.   Clayborn Bigness I Leal-Borjas,acting as a scribe for Blane Ohara, MD.,have documented all relevant documentation on the behalf of Blane Ohara, MD,as directed by  Blane Ohara, MD while in the presence of Blane Ohara, MD.    An After Visit Summary was printed and given to the patient. I attest that I have reviewed this visit and agree with the plan scribed by my staff.   Blane Ohara, MD [facility] [phone]

## 2023-05-22 NOTE — Assessment & Plan Note (Signed)
Continue zoloft to 150 mg daily. [facility].

## 2023-05-22 NOTE — Assessment & Plan Note (Signed)
Continue current medication.

## 2023-05-23 NOTE — Assessment & Plan Note (Addendum)
Continue using roller walker.

## 2023-05-23 NOTE — Assessment & Plan Note (Signed)
Continue to wear cpap with oxygen. 

## 2023-05-23 NOTE — Assessment & Plan Note (Signed)
Well controlled.  No changes to medicines. Continue crestor 40 mg once daily. Continue to work on eating a healthy diet and exercise.  Labs reviewed  

## 2023-05-23 NOTE — Assessment & Plan Note (Signed)
Continue oxygen 2 L at night.  

## 2023-05-23 NOTE — Assessment & Plan Note (Signed)
The current medical regimen is effective;  continue present plan and medications.  Crestor, ASA, Plavix, Imdur.

## 2023-05-23 NOTE — Assessment & Plan Note (Signed)
Continue using roller walker. 

## 2023-05-28 DIAGNOSIS — S20211A Contusion of right front wall of thorax, initial encounter: Secondary | ICD-10-CM | POA: Diagnosis not present

## 2023-05-28 DIAGNOSIS — I10 Essential (primary) hypertension: Secondary | ICD-10-CM | POA: Diagnosis not present

## 2023-05-28 DIAGNOSIS — X58XXXA Exposure to other specified factors, initial encounter: Secondary | ICD-10-CM | POA: Diagnosis not present

## 2023-05-28 DIAGNOSIS — J449 Chronic obstructive pulmonary disease, unspecified: Secondary | ICD-10-CM | POA: Diagnosis not present

## 2023-06-11 ENCOUNTER — Ambulatory Visit (INDEPENDENT_AMBULATORY_CARE_PROVIDER_SITE_OTHER): Payer: HMO

## 2023-06-11 VITALS — BP 110/60 | HR 77 | Resp 16 | Ht 68.0 in | Wt 174.0 lb

## 2023-06-11 DIAGNOSIS — S2341XD Sprain of ribs, subsequent encounter: Secondary | ICD-10-CM | POA: Insufficient documentation

## 2023-06-11 DIAGNOSIS — Z0000 Encounter for general adult medical examination without abnormal findings: Secondary | ICD-10-CM

## 2023-06-11 NOTE — Progress Notes (Signed)
Subjective:   William Clay is a 82 y.o. male who presents for Medicare Annual/Subsequent preventive examination.  Visit Complete: In person This wellness visit is conducted by a nurse.  The patient's medications were reviewed and reconciled since the patient's last visit.  History details were provided by the patient.  The history appears to be reliable.    Medical History: Patient history and Family history was reviewed  Medications, Allergies, and preventative health maintenance was reviewed and updated.  Cardiac Risk Factors include: advanced age (>34men, >72 women);male gender     Objective:    Today's Vitals   06/11/23 1450  BP: 110/60  Pulse: 77  Resp: 16  SpO2: 90%  Weight: 174 lb (78.9 kg)  Height: 5\' 8"  (1.727 m)  PainSc: 0-No pain   Body mass index is 26.46 kg/m.     11/30/2020    2:36 PM  Advanced Directives  Does Patient Have a Medical Advance Directive? Yes  Type of Advance Directive Healthcare Power of Attorney  Copy of Healthcare Power of Attorney in Chart? No - copy requested    Current Medications (verified) Outpatient Encounter Medications as of 06/11/2023  Medication Sig   albuterol (PROVENTIL HFA;VENTOLIN HFA) 108 (90 Base) MCG/ACT inhaler Inhale two puffs every four to six hours as needed for cough or wheeze.   amLODipine (NORVASC) 10 MG tablet Take 0.5 tablets (5 mg total) by mouth daily.   aspirin EC 81 MG tablet Take 81 mg by mouth daily. Swallow whole.   chlorpheniramine (CHLOR-TRIMETON) 4 MG tablet Take 4 mg by mouth 2 (two) times daily as needed for allergies.   clopidogrel (PLAVIX) 75 MG tablet Take 1 tablet by mouth daily.   isosorbide mononitrate (IMDUR) 30 MG 24 hr tablet Take 30 mg by mouth at bedtime.   losartan (COZAAR) 100 MG tablet TAKE 1 TABLET BY MOUTH EVERY DAY   Multiple Vitamins-Minerals (MULTIVITAMIN WITH MINERALS) tablet Take 1 tablet by mouth daily.   nitroGLYCERIN (NITROSTAT) 0.4 MG SL tablet As directed for heart pain    pantoprazole (PROTONIX) 40 MG tablet Take 1 tablet (40 mg total) by mouth 2 (two) times daily before a meal.   rosuvastatin (CRESTOR) 40 MG tablet Take 1 tablet (40 mg total) by mouth at bedtime.   sertraline (ZOLOFT) 100 MG tablet TAKE 1 TABLET BY MOUTH ONCE DAILY (Patient taking differently: Taking 1.5 tablets daily)   Tiotropium Bromide-Olodaterol 2.5-2.5 MCG/ACT AERS Inhale 2 puffs into the lungs daily.    No facility-administered encounter medications on file as of 06/11/2023.    Allergies (verified) Cefadroxil, Lisinopril, Simvastatin, Amoxicillin, Gabapentin, Metoprolol, and Potassium clavulanate [clavulanic acid]   History: Past Medical History:  Diagnosis Date   Atherosclerosis of aorta (HCC)    Bowel obstruction (HCC) 2018   BPH (benign prostatic hyperplasia)    COPD (chronic obstructive pulmonary disease) (HCC)    COPD (chronic obstructive pulmonary disease) (HCC)    GERD (gastroesophageal reflux disease)    Hypertension    Hypoxemia    Lumbar stenosis with neurogenic claudication 04/24/2017   Mixed hyperlipidemia    Osteoarthritis    Pneumonia 2016,2017   Primary insomnia    Sleep apnea    Spontaneous ecchymoses    Past Surgical History:  Procedure Laterality Date   ABDOMINAL AORTIC ANEURYSM REPAIR     BACK SURGERY N/A [phone], 2021   Lower back and cervical spine   BACK SURGERY  1984   upper back   CATARACT EXTRACTION Left 2017  CATARACT EXTRACTION Right 2017   CHOLECYSTECTOMY  2018   HERNIA REPAIR  2018   PROSTATECTOMY     ROTATOR CUFF REPAIR Right 2015   ROTATOR CUFF REPAIR Left 2017   Family History  Problem Relation Age of Onset   Heart failure Mother    Diabetes Mother    CAD Mother    Hypertension Mother    Social History   Socioeconomic History   Marital status: Widowed    Spouse name: Bonita Quin   Number of children: 2   Years of education: Not on file   Highest education level: Not on file  Occupational History    Occupation: retired    Comment: Patent examiner   Occupation: Retired Pharmacologist  Tobacco Use   Smoking status: Former    Packs/day: 2.00    Years: 50.00    Additional pack years: 0.00    Total pack years: 100.00    Types: Cigarettes    Quit date: 12/24/2007    Years since quitting: 15.4   Smokeless tobacco: Never  Vaping Use   Vaping Use: Never used  Substance and Sexual Activity   Alcohol use: Never   Drug use: Never   Sexual activity: Not Currently  Other Topics Concern   Not on file  Social History Narrative   Not on file   Social Determinants of Health   Financial Resource Strain: Low Risk  (06/11/2023)   Overall Financial Resource Strain (CARDIA)    Difficulty of Paying Living Expenses: Not hard at all  Food Insecurity: No Food Insecurity (06/11/2023)   Hunger Vital Sign    Worried About Running Out of Food in the Last Year: Never true    Ran Out of Food in the Last Year: Never true  Transportation Needs: No Transportation Needs (06/11/2023)   PRAPARE - Administrator, Civil Service (Medical): No    Lack of Transportation (Non-Medical): No  Physical Activity: Insufficiently Active (06/11/2023)   Exercise Vital Sign    Days of Exercise per Week: 3 days    Minutes of Exercise per Session: 30 min  Stress: No Stress Concern Present (06/11/2023)   [employer] of Occupational Health - Occupational Stress Questionnaire    Feeling of Stress : Not at all  Social Connections: Moderately Isolated (06/11/2023)   Social Connection and Isolation Panel [NHANES]    Frequency of Communication with Friends and Family: Three times a week    Frequency of Social Gatherings with Friends and Family: Three times a week    Attends Religious Services: More than 4 times per year    Active Member of Clubs or Organizations: No    Attends Banker Meetings: Never    Marital Status: Widowed    Tobacco Counseling Counseling given: Not Answered   Clinical  Intake:  Pre-visit preparation completed: Yes  Pain : No/denies pain Pain Score: 0-No pain     BMI - recorded: 26.46 Nutritional Status: BMI 25 -29 Overweight Nutritional Risks: None Diabetes: No  How often do you need to have someone help you when you read instructions, pamphlets, or other written materials from your doctor or pharmacy?: 2 - Rarely  Interpreter Needed?: No      Activities of Daily Living    06/11/2023    3:46 PM 02/13/2023    1:43 PM  In your present state of health, do you have any difficulty performing the following activities:  Hearing? 1 0  Comment uses hearing aids  Vision? 0 0  Difficulty concentrating or making decisions? 0 0  Walking or climbing stairs? 1 0  Dressing or bathing? 0 0  Doing errands, shopping? 0 0  Preparing Food and eating ? N   Using the Toilet? N   In the past six months, have you accidently leaked urine? N   Do you have problems with loss of bowel control? N   Managing your Medications? N   Managing your Finances? N   Housekeeping or managing your Housekeeping? N     Patient Care Team: Blane Ohara, MD as PCP - General (Family Medicine) Adelfa Koh, MD as Referring Physician (Orthopedic Surgery) Zettie Pho, [facility] (Inactive) (Pharmacist)  Indicate any recent Medical Services you may have received from other than [facility] providers in the past year (date may be approximate).     Assessment:   This is a routine wellness examination for Jamaury.  Hearing/Vision screen No results found.  Dietary issues and exercise activities discussed:     Goals Addressed             This Visit's Progress    Prevent falls       1- Wear properly fitting, sturdy, flat shoes with nonskid soles.  2- Take a look around your home for potential fall hazards. To make your home safer: -Remove boxes, newspapers, electrical cords and phone cords from walkways. -Move coffee tables, magazine racks and plant stands from  high-traffic areas. -Secure loose rugs with double-faced tape, tacks or a slip-resistant backing -- or remove loose rugs from your home. -Repair loose, wooden floorboards and carpeting right away. -Store [employer] fast streamer, dishes, food and other necessities within easy reach. -Immediately clean spilled liquids, grease or food. -Use nonslip mats in your bathtub or shower. Use a bath seat, which allows you to sit while showering.  3- Keep your home brightly lit to avoid tripping on objects that are hard to see        Depression Screen    06/11/2023    3:43 PM 05/22/2023   10:47 AM 11/25/2022   11:06 AM 08/30/2022   11:46 AM 07/04/2022    3:40 PM 03/11/2022   10:37 AM 10/30/2021    9:03 AM  PHQ 2/9 Scores  PHQ - 2 Score 1 1 1  0 0 0 0  PHQ- 9 Score 1 1   0      Fall Risk    06/11/2023    3:45 PM 05/22/2023   10:47 AM 02/13/2023    1:42 PM 10/08/2022    2:05 PM 08/30/2022   11:46 AM  Fall Risk   Falls in the past year? 1 1 1 1 1   Number falls in past yr: 1 1 1 1 1   Injury with Fall? 0 0 0 0 1  Risk for fall due to : History of fall(s);Impaired balance/gait Impaired balance/gait Impaired balance/gait;History of fall(s) History of fall(s) History of fall(s);Impaired balance/gait  Follow up Falls evaluation completed;Education provided Falls evaluation completed Falls evaluation completed  Falls evaluation completed    MEDICARE RISK AT HOME:  Medicare Risk at Home - 06/11/23 1455     Any stairs in or around the home? Yes   Has a ramp   If so, are there any without handrails? No    Home free of loose throw rugs in walkways, pet beds, electrical cords, etc? Yes    Adequate lighting in your home to reduce risk of falls? Yes    Life alert? No  Use of a cane, walker or w/c? Yes    Grab bars in the bathroom? Yes    Shower chair or bench in shower? Yes    Elevated toilet seat or a handicapped toilet? No             TIMED UP AND GO:  Was the test performed?  Yes  Length of time to  ambulate 10 feet: 8 sec Gait slow and steady with assistive device    Cognitive Function:        06/11/2023    3:47 PM 03/11/2022   10:39 AM 11/30/2020    2:40 PM  6CIT Screen  What Year? 0 points 0 points 0 points  What month? 0 points 0 points 0 points  What time? 0 points 0 points 0 points  Count back from 20 0 points 0 points 4 points  Months in reverse 2 points 2 points 0 points  Repeat phrase 0 points 0 points 2 points  Total Score 2 points 2 points 6 points    Immunizations Immunization History  Administered Date(s) Administered   COVID-19, mRNA, vaccine(Comirnaty)12 years and older 12/26/2022, 06/04/2023   Fluad Quad(high Dose 65+) 09/12/2020, 10/30/2021   Influenza Split 09/23/2007, 10/26/2008, 10/26/2009, 01/16/2011, 10/21/2011, 08/27/2012, 10/23/2013, 09/14/2014, 08/24/2015, 09/12/2020   Influenza, High Dose Seasonal PF 08/27/2012, 09/14/2014   Influenza-Unspecified 08/27/2012, 09/14/2014, 08/24/2015, 09/12/2020, 07/24/2021, 12/26/2022   Moderna Covid-19 Vaccine Bivalent Booster 58yrs & up 04/18/2021   Moderna SARS-COV2 Booster Vaccination 04/18/2021   Moderna Sars-Covid-2 Vaccination 02/21/2020, 03/12/2020, 03/20/2021, 07/24/2021   Pfizer Covid-19 Vaccine Bivalent Booster 38yrs & up 01/01/2022   Pneumococcal Conjugate-13 08/30/2014, 10/23/2014   Pneumococcal Polysaccharide-23 10/04/2020, 07/22/2022   Pneumococcal-Unspecified 02/08/2009   Rsv, Bivalent, Protein Subunit Rsvpref,pf Verdis Frederickson) 06/04/2023   Tdap 03/30/2012, 01/01/2022   Zoster Recombinat (Shingrix) 07/22/2022, 06/04/2023   Zoster, Live 11/29/2013    TDAP status: Up to date  Flu Vaccine status: Up to date  Pneumococcal vaccine status: Up to date  Covid-19 vaccine status: Completed vaccines  Qualifies for Shingles Vaccine? Yes   Zostavax completed Yes   Shingrix Completed?: Yes  Screening Tests Health Maintenance  Topic Date Due   Hepatitis C Screening  Never done   Medicare Annual Wellness  (AWV)  03/08/2023   INFLUENZA VACCINE  07/24/2023   COVID-19 Vaccine (9 - 2023-24 season) 07/30/2023   DTaP/Tdap/Td (3 - Td or Tdap) 01/02/2032   Pneumonia Vaccine 74+ Years old  Completed   Zoster Vaccines- Shingrix  Completed   HPV VACCINES  Aged Out   Lung Cancer Screening  Discontinued   Colonoscopy  Discontinued    Health Maintenance  Health Maintenance Due  Topic Date Due   Hepatitis C Screening  Never done   Medicare Annual Wellness (AWV)  03/08/2023    Colorectal cancer screening: No longer required.   Lung Cancer Screening: (Low Dose CT Chest recommended if Age 78-80 years, 20 pack-year currently smoking OR have quit w/in 15years.) does not qualify.   Additional Screening:  Vision Screening: Recommended annual ophthalmology exams for early detection of glaucoma and other disorders of the eye. Is the patient up to date with their annual eye exam?  Yes  Who is the provider or what [facility] the [facility]  Dental Screening: Recommended annual dental exams for proper oral hygiene   Community Resource Referral / Chronic Care Management: CRR required this visit?  No   CCM required this visit?  No     Plan:    Counseling was provided today regarding the following topics: healthy eating habits, home safety, vitamin and mineral supplementation (calcium and Vit D), regular exercise, tobacco avoidance, limitation of alcohol intake, use of seat belts, firearm safety, and fall prevention.  Annual recommendations include: influenza vaccine, dental cleanings, and eye exams.   I have personally reviewed and noted the following in the patient's chart:   Medical and social history Use of alcohol, tobacco or illicit drugs  Current medications and supplements including opioid prescriptions. Patient is not currently taking opioid prescriptions. Functional ability and status Nutritional status Physical activity Advanced  directives List of other physicians Hospitalizations, surgeries, and ER visits in previous 12 months Vitals Screenings to include cognitive, depression, and falls Referrals and appointments  In addition, I have reviewed and discussed with patient certain preventive protocols, quality metrics, and best practice recommendations. A written personalized care plan for preventive services as well as general preventive health recommendations were provided to patient.     Jacklynn Bue, LPN   1/61/0960   After Visit Summary: Available on MyChart

## 2023-06-11 NOTE — Patient Instructions (Signed)
William Clay , Thank you for taking time to come for your Medicare Wellness Visit. I appreciate your ongoing commitment to your health goals. Please review the following plan we discussed and let me know if I can assist you in the future.   These are the goals we discussed:  Goals      Prevent falls     1- Wear properly fitting, sturdy, flat shoes with nonskid soles.  2- Take a look around your home for potential fall hazards. To make your home safer: -Remove boxes, newspapers, electrical cords and phone cords from walkways. -Move coffee tables, magazine racks and plant stands from high-traffic areas. -Secure loose rugs with double-faced tape, tacks or a slip-resistant backing -- or remove loose rugs from your home. -Repair loose, wooden floorboards and carpeting right away. -Store [employer] fast streamer, dishes, food and other necessities within easy reach. -Immediately clean spilled liquids, grease or food. -Use nonslip mats in your bathtub or shower. Use a bath seat, which allows you to sit while showering.  3- Keep your home brightly lit to avoid tripping on objects that are hard to see         This is a list of the screening recommended for you and due dates:  Health Maintenance  Topic Date Due   Hepatitis C Screening  Never done   Flu Shot  07/24/2023   COVID-19 Vaccine (9 - 2023-24 season) 07/30/2023   Medicare Annual Wellness Visit  06/10/2024   DTaP/Tdap/Td vaccine (3 - Td or Tdap) 01/02/2032   Pneumonia Vaccine  Completed   Zoster (Shingles) Vaccine  Completed   HPV Vaccine  Aged Out   Screening for Lung Cancer  Discontinued   Colon Cancer Screening  Discontinued    Preventive Care 65 Years and Older, Male  Preventive care refers to lifestyle choices and visits with your health care provider that can promote health and wellness. What does preventive care include? A yearly physical exam. This is also called an annual well check. Dental exams once or twice a year. Routine eye  exams. Ask your health care provider how often you should have your eyes checked. Personal lifestyle choices, including: Daily care of your teeth and gums. Regular physical activity. Eating a healthy diet. Avoiding tobacco and drug use. Limiting alcohol use. Practicing safe sex. Taking low doses of aspirin every day. Taking vitamin and mineral supplements as recommended by your health care provider. What happens during an annual well check? The services and screenings done by your health care provider during your annual well check will depend on your age, overall health, lifestyle risk factors, and family history of disease. Counseling  Your health care provider may ask you questions about your: Alcohol use. Tobacco use. Drug use. Emotional well-being. Home and relationship well-being. Sexual activity. Eating habits. History of falls. Memory and ability to understand (cognition). Work and work Astronomer. Screening  You may have the following tests or measurements: Height, weight, and BMI. Blood pressure. Lipid and cholesterol levels. These may be checked every 5 years, or more frequently if you are over 7 years old. Skin check. Lung cancer screening. You may have this screening every year starting at age 71 if you have a 30-pack-year history of smoking and currently smoke or have quit within the past 15 years. Fecal occult blood test (FOBT) of the stool. You may have this test every year starting at age 48. Flexible sigmoidoscopy or colonoscopy. You may have a sigmoidoscopy every 5 years or a colonoscopy every  10 years starting at age 80. Prostate cancer screening. Recommendations will vary depending on your family history and other risks. Hepatitis C blood test. Hepatitis B blood test. Sexually transmitted disease (STD) testing. Diabetes screening. This is done by checking your blood sugar (glucose) after you have not eaten for a while (fasting). You may have this done every  1-3 years. Abdominal aortic aneurysm (AAA) screening. You may need this if you are a current or former smoker. Osteoporosis. You may be screened starting at age 55 if you are at high risk. Talk with your health care provider about your test results, treatment options, and if necessary, the need for more tests. Vaccines  Your health care provider may recommend certain vaccines, such as: Influenza vaccine. This is recommended every year. Tetanus, diphtheria, and acellular pertussis (Tdap, Td) vaccine. You may need a Td booster every 10 years. Zoster vaccine. You may need this after age 22. Pneumococcal 13-valent conjugate (PCV13) vaccine. One dose is recommended after age 44. Pneumococcal polysaccharide (PPSV23) vaccine. One dose is recommended after age 29. Talk to your health care provider about which screenings and vaccines you need and how often you need them. This information is not intended to replace advice given to you by your health care provider. Make sure you discuss any questions you have with your health care provider. Document Released: 01/05/2016 Document Revised: 08/28/2016 Document Reviewed: 10/10/2015 Elsevier Interactive Patient Education  2017 [employer].  Fall Prevention in the Home Falls can cause injuries. They can happen to people of all ages. There are many things you can do to make your home safe and to help prevent falls. What can I do on the outside of my home? Regularly fix the edges of walkways and driveways and fix any cracks. Remove anything that might make you trip as you walk through a door, such as a raised step or threshold. Trim any bushes or trees on the path to your home. Use bright outdoor lighting. Clear any walking paths of anything that might make someone trip, such as rocks or tools. Regularly check to see if handrails are loose or broken. Make sure that both sides of any steps have handrails. Any raised decks and porches should have guardrails on  the edges. Have any leaves, snow, or ice cleared regularly. Use sand or salt on walking paths during winter. Clean up any spills in your garage right away. This includes oil or grease spills. What can I do in the bathroom? Use night lights. Install grab bars by the toilet and in the tub and shower. Do not use towel bars as grab bars. Use non-skid mats or decals in the tub or shower. If you need to sit down in the shower, use a plastic, non-slip stool. Keep the floor dry. Clean up any water that spills on the floor as soon as it happens. Remove soap buildup in the tub or shower regularly. Attach bath mats securely with double-sided non-slip rug tape. Do not have throw rugs and other things on the floor that can make you trip. What can I do in the bedroom? Use night lights. Make sure that you have a light by your bed that is easy to reach. Do not use any sheets or blankets that are too big for your bed. They should not hang down onto the floor. Have a firm chair that has side arms. You can use this for support while you get dressed. Do not have throw rugs and other things on the  floor that can make you trip. What can I do in the kitchen? Clean up any spills right away. Avoid walking on wet floors. Keep items that you use a lot in easy-to-reach places. If you need to reach something above you, use a strong step stool that has a grab bar. Keep electrical cords out of the way. Do not use floor polish or wax that makes floors slippery. If you must use wax, use non-skid floor wax. Do not have throw rugs and other things on the floor that can make you trip. What can I do with my stairs? Do not leave any items on the stairs. Make sure that there are handrails on both sides of the stairs and use them. Fix handrails that are broken or loose. Make sure that handrails are as long as the stairways. Check any carpeting to make sure that it is firmly attached to the stairs. Fix any carpet that is loose  or worn. Avoid having throw rugs at the top or bottom of the stairs. If you do have throw rugs, attach them to the floor with carpet tape. Make sure that you have a light switch at the top of the stairs and the bottom of the stairs. If you do not have them, ask someone to add them for you. What else can I do to help prevent falls? Wear shoes that: Do not have high heels. Have rubber bottoms. Are comfortable and fit you well. Are closed at the toe. Do not wear sandals. If you use a stepladder: Make sure that it is fully opened. Do not climb a closed stepladder. Make sure that both sides of the stepladder are locked into place. Ask someone to hold it for you, if possible. Clearly mark and make sure that you can see: Any grab bars or handrails. First and last steps. Where the edge of each step is. Use tools that help you move around (mobility aids) if they are needed. These include: Canes. Walkers. Scooters. Crutches. Turn on the lights when you go into a dark area. Replace any light bulbs as soon as they burn out. Set up your furniture so you have a clear path. Avoid moving your furniture around. If any of your floors are uneven, fix them. If there are any pets around you, be aware of where they are. Review your medicines with your doctor. Some medicines can make you feel dizzy. This can increase your chance of falling. Ask your doctor what other things that you can do to help prevent falls. This information is not intended to replace advice given to you by your health care provider. Make sure you discuss any questions you have with your health care provider. Document Released: 10/05/2009 Document Revised: 05/16/2016 Document Reviewed: 01/13/2015 Elsevier Interactive Patient Education  2017 [employer].

## 2023-07-14 DIAGNOSIS — J9610 Chronic respiratory failure, unspecified whether with hypoxia or hypercapnia: Secondary | ICD-10-CM | POA: Diagnosis not present

## 2023-07-14 DIAGNOSIS — I959 Hypotension, unspecified: Secondary | ICD-10-CM | POA: Diagnosis not present

## 2023-07-14 DIAGNOSIS — D649 Anemia, unspecified: Secondary | ICD-10-CM | POA: Diagnosis not present

## 2023-07-14 DIAGNOSIS — R9431 Abnormal electrocardiogram [ECG] [EKG]: Secondary | ICD-10-CM | POA: Diagnosis not present

## 2023-08-23 ENCOUNTER — Other Ambulatory Visit: Payer: Self-pay

## 2023-08-23 DIAGNOSIS — R7302 Impaired glucose tolerance (oral): Secondary | ICD-10-CM

## 2023-08-23 DIAGNOSIS — I10 Essential (primary) hypertension: Secondary | ICD-10-CM

## 2023-08-23 DIAGNOSIS — E782 Mixed hyperlipidemia: Secondary | ICD-10-CM

## 2023-08-23 DIAGNOSIS — K219 Gastro-esophageal reflux disease without esophagitis: Secondary | ICD-10-CM

## 2023-08-26 ENCOUNTER — Other Ambulatory Visit: Payer: HMO

## 2023-08-28 ENCOUNTER — Ambulatory Visit (INDEPENDENT_AMBULATORY_CARE_PROVIDER_SITE_OTHER): Payer: HMO | Admitting: Family Medicine

## 2023-08-28 ENCOUNTER — Encounter: Payer: Self-pay | Admitting: Family Medicine

## 2023-08-28 VITALS — BP 124/74 | HR 62 | Temp 97.0°F | Ht 68.0 in | Wt 163.0 lb

## 2023-08-28 DIAGNOSIS — F330 Major depressive disorder, recurrent, mild: Secondary | ICD-10-CM | POA: Diagnosis not present

## 2023-08-28 DIAGNOSIS — K219 Gastro-esophageal reflux disease without esophagitis: Secondary | ICD-10-CM

## 2023-08-28 DIAGNOSIS — Z9981 Dependence on supplemental oxygen: Secondary | ICD-10-CM

## 2023-08-28 DIAGNOSIS — E782 Mixed hyperlipidemia: Secondary | ICD-10-CM

## 2023-08-28 DIAGNOSIS — R2689 Other abnormalities of gait and mobility: Secondary | ICD-10-CM | POA: Diagnosis not present

## 2023-08-28 DIAGNOSIS — I700 Atherosclerosis of aorta: Secondary | ICD-10-CM

## 2023-08-28 DIAGNOSIS — R7302 Impaired glucose tolerance (oral): Secondary | ICD-10-CM

## 2023-08-28 DIAGNOSIS — J9611 Chronic respiratory failure with hypoxia: Secondary | ICD-10-CM

## 2023-08-28 DIAGNOSIS — G4733 Obstructive sleep apnea (adult) (pediatric): Secondary | ICD-10-CM

## 2023-08-28 DIAGNOSIS — R413 Other amnesia: Secondary | ICD-10-CM | POA: Diagnosis not present

## 2023-08-28 DIAGNOSIS — I10 Essential (primary) hypertension: Secondary | ICD-10-CM

## 2023-08-28 DIAGNOSIS — Z6824 Body mass index (BMI) 24.0-24.9, adult: Secondary | ICD-10-CM

## 2023-08-28 NOTE — Assessment & Plan Note (Signed)
Continue eating healthy.  Recommend exercise

## 2023-08-28 NOTE — Assessment & Plan Note (Signed)
Well controlled.  No medication changes recommended. Losartan 100 mg daily, Norvasc 10 mg daily, Asa 81  Continue healthy diet and exercise.

## 2023-08-28 NOTE — Progress Notes (Unsigned)
Subjective:  Patient ID: William Clay, male    DOB: October 23, 1941  Age: 82 y.o. MRN: [id number]  Chief Complaint  Patient presents with   Medical Management of Chronic Issues    HPI HTN: Norvasc 10 mg daily, Asa 81 and Plavix, Losartan 100 mg daily   Hyperlipidemia: Crestor 40 mg daily   GERD: Protonix 40 mg daily   Anxiety/Depression: Sertraline150 mg daily.    Prediabetes: A1c 5.7   OSA with cpap and O2 3L   Chronic respiratory failure with hypoxia: uses 3 L oxygen almost 24 hours per day.    [facility]  Declined Flu vaccine.     08/28/2023    2:03 PM 06/11/2023    3:43 PM 05/22/2023   10:47 AM 11/25/2022   11:06 AM 08/30/2022   11:46 AM  Depression screen PHQ 2/9  Decreased Interest 0 1 1 1  0  Down, Depressed, Hopeless 0 0 0 0 0  PHQ - 2 Score 0 1 1 1  0  Altered sleeping  0 0    Tired, decreased energy  0 0    Change in appetite  0 0    Feeling bad or failure about yourself   0 0    Trouble concentrating  0 0    Moving slowly or fidgety/restless  0 0    Suicidal thoughts  0 0    PHQ-9 Score  1 1    Difficult doing work/chores  Not difficult at all Not difficult at all          06/11/2023    3:45 PM  Fall Risk   Falls in the past year? 1  Number falls in past yr: 1  Injury with Fall? 0  Risk for fall due to : History of fall(s);Impaired balance/gait  Follow up Falls evaluation completed;Education provided    Patient Care Team: Blane Ohara, MD as PCP - General (Family Medicine) Adelfa Koh, MD as Referring Physician (Orthopedic Surgery) Zettie Pho, [facility] (Inactive) (Pharmacist)   Review of Systems  Constitutional:  Negative for chills, diaphoresis, fatigue and fever.  HENT:  Negative for congestion, ear pain and sore throat.   Respiratory:  Negative for cough and shortness of breath.   Cardiovascular:  Negative for chest pain and leg swelling.  Gastrointestinal:  Negative for abdominal pain, constipation, diarrhea, nausea and  vomiting.  Genitourinary:  Negative for dysuria and urgency.  Musculoskeletal:  Positive for arthralgias and back pain. Negative for myalgias.  Neurological:  Negative for dizziness and headaches.  Psychiatric/Behavioral:  Negative for dysphoric mood.     Current Outpatient Medications on File Prior to Visit  Medication Sig Dispense Refill   albuterol (PROVENTIL HFA;VENTOLIN HFA) 108 (90 Base) MCG/ACT inhaler Inhale two puffs every four to six hours as needed for cough or wheeze. 1 Inhaler 1   amLODipine (NORVASC) 10 MG tablet Take 0.5 tablets (5 mg total) by mouth daily. 1 tablet 0   aspirin EC 81 MG tablet Take 81 mg by mouth daily. Swallow whole.     chlorpheniramine (CHLOR-TRIMETON) 4 MG tablet Take 4 mg by mouth 2 (two) times daily as needed for allergies.     clopidogrel (PLAVIX) 75 MG tablet Take 1 tablet by mouth daily.     isosorbide mononitrate (IMDUR) 30 MG 24 hr tablet Take 30 mg by mouth at bedtime.     losartan (COZAAR) 100 MG tablet TAKE 1 TABLET BY MOUTH EVERY DAY 90 tablet 0   Multiple Vitamins-Minerals (MULTIVITAMIN  WITH MINERALS) tablet Take 1 tablet by mouth daily.     nitroGLYCERIN (NITROSTAT) 0.4 MG SL tablet As directed for heart pain     pantoprazole (PROTONIX) 40 MG tablet Take 1 tablet (40 mg total) by mouth 2 (two) times daily before a meal. 180 tablet 3   rosuvastatin (CRESTOR) 40 MG tablet Take 1 tablet (40 mg total) by mouth at bedtime. 90 tablet 1   sertraline (ZOLOFT) 100 MG tablet TAKE 1 TABLET BY MOUTH ONCE DAILY (Patient taking differently: Taking 1.5 tablets daily) 90 tablet 3   Tiotropium Bromide-Olodaterol 2.5-2.5 MCG/ACT AERS Inhale 2 puffs into the lungs daily.      No current facility-administered medications on file prior to visit.   Past Medical History:  Diagnosis Date   Atherosclerosis of aorta (HCC)    Bowel obstruction (HCC) 2018   BPH (benign prostatic hyperplasia)    COPD (chronic obstructive pulmonary disease) (HCC)    COPD (chronic  obstructive pulmonary disease) (HCC)    GERD (gastroesophageal reflux disease)    Hypertension    Hypoxemia    Lumbar stenosis with neurogenic claudication 04/24/2017   Mixed hyperlipidemia    Osteoarthritis    Pneumonia 2016,2017   Primary insomnia    Sleep apnea    Spontaneous ecchymoses    Past Surgical History:  Procedure Laterality Date   ABDOMINAL AORTIC ANEURYSM REPAIR     BACK SURGERY N/A [phone], 2021   Lower back and cervical spine   BACK SURGERY  1984   upper back   CATARACT EXTRACTION Left 2017   CATARACT EXTRACTION Right 2017   CHOLECYSTECTOMY  2018   HERNIA REPAIR  2018   PROSTATECTOMY     ROTATOR CUFF REPAIR Right 2015   ROTATOR CUFF REPAIR Left 2017    Family History  Problem Relation Age of Onset   Heart failure Mother    Diabetes Mother    CAD Mother    Hypertension Mother    Social History   Socioeconomic History   Marital status: Widowed    Spouse name: Bonita Quin   Number of children: 2   Years of education: Not on file   Highest education level: Some college, no degree  Occupational History   Occupation: retired    Comment: Patent examiner   Occupation: Retired Pharmacologist  Tobacco Use   Smoking status: Former    Current packs/day: 0.00    Average packs/day: 2.0 packs/day for 50.0 years (100.0 ttl pk-yrs)    Types: Cigarettes    Start date: 12/23/1957    Quit date: 12/24/2007    Years since quitting: 15.6   Smokeless tobacco: Never  Vaping Use   Vaping status: Never Used  Substance and Sexual Activity   Alcohol use: Never   Drug use: Never   Sexual activity: Not Currently  Other Topics Concern   Not on file  Social History Narrative   Not on file   Social Determinants of Health   Financial Resource Strain: Low Risk  (08/26/2023)   Overall Financial Resource Strain (CARDIA)    Difficulty of Paying Living Expenses: Not very hard  Food Insecurity: No Food Insecurity (08/26/2023)   Hunger Vital Sign     Worried About Running Out of Food in the Last Year: Never true    Ran Out of Food in the Last Year: Never true  Transportation Needs: No Transportation Needs (08/26/2023)   PRAPARE - Transportation    Lack of Transportation (Medical): No    Lack  of Transportation (Non-Medical): No  Physical Activity: Unknown (08/26/2023)   Exercise Vital Sign    Days of Exercise per Week: Patient declined    Minutes of Exercise per Session: 30 min  Recent Concern: Physical Activity - Insufficiently Active (06/11/2023)   Exercise Vital Sign    Days of Exercise per Week: 3 days    Minutes of Exercise per Session: 30 min  Stress: Stress Concern Present (08/26/2023)   [employer] of Occupational Health - Occupational Stress Questionnaire    Feeling of Stress : To some extent  Social Connections: Moderately Integrated (08/26/2023)   Social Connection and Isolation Panel [NHANES]    Frequency of Communication with Friends and Family: More than three times a week    Frequency of Social Gatherings with Friends and Family: Three times a week    Attends Religious Services: More than 4 times per year    Active Member of Clubs or Organizations: Yes    Attends Banker Meetings: More than 4 times per year    Marital Status: Widowed  Recent Concern: Social Connections - Moderately Isolated (06/11/2023)   Social Connection and Isolation Panel [NHANES]    Frequency of Communication with Friends and Family: Three times a week    Frequency of Social Gatherings with Friends and Family: Three times a week    Attends Religious Services: More than 4 times per year    Active Member of Clubs or Organizations: No    Attends Banker Meetings: Never    Marital Status: Widowed    Objective:  BP 124/74   Pulse 62   Temp (!) 97 F (36.1 C)   Ht 5\' 8"  (1.727 m)   Wt 163 lb (73.9 kg)   SpO2 93%   BMI 24.78 kg/m      08/28/2023    2:00 PM 06/11/2023    2:50 PM 05/22/2023   10:43 AM  BP/Weight   Systolic BP 124 110 124  Diastolic BP 74 60 80  Wt. (Lbs) 163 174 173  BMI 24.78 kg/m2 26.46 kg/m2 26.3 kg/m2    Physical Exam Vitals reviewed.  Constitutional:      Appearance: Normal appearance. He is normal weight.  Neck:     Vascular: No carotid bruit.  Cardiovascular:     Rate and Rhythm: Normal rate and regular rhythm.     Heart sounds: No murmur heard. Pulmonary:     Effort: Pulmonary effort is normal.     Breath sounds: Normal breath sounds. No wheezing.     Comments: Left Lower ribs tender Abdominal:     General: Abdomen is flat. Bowel sounds are normal.     Palpations: Abdomen is soft.     Tenderness: There is no abdominal tenderness.     Hernia: A hernia is present. Hernia is present in the umbilical area.  Neurological:     Mental Status: He is alert and oriented to person, place, and time.  Psychiatric:        Mood and Affect: Mood normal.        Behavior: Behavior normal.     Diabetic Foot Exam - Simple   No data filed      Lab Results  Component Value Date   WBC 9.7 08/28/2023   HGB 13.7 08/28/2023   HCT 42.6 08/28/2023   PLT 265 08/28/2023   GLUCOSE 74 08/28/2023   CHOL 127 05/20/2023   TRIG 100 05/20/2023   HDL 48 05/20/2023   LDLCALC 60  05/20/2023   ALT 22 08/28/2023   AST 29 08/28/2023   NA 141 08/28/2023   K 4.1 08/28/2023   CL 108 (H) 08/28/2023   CREATININE 0.94 08/28/2023   BUN 18 08/28/2023   CO2 20 08/28/2023   TSH 1.220 08/28/2023   HGBA1C 5.7 (H) 05/20/2023      Assessment & Plan:    Mixed hyperlipidemia Assessment & Plan: Well controlled.  No changes to medicines. Continue crestor 40 mg once daily. Continue to work on eating a healthy diet and exercise.  Labs ordered    Essential hypertension, benign Assessment & Plan: Well controlled.  No medication changes recommended. Losartan 100 mg daily, Norvasc 10 mg daily, Asa 81  Continue healthy diet and exercise.    Orders: -     CBC with Differential/Platelet -      Comprehensive metabolic panel  Gastroesophageal reflux disease without esophagitis Assessment & Plan: Continue current medication   Imbalance Assessment & Plan: Check labs   Impaired glucose tolerance Assessment & Plan: Continue eating healthy.  Recommend exercise   Mild recurrent major depression (HCC) Assessment & Plan: The current medical regimen is effective;  continue present plan and medications.  Sertraline150 mg daily.    Oxygen dependent Assessment & Plan: Continue oxygen 2 L at night.    OSA on CPAP Assessment & Plan: Continue to wear cpap with oxygen.   Memory loss Assessment & Plan: Check labs  Orders: -     Methylmalonic acid, serum -     B12 and Folate Panel -     TSH  Chronic respiratory failure with hypoxia (HCC) Assessment & Plan: Continue oxygen   Aortic atherosclerosis (HCC) Assessment & Plan: Continue crestor 40 mg before bed and aspirin 81 mg daily.       No orders of the defined types were placed in this encounter.   Orders Placed This Encounter  Procedures   CBC with Differential/Platelet   Comprehensive metabolic panel   Methylmalonic acid, serum   B12 and Folate Panel   TSH     Follow-up: Return in about 3 months (around 11/27/2023) for chronic.   I,Katherina A Bramblett,acting as a scribe for Blane Ohara, MD.,have documented all relevant documentation on the behalf of Blane Ohara, MD,as directed by  Blane Ohara, MD while in the presence of Blane Ohara, MD.   Clayborn Bigness I Leal-Borjas,acting as a scribe for Blane Ohara, MD.,have documented all relevant documentation on the behalf of Blane Ohara, MD,as directed by  Blane Ohara, MD while in the presence of Blane Ohara, MD.    An After Visit Summary was printed and given to the patient.  Blane Ohara, MD [facility] [phone]

## 2023-08-28 NOTE — Assessment & Plan Note (Signed)
Well controlled.  No changes to medicines. Continue crestor 40 mg once daily. Continue to work on eating a healthy diet and exercise.  Labs ordered

## 2023-08-28 NOTE — Patient Instructions (Signed)
Take tylenol over ibuprofen.

## 2023-08-28 NOTE — Assessment & Plan Note (Signed)
Continue current medication.

## 2023-08-29 LAB — COMPREHENSIVE METABOLIC PANEL
ALT: 22 IU/L (ref 0–44)
AST: 29 IU/L (ref 0–40)
Albumin: 4.4 g/dL (ref 3.8–4.8)
Alkaline Phosphatase: 103 IU/L (ref 44–121)
BUN/Creatinine Ratio: 19 (ref 10–24)
BUN: 18 mg/dL (ref 8–27)
Bilirubin Total: 0.7 mg/dL (ref 0.0–1.2)
CO2: 20 mmol/L (ref 20–29)
Calcium: 9.4 mg/dL (ref 8.6–10.2)
Chloride: 108 mmol/L — ABNORMAL HIGH (ref 96–106)
Creatinine, Ser: 0.94 mg/dL (ref 0.76–1.27)
Globulin, Total: 2.2 g/dL (ref 1.5–4.5)
Glucose: 74 mg/dL (ref 70–99)
Potassium: 4.1 mmol/L (ref 3.5–5.2)
Sodium: 141 mmol/L (ref 134–144)
Total Protein: 6.6 g/dL (ref 6.0–8.5)
eGFR: 82 mL/min/{1.73_m2} (ref >59)

## 2023-08-29 LAB — CBC WITH DIFFERENTIAL/PLATELET
Basophils Absolute: 0.1 10*3/uL (ref 0.0–0.2)
Basos: 1 %
EOS (ABSOLUTE): 0.3 10*3/uL (ref 0.0–0.4)
Eos: 3 %
Hematocrit: 42.6 % (ref 37.5–51.0)
Hemoglobin: 13.7 g/dL (ref 13.0–17.7)
Immature Grans (Abs): 0 10*3/uL (ref 0.0–0.1)
Immature Granulocytes: 0 %
Lymphocytes Absolute: 1.1 10*3/uL (ref 0.7–3.1)
Lymphs: 11 %
MCH: 30.6 pg (ref 26.6–33.0)
MCHC: 32.2 g/dL (ref 31.5–35.7)
MCV: 95 fL (ref 79–97)
Monocytes Absolute: 0.9 10*3/uL (ref 0.1–0.9)
Monocytes: 10 %
Neutrophils Absolute: 7.3 10*3/uL — ABNORMAL HIGH (ref 1.4–7.0)
Neutrophils: 75 %
Platelets: 265 10*3/uL (ref 150–450)
RBC: 4.47 x10E6/uL (ref 4.14–5.80)
RDW: 13.5 % (ref 11.6–15.4)
WBC: 9.7 10*3/uL (ref 3.4–10.8)

## 2023-08-29 LAB — TSH: TSH: 1.22 u[IU]/mL (ref 0.450–4.500)

## 2023-08-30 DIAGNOSIS — R413 Other amnesia: Secondary | ICD-10-CM | POA: Insufficient documentation

## 2023-08-30 DIAGNOSIS — Z6824 Body mass index (BMI) 24.0-24.9, adult: Secondary | ICD-10-CM | POA: Insufficient documentation

## 2023-08-30 NOTE — Assessment & Plan Note (Signed)
Check labs 

## 2023-08-30 NOTE — Assessment & Plan Note (Signed)
Continue oxygen 2 L at night.

## 2023-08-30 NOTE — Assessment & Plan Note (Signed)
Continue to wear cpap with oxygen. 

## 2023-08-30 NOTE — Assessment & Plan Note (Signed)

## 2023-08-30 NOTE — Assessment & Plan Note (Signed)
The current medical regimen is effective;  continue present plan and medications.  Sertraline150 mg daily.

## 2023-08-31 NOTE — Assessment & Plan Note (Addendum)
Continue crestor 40 mg before bed and aspirin 81 mg daily.

## 2023-08-31 NOTE — Assessment & Plan Note (Signed)
Continue oxygen. 

## 2023-09-01 LAB — METHYLMALONIC ACID, SERUM: Methylmalonic Acid: 241 nmol/L (ref 0–378)

## 2023-09-01 LAB — B12 AND FOLATE PANEL
Folate: 7.5 ng/mL (ref >3.0)
Vitamin B-12: 502 pg/mL (ref 232–1245)

## 2023-09-04 DIAGNOSIS — Z9842 Cataract extraction status, left eye: Secondary | ICD-10-CM | POA: Diagnosis not present

## 2023-09-04 DIAGNOSIS — H52223 Regular astigmatism, bilateral: Secondary | ICD-10-CM | POA: Diagnosis not present

## 2023-09-04 DIAGNOSIS — H524 Presbyopia: Secondary | ICD-10-CM | POA: Diagnosis not present

## 2023-09-04 DIAGNOSIS — Z9841 Cataract extraction status, right eye: Secondary | ICD-10-CM | POA: Diagnosis not present

## 2023-09-04 DIAGNOSIS — H5203 Hypermetropia, bilateral: Secondary | ICD-10-CM | POA: Diagnosis not present

## 2023-09-04 DIAGNOSIS — Z961 Presence of intraocular lens: Secondary | ICD-10-CM | POA: Diagnosis not present

## 2023-11-25 ENCOUNTER — Other Ambulatory Visit: Payer: Self-pay

## 2023-11-25 DIAGNOSIS — R7303 Prediabetes: Secondary | ICD-10-CM

## 2023-11-25 DIAGNOSIS — I10 Essential (primary) hypertension: Secondary | ICD-10-CM

## 2023-11-25 DIAGNOSIS — E782 Mixed hyperlipidemia: Secondary | ICD-10-CM

## 2023-11-27 ENCOUNTER — Other Ambulatory Visit: Payer: HMO

## 2023-11-27 DIAGNOSIS — I10 Essential (primary) hypertension: Secondary | ICD-10-CM

## 2023-11-27 DIAGNOSIS — E782 Mixed hyperlipidemia: Secondary | ICD-10-CM

## 2023-11-27 DIAGNOSIS — R7303 Prediabetes: Secondary | ICD-10-CM

## 2023-11-28 LAB — COMPREHENSIVE METABOLIC PANEL
ALT: 29 [IU]/L (ref 0–44)
AST: 33 [IU]/L (ref 0–40)
Albumin: 4.4 g/dL (ref 3.8–4.8)
Alkaline Phosphatase: 105 [IU]/L (ref 44–121)
BUN/Creatinine Ratio: 18 (ref 10–24)
BUN: 17 mg/dL (ref 8–27)
Bilirubin Total: 0.7 mg/dL (ref 0.0–1.2)
CO2: 21 mmol/L (ref 20–29)
Calcium: 10.1 mg/dL (ref 8.6–10.2)
Chloride: 106 mmol/L (ref 96–106)
Creatinine, Ser: 0.92 mg/dL (ref 0.76–1.27)
Globulin, Total: 2.4 g/dL (ref 1.5–4.5)
Glucose: 80 mg/dL (ref 70–99)
Potassium: 4.4 mmol/L (ref 3.5–5.2)
Sodium: 143 mmol/L (ref 134–144)
Total Protein: 6.8 g/dL (ref 6.0–8.5)
eGFR: 84 mL/min/{1.73_m2} (ref >59)

## 2023-11-28 LAB — LIPID PANEL
Chol/HDL Ratio: 3 {ratio} (ref 0.0–5.0)
Cholesterol, Total: 146 mg/dL (ref 100–199)
HDL: 49 mg/dL (ref >39)
LDL Chol Calc (NIH): 72 mg/dL (ref 0–99)
Triglycerides: 144 mg/dL (ref 0–149)
VLDL Cholesterol Cal: 25 mg/dL (ref 5–40)

## 2023-11-28 LAB — CBC WITH DIFFERENTIAL/PLATELET
Basophils Absolute: 0.1 10*3/uL (ref 0.0–0.2)
Basos: 1 %
EOS (ABSOLUTE): 0.2 10*3/uL (ref 0.0–0.4)
Eos: 2 %
Hematocrit: 45.1 % (ref 37.5–51.0)
Hemoglobin: 14.2 g/dL (ref 13.0–17.7)
Immature Grans (Abs): 0 10*3/uL (ref 0.0–0.1)
Immature Granulocytes: 0 %
Lymphocytes Absolute: 0.9 10*3/uL (ref 0.7–3.1)
Lymphs: 9 %
MCH: 31 pg (ref 26.6–33.0)
MCHC: 31.5 g/dL (ref 31.5–35.7)
MCV: 99 fL — ABNORMAL HIGH (ref 79–97)
Monocytes Absolute: 1 10*3/uL — ABNORMAL HIGH (ref 0.1–0.9)
Monocytes: 10 %
Neutrophils Absolute: 7.4 10*3/uL — ABNORMAL HIGH (ref 1.4–7.0)
Neutrophils: 78 %
Platelets: 296 10*3/uL (ref 150–450)
RBC: 4.58 x10E6/uL (ref 4.14–5.80)
RDW: 13.6 % (ref 11.6–15.4)
WBC: 9.7 10*3/uL (ref 3.4–10.8)

## 2023-11-28 LAB — HEMOGLOBIN A1C
Est. average glucose Bld gHb Est-mCnc: 114 mg/dL
Hgb A1c MFr Bld: 5.6 % (ref 4.8–5.6)

## 2023-11-30 NOTE — Progress Notes (Unsigned)
Subjective:  Patient ID: William Clay, male    DOB: [birth year]  Age: 82 y.o. MRN: [id number]  Chief Complaint  Patient presents with   Medical Management of Chronic Issues    HPI History of Present Illness The patient, with a history of abdominal hernia repair, presents with persistent right lower abdominal pain. He describes the pain as a 'burning' sensation, likening it to 'somebody's sticking a knife in me.' The pain is located at the site of a previous surgical repair for a hernia and gallbladder removal. Despite the repair, the patient reports that the pain has persisted for several years. He denies fevers, chills, sweats, earaches, sore throat, and stuffy nose. He reports constipation, for which he takes a prescription medication from the [place] as needed. However, bowel movements do not alleviate the abdominal pain.  The patient also reports occasional coughing and a runny nose, which he attributes to allergies. He has been taking allergy medication for several years. He is on three liters of oxygen for an unspecified respiratory condition. He also reports a recent fall caused by his dog, which resulted in minor skin injuries but no significant harm.  The patient is on a regimen of Norvasc, losartan, isosorbide, pantoprazole, Crestor, and sertraline. He also uses a CPAP machine for sleep apnea. He denies any recent changes in his medication regimen.  HTN: Norvasc 10 mg daily, Imudr 30 mg daily, Asa 81 and Plavix, Losartan 100 mg daily   Hyperlipidemia: Crestor 40 mg daily   GERD: Protonix 40 mg daily    Anxiety/Depression: Sertraline150 mg daily.    Prediabetes: A1c 5.7   OSA with cpap and O2 3L   Chronic respiratory failure with hypoxia: uses 3 L oxygen almost 24 hours per day.      12/01/2023    1:26 PM 08/28/2023    2:03 PM 06/11/2023    3:43 PM 05/22/2023   10:47 AM 11/25/2022   11:06 AM  Depression screen PHQ 2/9  Decreased Interest 0 0 1 1 1   Down, Depressed, Hopeless 0  0 0 0 0  PHQ - 2 Score 0 0 1 1 1   Altered sleeping 0  0 0   Tired, decreased energy 0  0 0   Change in appetite 0  0 0   Feeling bad or failure about yourself  0  0 0   Trouble concentrating 0  0 0   Moving slowly or fidgety/restless 0  0 0   Suicidal thoughts 0  0 0   PHQ-9 Score 0  1 1   Difficult doing work/chores Not difficult at all  Not difficult at all Not difficult at all         12/01/2023    1:37 PM  Fall Risk   Falls in the past year? 1  Number falls in past yr: 0  Injury with Fall? 0  Risk for fall due to : History of fall(s)  Follow up Falls evaluation completed;Falls prevention discussed    Patient Care Team: Blane Ohara, MD as PCP - General (Family Medicine) Adelfa Koh, MD as Referring Physician (Orthopedic Surgery) Zettie Pho, [facility] (Inactive) (Pharmacist)   Review of Systems  Constitutional:  Negative for appetite change, fatigue and fever.  HENT:  Negative for congestion, ear pain, sinus pressure and sore throat.   Respiratory:  Positive for shortness of breath (3 L O2.). Negative for cough, chest tightness and wheezing.   Cardiovascular:  Negative for chest pain and palpitations.  Gastrointestinal:  Positive for abdominal pain (lower abdomen) and constipation (Occasionally has to take a rx medicine for constipation he got from the [place].). Negative for diarrhea, nausea and vomiting.  Genitourinary:  Negative for dysuria and hematuria.  Musculoskeletal:  Negative for arthralgias, back pain, joint swelling and myalgias.  Neurological:  Negative for dizziness, weakness and headaches.  Psychiatric/Behavioral:  Negative for dysphoric mood. The patient is not nervous/anxious.     Current Outpatient Medications on File Prior to Visit  Medication Sig Dispense Refill   albuterol (PROVENTIL HFA;VENTOLIN HFA) 108 (90 Base) MCG/ACT inhaler Inhale two puffs every four to six hours as needed for cough or wheeze. 1 Inhaler 1   amLODipine (NORVASC) 10  MG tablet Take 0.5 tablets (5 mg total) by mouth daily. 1 tablet 0   aspirin EC 81 MG tablet Take 81 mg by mouth daily. Swallow whole.     chlorpheniramine (CHLOR-TRIMETON) 4 MG tablet Take 4 mg by mouth 2 (two) times daily as needed for allergies.     clopidogrel (PLAVIX) 75 MG tablet Take 1 tablet by mouth daily.     isosorbide mononitrate (IMDUR) 30 MG 24 hr tablet Take 30 mg by mouth at bedtime.     losartan (COZAAR) 100 MG tablet TAKE 1 TABLET BY MOUTH EVERY DAY 90 tablet 0   Multiple Vitamins-Minerals (MULTIVITAMIN WITH MINERALS) tablet Take 1 tablet by mouth daily.     nitroGLYCERIN (NITROSTAT) 0.4 MG SL tablet As directed for heart pain     pantoprazole (PROTONIX) 40 MG tablet Take 1 tablet (40 mg total) by mouth 2 (two) times daily before a meal. 180 tablet 3   rosuvastatin (CRESTOR) 40 MG tablet Take 1 tablet (40 mg total) by mouth at bedtime. 90 tablet 1   sertraline (ZOLOFT) 100 MG tablet TAKE 1 TABLET BY MOUTH ONCE DAILY (Patient taking differently: Taking 1.5 tablets daily) 90 tablet 3   Tiotropium Bromide-Olodaterol 2.5-2.5 MCG/ACT AERS Inhale 2 puffs into the lungs daily.      No current facility-administered medications on file prior to visit.   Past Medical History:  Diagnosis Date   Atherosclerosis of aorta (HCC)    Bowel obstruction (HCC) 2018   BPH (benign prostatic hyperplasia)    COPD (chronic obstructive pulmonary disease) (HCC)    COPD (chronic obstructive pulmonary disease) (HCC)    GERD (gastroesophageal reflux disease)    Hypertension    Hypoxemia    Lumbar stenosis with neurogenic claudication 04/24/2017   Mixed hyperlipidemia    Osteoarthritis    Pneumonia 2016,2017   Primary insomnia    Sleep apnea    Spontaneous ecchymoses    Past Surgical History:  Procedure Laterality Date   ABDOMINAL AORTIC ANEURYSM REPAIR     BACK SURGERY N/A [phone], 2021   Lower back and cervical spine   BACK SURGERY  1984   upper back   CATARACT  EXTRACTION Left 2017   CATARACT EXTRACTION Right 2017   CHOLECYSTECTOMY  2018   HERNIA REPAIR  2018   PROSTATECTOMY     ROTATOR CUFF REPAIR Right 2015   ROTATOR CUFF REPAIR Left 2017    Family History  Problem Relation Age of Onset   Heart failure Mother    Diabetes Mother    CAD Mother    Hypertension Mother    Social History   Socioeconomic History   Marital status: Widowed    Spouse name: Bonita Quin   Number of children: 2   Years of education: Not on file  Highest education level: Some college, no degree  Occupational History   Occupation: retired    Comment: Patent examiner   Occupation: Retired Pharmacologist  Tobacco Use   Smoking status: Former    Current packs/day: 0.00    Average packs/day: 2.0 packs/day for 50.0 years (100.0 ttl pk-yrs)    Types: Cigarettes    Start date: 12/23/1957    Quit date: 12/24/2007    Years since quitting: 15.9   Smokeless tobacco: Never  Vaping Use   Vaping status: Never Used  Substance and Sexual Activity   Alcohol use: Never   Drug use: Never   Sexual activity: Not Currently  Other Topics Concern   Not on file  Social History Narrative   Not on file   Social Drivers of Health   Financial Resource Strain: Low Risk  (08/26/2023)   Overall Financial Resource Strain (CARDIA)    Difficulty of Paying Living Expenses: Not very hard  Food Insecurity: No Food Insecurity (08/26/2023)   Hunger Vital Sign    Worried About Running Out of Food in the Last Year: Never true    Ran Out of Food in the Last Year: Never true  Transportation Needs: No Transportation Needs (08/26/2023)   PRAPARE - Administrator, Civil Service (Medical): No    Lack of Transportation (Non-Medical): No  Physical Activity: Unknown (08/26/2023)   Exercise Vital Sign    Days of Exercise per Week: Patient declined    Minutes of Exercise per Session: 30 min  Recent Concern: Physical Activity - Insufficiently Active (06/11/2023)   Exercise Vital Sign    Days of  Exercise per Week: 3 days    Minutes of Exercise per Session: 30 min  Stress: Stress Concern Present (08/26/2023)   [employer] of Occupational Health - Occupational Stress Questionnaire    Feeling of Stress : To some extent  Social Connections: Moderately Integrated (08/26/2023)   Social Connection and Isolation Panel [NHANES]    Frequency of Communication with Friends and Family: More than three times a week    Frequency of Social Gatherings with Friends and Family: Three times a week    Attends Religious Services: More than 4 times per year    Active Member of Clubs or Organizations: Yes    Attends Banker Meetings: More than 4 times per year    Marital Status: Widowed  Recent Concern: Social Connections - Moderately Isolated (06/11/2023)   Social Connection and Isolation Panel [NHANES]    Frequency of Communication with Friends and Family: Three times a week    Frequency of Social Gatherings with Friends and Family: Three times a week    Attends Religious Services: More than 4 times per year    Active Member of Clubs or Organizations: No    Attends Banker Meetings: Never    Marital Status: Widowed    Objective:  BP 132/82 (BP Location: Left Arm, Patient Position: Sitting)   Pulse 73   Temp 97.6 F (36.4 C) (Temporal)   Ht 5\' 8"  (1.727 m)   Wt 172 lb (78 kg)   SpO2 96%   BMI 26.15 kg/m      12/01/2023    1:27 PM 08/28/2023    2:00 PM 06/11/2023    2:50 PM  BP/Weight  Systolic BP 132 124 110  Diastolic BP 82 74 60  Wt. (Lbs) 172 163 174  BMI 26.15 kg/m2 24.78 kg/m2 26.46 kg/m2    Physical Exam Vitals  reviewed.  Constitutional:      Appearance: Normal appearance.  Neck:     Vascular: No carotid bruit.  Cardiovascular:     Rate and Rhythm: Normal rate and regular rhythm.     Pulses: Normal pulses.     Heart sounds: Normal heart sounds.  Pulmonary:     Effort: Pulmonary effort is normal.     Breath sounds: Normal breath sounds. No  wheezing, rhonchi or rales.  Abdominal:     General: Bowel sounds are normal.     Palpations: Abdomen is soft.     Tenderness: There is abdominal tenderness (RUQ).  Neurological:     Mental Status: He is alert.  Psychiatric:        Mood and Affect: Mood normal.        Behavior: Behavior normal.     Diabetic Foot Exam - Simple   No data filed      Lab Results  Component Value Date   WBC 9.7 11/27/2023   HGB 14.2 11/27/2023   HCT 45.1 11/27/2023   PLT 296 11/27/2023   GLUCOSE 80 11/27/2023   CHOL 146 11/27/2023   TRIG 144 11/27/2023   HDL 49 11/27/2023   LDLCALC 72 11/27/2023   ALT 29 11/27/2023   AST 33 11/27/2023   NA 143 11/27/2023   K 4.4 11/27/2023   CL 106 11/27/2023   CREATININE 0.92 11/27/2023   BUN 17 11/27/2023   CO2 21 11/27/2023   TSH 1.220 08/28/2023   HGBA1C 5.6 11/27/2023      Assessment & Plan:    Mixed hyperlipidemia Assessment & Plan: Well controlled.  No changes to medicines. Continue crestor 40 mg once daily. Continue to work on eating a healthy diet and exercise.  Labs ordered    Essential hypertension, benign Assessment & Plan: Well-controlled on Norvasc, losartan, isosorbide, and Crestor. Recent labs show normal liver and kidney function. -Continue current medications.   Prediabetes Assessment & Plan: -Recent labs show A1C of 5.6, indicating good control of prediabetes.  -Recommend continue to work on eating healthy diet and exercise.    Chronic respiratory failure with hypoxia (HCC) Assessment & Plan: Stable on 3 liters of O2. No acute respiratory symptoms reported. -Continue current management plan.   OSA on CPAP Assessment & Plan: Managed with CPAP. -Continue current management plan.   Mild recurrent major depression (HCC) Assessment & Plan: Managed with sertraline 1.5 tablets daily. -Continue current medication.     Assessment & Plan Postoperative Abdominal Pain Chronic right lower abdominal pain following  hernia repair. No acute symptoms such as fever, chills, or sweats. Pain is not relieved by bowel movements. Patient is not interested in exploratory surgery. -Consider over-the-counter pain relief such as Tylenol as needed.  Constipation Occasional constipation managed with as-needed prescription medication from the [place]. -Continue current management plan.  Chronic Obstructive Pulmonary Disease (COPD) Stable on 3 liters of O2. No acute respiratory symptoms reported. -Continue current management plan.  Hypertension and Hyperlipidemia Well-controlled on Norvasc, losartan, isosorbide, and Crestor. Recent labs show normal liver and kidney function. -Continue current medications.  Depression P  Sleep Apnea Managed with CPAP. -Continue current management plan.  General Health Maintenance -Received COVID-19 booster at the [place]. -Next follow-up in 6 months unless problems arise. -Recent labs show A1C of 5.6, indicating good control of prediabetes.   No orders of the defined types were placed in this encounter.   No orders of the defined types were placed in this encounter.  Follow-up: Return in about 6 months (around 05/31/2024) for chronic follow up.   I,Marla I Leal-Borjas,acting as a scribe for Blane Ohara, MD.,have documented all relevant documentation on the behalf of Blane Ohara, MD,as directed by  Blane Ohara, MD while in the presence of Blane Ohara, MD.   An After Visit Summary was printed and given to the patient.  I attest that I have reviewed this visit and agree with the plan scribed by my staff.   Blane Ohara, MD [facility] [phone]

## 2023-12-01 ENCOUNTER — Encounter: Payer: Self-pay | Admitting: Family Medicine

## 2023-12-01 ENCOUNTER — Ambulatory Visit: Payer: HMO | Admitting: Family Medicine

## 2023-12-01 VITALS — BP 132/82 | HR 73 | Temp 97.6°F | Ht 68.0 in | Wt 172.0 lb

## 2023-12-01 DIAGNOSIS — G4733 Obstructive sleep apnea (adult) (pediatric): Secondary | ICD-10-CM | POA: Diagnosis not present

## 2023-12-01 DIAGNOSIS — J9611 Chronic respiratory failure with hypoxia: Secondary | ICD-10-CM

## 2023-12-01 DIAGNOSIS — I10 Essential (primary) hypertension: Secondary | ICD-10-CM

## 2023-12-01 DIAGNOSIS — R7303 Prediabetes: Secondary | ICD-10-CM

## 2023-12-01 DIAGNOSIS — F330 Major depressive disorder, recurrent, mild: Secondary | ICD-10-CM

## 2023-12-01 DIAGNOSIS — R109 Unspecified abdominal pain: Secondary | ICD-10-CM

## 2023-12-01 DIAGNOSIS — G8918 Other acute postprocedural pain: Secondary | ICD-10-CM | POA: Diagnosis not present

## 2023-12-01 DIAGNOSIS — E782 Mixed hyperlipidemia: Secondary | ICD-10-CM | POA: Diagnosis not present

## 2023-12-01 DIAGNOSIS — R1031 Right lower quadrant pain: Secondary | ICD-10-CM | POA: Diagnosis not present

## 2023-12-05 DIAGNOSIS — R7303 Prediabetes: Secondary | ICD-10-CM | POA: Insufficient documentation

## 2023-12-05 DIAGNOSIS — G8918 Other acute postprocedural pain: Secondary | ICD-10-CM | POA: Insufficient documentation

## 2023-12-05 NOTE — Assessment & Plan Note (Signed)
Managed with sertraline 1.5 tablets daily. -Continue current medication.

## 2023-12-05 NOTE — Assessment & Plan Note (Signed)
Well controlled.  No changes to medicines. Continue crestor 40 mg once daily. Continue to work on eating a healthy diet and exercise.  Labs ordered

## 2023-12-05 NOTE — Assessment & Plan Note (Signed)
Chronic right lower abdominal pain following hernia repair. No acute symptoms such as fever, chills, or sweats. Pain is not relieved by bowel movements. Patient is not interested in exploratory surgery. -Consider over-the-counter pain relief such as Tylenol as needed.

## 2023-12-05 NOTE — Assessment & Plan Note (Signed)
Well-controlled on Norvasc, losartan, isosorbide, and Crestor. Recent labs show normal liver and kidney function. -Continue current medications.

## 2023-12-05 NOTE — Assessment & Plan Note (Addendum)
-  Recent labs show A1C of 5.6, indicating good control of prediabetes.  -Recommend continue to work on eating healthy diet and exercise.

## 2023-12-05 NOTE — Assessment & Plan Note (Signed)
Managed with CPAP. -Continue current management plan.

## 2023-12-05 NOTE — Assessment & Plan Note (Signed)
Stable on 3 liters of O2. No acute respiratory symptoms reported. -Continue current management plan.

## 2023-12-06 DIAGNOSIS — R1031 Right lower quadrant pain: Secondary | ICD-10-CM | POA: Insufficient documentation

## 2023-12-06 NOTE — Assessment & Plan Note (Signed)
Chronic right lower abdominal pain following hernia repair. No acute symptoms such as fever, chills, or sweats. Pain is not relieved by bowel movements. Patient is not interested in exploratory surgery. -Consider over-the-counter pain relief such as Tylenol as needed.

## 2023-12-09 ENCOUNTER — Telehealth: Payer: Self-pay

## 2023-12-09 NOTE — Telephone Encounter (Signed)
Copied from CRM [id number]. Topic: Clinical - Home Health Verbal Orders >> Dec 09, 2023 10:52 AM Joanette Gula wrote: Caller/Agency: Deanna/ [facility] Callback Number: [phone] Service Requested: Skilled Nursing Frequency: Per Deanna, Nursing is no longer needed.  Any new concerns about the patient? Yes

## 2023-12-09 NOTE — Telephone Encounter (Signed)
Called spoke to [facility] and Patient will be discharged from nursing but will continue physical therapy.

## 2024-03-22 DIAGNOSIS — R079 Chest pain, unspecified: Secondary | ICD-10-CM | POA: Diagnosis not present

## 2024-03-22 DIAGNOSIS — M25519 Pain in unspecified shoulder: Secondary | ICD-10-CM | POA: Diagnosis not present

## 2024-03-22 DIAGNOSIS — M545 Low back pain, unspecified: Secondary | ICD-10-CM | POA: Diagnosis not present

## 2024-03-27 DIAGNOSIS — S8252XD Displaced fracture of medial malleolus of left tibia, subsequent encounter for closed fracture with routine healing: Secondary | ICD-10-CM | POA: Diagnosis not present

## 2024-03-27 DIAGNOSIS — M6250 Muscle wasting and atrophy, not elsewhere classified, unspecified site: Secondary | ICD-10-CM | POA: Diagnosis not present

## 2024-03-27 DIAGNOSIS — J9611 Chronic respiratory failure with hypoxia: Secondary | ICD-10-CM | POA: Diagnosis not present

## 2024-03-27 DIAGNOSIS — S2243XS Multiple fractures of ribs, bilateral, sequela: Secondary | ICD-10-CM | POA: Diagnosis not present

## 2024-03-27 DIAGNOSIS — Z7902 Long term (current) use of antithrombotics/antiplatelets: Secondary | ICD-10-CM | POA: Diagnosis not present

## 2024-03-27 DIAGNOSIS — S2222XA Fracture of body of sternum, initial encounter for closed fracture: Secondary | ICD-10-CM | POA: Diagnosis not present

## 2024-03-27 DIAGNOSIS — K219 Gastro-esophageal reflux disease without esophagitis: Secondary | ICD-10-CM | POA: Diagnosis not present

## 2024-03-27 DIAGNOSIS — I10 Essential (primary) hypertension: Secondary | ICD-10-CM | POA: Diagnosis not present

## 2024-03-27 DIAGNOSIS — Z9981 Dependence on supplemental oxygen: Secondary | ICD-10-CM | POA: Diagnosis not present

## 2024-03-27 DIAGNOSIS — G4733 Obstructive sleep apnea (adult) (pediatric): Secondary | ICD-10-CM | POA: Diagnosis not present

## 2024-03-27 DIAGNOSIS — M549 Dorsalgia, unspecified: Secondary | ICD-10-CM | POA: Diagnosis not present

## 2024-03-27 DIAGNOSIS — F419 Anxiety disorder, unspecified: Secondary | ICD-10-CM | POA: Diagnosis not present

## 2024-03-27 DIAGNOSIS — I2510 Atherosclerotic heart disease of native coronary artery without angina pectoris: Secondary | ICD-10-CM | POA: Diagnosis not present

## 2024-03-27 DIAGNOSIS — S27322D Contusion of lung, bilateral, subsequent encounter: Secondary | ICD-10-CM | POA: Diagnosis not present

## 2024-03-27 DIAGNOSIS — F32A Depression, unspecified: Secondary | ICD-10-CM | POA: Diagnosis not present

## 2024-03-27 DIAGNOSIS — E785 Hyperlipidemia, unspecified: Secondary | ICD-10-CM | POA: Diagnosis not present

## 2024-03-27 DIAGNOSIS — J449 Chronic obstructive pulmonary disease, unspecified: Secondary | ICD-10-CM | POA: Diagnosis not present

## 2024-03-27 DIAGNOSIS — I7143 Infrarenal abdominal aortic aneurysm, without rupture: Secondary | ICD-10-CM | POA: Diagnosis not present

## 2024-03-27 DIAGNOSIS — R278 Other lack of coordination: Secondary | ICD-10-CM | POA: Diagnosis not present

## 2024-03-27 DIAGNOSIS — I739 Peripheral vascular disease, unspecified: Secondary | ICD-10-CM | POA: Diagnosis not present

## 2024-03-27 DIAGNOSIS — G8929 Other chronic pain: Secondary | ICD-10-CM | POA: Diagnosis not present

## 2024-03-27 DIAGNOSIS — M62838 Other muscle spasm: Secondary | ICD-10-CM | POA: Diagnosis not present

## 2024-03-29 LAB — LAB REPORT - SCANNED: EGFR: 88

## 2024-04-07 DIAGNOSIS — J449 Chronic obstructive pulmonary disease, unspecified: Secondary | ICD-10-CM | POA: Insufficient documentation

## 2024-04-07 DIAGNOSIS — W19XXXD Unspecified fall, subsequent encounter: Secondary | ICD-10-CM | POA: Diagnosis not present

## 2024-04-07 DIAGNOSIS — E785 Hyperlipidemia, unspecified: Secondary | ICD-10-CM | POA: Diagnosis not present

## 2024-04-07 DIAGNOSIS — J9611 Chronic respiratory failure with hypoxia: Secondary | ICD-10-CM | POA: Diagnosis not present

## 2024-04-07 DIAGNOSIS — I89 Other noninfective disorders of lymphatic vessels and lymph nodes: Secondary | ICD-10-CM | POA: Diagnosis not present

## 2024-04-07 DIAGNOSIS — I5033 Acute on chronic diastolic (congestive) heart failure: Secondary | ICD-10-CM | POA: Diagnosis not present

## 2024-04-07 DIAGNOSIS — S2242XD Multiple fractures of ribs, left side, subsequent encounter for fracture with routine healing: Secondary | ICD-10-CM | POA: Diagnosis not present

## 2024-04-07 DIAGNOSIS — S2231XD Fracture of one rib, right side, subsequent encounter for fracture with routine healing: Secondary | ICD-10-CM | POA: Diagnosis not present

## 2024-04-07 DIAGNOSIS — R2681 Unsteadiness on feet: Secondary | ICD-10-CM | POA: Diagnosis not present

## 2024-04-07 DIAGNOSIS — S8265XD Nondisplaced fracture of lateral malleolus of left fibula, subsequent encounter for closed fracture with routine healing: Secondary | ICD-10-CM | POA: Diagnosis not present

## 2024-04-07 DIAGNOSIS — S2241XD Multiple fractures of ribs, right side, subsequent encounter for fracture with routine healing: Secondary | ICD-10-CM | POA: Diagnosis not present

## 2024-04-07 DIAGNOSIS — I25118 Atherosclerotic heart disease of native coronary artery with other forms of angina pectoris: Secondary | ICD-10-CM | POA: Diagnosis not present

## 2024-04-07 DIAGNOSIS — E440 Moderate protein-calorie malnutrition: Secondary | ICD-10-CM | POA: Diagnosis not present

## 2024-04-07 DIAGNOSIS — J9621 Acute and chronic respiratory failure with hypoxia: Secondary | ICD-10-CM | POA: Diagnosis not present

## 2024-04-07 DIAGNOSIS — F419 Anxiety disorder, unspecified: Secondary | ICD-10-CM | POA: Diagnosis not present

## 2024-04-07 DIAGNOSIS — I10 Essential (primary) hypertension: Secondary | ICD-10-CM | POA: Diagnosis not present

## 2024-04-07 DIAGNOSIS — R262 Difficulty in walking, not elsewhere classified: Secondary | ICD-10-CM | POA: Diagnosis not present

## 2024-04-07 DIAGNOSIS — I739 Peripheral vascular disease, unspecified: Secondary | ICD-10-CM | POA: Diagnosis not present

## 2024-04-07 DIAGNOSIS — I2510 Atherosclerotic heart disease of native coronary artery without angina pectoris: Secondary | ICD-10-CM | POA: Diagnosis not present

## 2024-04-07 DIAGNOSIS — F32A Depression, unspecified: Secondary | ICD-10-CM | POA: Diagnosis not present

## 2024-04-07 DIAGNOSIS — F5104 Psychophysiologic insomnia: Secondary | ICD-10-CM | POA: Diagnosis not present

## 2024-04-09 DIAGNOSIS — S2241XD Multiple fractures of ribs, right side, subsequent encounter for fracture with routine healing: Secondary | ICD-10-CM | POA: Diagnosis not present

## 2024-04-09 DIAGNOSIS — R262 Difficulty in walking, not elsewhere classified: Secondary | ICD-10-CM | POA: Diagnosis not present

## 2024-04-09 DIAGNOSIS — I2510 Atherosclerotic heart disease of native coronary artery without angina pectoris: Secondary | ICD-10-CM | POA: Diagnosis not present

## 2024-04-09 DIAGNOSIS — J9621 Acute and chronic respiratory failure with hypoxia: Secondary | ICD-10-CM | POA: Diagnosis not present

## 2024-04-22 DIAGNOSIS — M5416 Radiculopathy, lumbar region: Secondary | ICD-10-CM | POA: Diagnosis not present

## 2024-04-22 DIAGNOSIS — E440 Moderate protein-calorie malnutrition: Secondary | ICD-10-CM | POA: Diagnosis not present

## 2024-04-22 DIAGNOSIS — J9621 Acute and chronic respiratory failure with hypoxia: Secondary | ICD-10-CM | POA: Diagnosis not present

## 2024-04-22 DIAGNOSIS — F330 Major depressive disorder, recurrent, mild: Secondary | ICD-10-CM | POA: Diagnosis not present

## 2024-04-22 DIAGNOSIS — M150 Primary generalized (osteo)arthritis: Secondary | ICD-10-CM | POA: Diagnosis not present

## 2024-04-22 DIAGNOSIS — I340 Nonrheumatic mitral (valve) insufficiency: Secondary | ICD-10-CM | POA: Diagnosis not present

## 2024-04-22 DIAGNOSIS — G4733 Obstructive sleep apnea (adult) (pediatric): Secondary | ICD-10-CM | POA: Diagnosis not present

## 2024-04-22 DIAGNOSIS — J690 Pneumonitis due to inhalation of food and vomit: Secondary | ICD-10-CM | POA: Diagnosis not present

## 2024-04-22 DIAGNOSIS — M48062 Spinal stenosis, lumbar region with neurogenic claudication: Secondary | ICD-10-CM | POA: Diagnosis not present

## 2024-04-22 DIAGNOSIS — I700 Atherosclerosis of aorta: Secondary | ICD-10-CM | POA: Diagnosis not present

## 2024-04-22 DIAGNOSIS — J439 Emphysema, unspecified: Secondary | ICD-10-CM | POA: Diagnosis not present

## 2024-04-22 DIAGNOSIS — M47812 Spondylosis without myelopathy or radiculopathy, cervical region: Secondary | ICD-10-CM | POA: Diagnosis not present

## 2024-04-22 DIAGNOSIS — I25118 Atherosclerotic heart disease of native coronary artery with other forms of angina pectoris: Secondary | ICD-10-CM | POA: Diagnosis not present

## 2024-04-22 DIAGNOSIS — J449 Chronic obstructive pulmonary disease, unspecified: Secondary | ICD-10-CM | POA: Diagnosis not present

## 2024-04-22 DIAGNOSIS — F5101 Primary insomnia: Secondary | ICD-10-CM | POA: Diagnosis not present

## 2024-04-22 DIAGNOSIS — I739 Peripheral vascular disease, unspecified: Secondary | ICD-10-CM | POA: Diagnosis not present

## 2024-04-22 DIAGNOSIS — F419 Anxiety disorder, unspecified: Secondary | ICD-10-CM | POA: Diagnosis not present

## 2024-04-22 DIAGNOSIS — M80062D Age-related osteoporosis with current pathological fracture, left lower leg, subsequent encounter for fracture with routine healing: Secondary | ICD-10-CM | POA: Diagnosis not present

## 2024-04-22 DIAGNOSIS — M800AXD Age-related osteoporosis with current pathological fracture, other site, subsequent encounter for fracture with routine healing: Secondary | ICD-10-CM | POA: Diagnosis not present

## 2024-04-22 DIAGNOSIS — M519 Unspecified thoracic, thoracolumbar and lumbosacral intervertebral disc disorder: Secondary | ICD-10-CM | POA: Diagnosis not present

## 2024-04-22 DIAGNOSIS — M8008XD Age-related osteoporosis with current pathological fracture, vertebra(e), subsequent encounter for fracture with routine healing: Secondary | ICD-10-CM | POA: Diagnosis not present

## 2024-04-22 DIAGNOSIS — I5033 Acute on chronic diastolic (congestive) heart failure: Secondary | ICD-10-CM | POA: Diagnosis not present

## 2024-04-22 DIAGNOSIS — E782 Mixed hyperlipidemia: Secondary | ICD-10-CM | POA: Diagnosis not present

## 2024-04-22 DIAGNOSIS — I110 Hypertensive heart disease with heart failure: Secondary | ICD-10-CM | POA: Diagnosis not present

## 2024-04-23 ENCOUNTER — Telehealth: Payer: Self-pay | Admitting: Family Medicine

## 2024-04-23 NOTE — Telephone Encounter (Signed)
 [facility] Plan of Care
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
## 2024-04-26 ENCOUNTER — Encounter: Payer: Self-pay | Admitting: Family Medicine

## 2024-04-26 ENCOUNTER — Ambulatory Visit: Admitting: Family Medicine

## 2024-04-26 ENCOUNTER — Other Ambulatory Visit: Payer: Self-pay

## 2024-04-26 ENCOUNTER — Other Ambulatory Visit: Payer: Self-pay | Admitting: Family Medicine

## 2024-04-26 VITALS — BP 112/64 | HR 69 | Temp 97.7°F | Ht 68.0 in

## 2024-04-26 DIAGNOSIS — K219 Gastro-esophageal reflux disease without esophagitis: Secondary | ICD-10-CM | POA: Diagnosis not present

## 2024-04-26 DIAGNOSIS — F330 Major depressive disorder, recurrent, mild: Secondary | ICD-10-CM | POA: Diagnosis not present

## 2024-04-26 DIAGNOSIS — S82892A Other fracture of left lower leg, initial encounter for closed fracture: Secondary | ICD-10-CM

## 2024-04-26 DIAGNOSIS — I25118 Atherosclerotic heart disease of native coronary artery with other forms of angina pectoris: Secondary | ICD-10-CM

## 2024-04-26 DIAGNOSIS — Z9981 Dependence on supplemental oxygen: Secondary | ICD-10-CM

## 2024-04-26 DIAGNOSIS — I10 Essential (primary) hypertension: Secondary | ICD-10-CM

## 2024-04-26 DIAGNOSIS — E782 Mixed hyperlipidemia: Secondary | ICD-10-CM

## 2024-04-26 DIAGNOSIS — I700 Atherosclerosis of aorta: Secondary | ICD-10-CM

## 2024-04-26 DIAGNOSIS — J410 Simple chronic bronchitis: Secondary | ICD-10-CM | POA: Diagnosis not present

## 2024-04-26 DIAGNOSIS — J9611 Chronic respiratory failure with hypoxia: Secondary | ICD-10-CM

## 2024-04-26 NOTE — Patient Instructions (Signed)
 Go to [facility] for xray.

## 2024-04-26 NOTE — Telephone Encounter (Signed)
 Verbal was given to [facility] from [facility] for Nursing 1x a week for 4 weeks.

## 2024-04-26 NOTE — Telephone Encounter (Signed)
 Spoke with William Clay and stated the below was okay.   Copied from CRM [id number]. Topic: Clinical - Home Health Verbal Orders >> Apr 26, 2024  3:55 PM Baldemar Lev wrote: Caller/Agency: William Clay from [facility]  Callback Number: [phone] Service Requested: Physical Therapy Frequency:   2w4  1w4  For home exercises, transfers, gait training, safety education  Any new concerns about the patient? No

## 2024-04-27 ENCOUNTER — Encounter: Payer: Self-pay | Admitting: Family Medicine

## 2024-04-27 ENCOUNTER — Ambulatory Visit (INDEPENDENT_AMBULATORY_CARE_PROVIDER_SITE_OTHER)
Admission: RE | Admit: 2024-04-27 | Discharge: 2024-04-27 | Disposition: A | Discharge status: Home / Self Care | Source: Ambulatory Visit | Attending: Family Medicine | Admitting: Family Medicine

## 2024-04-27 DIAGNOSIS — S82892A Other fracture of left lower leg, initial encounter for closed fracture: Secondary | ICD-10-CM | POA: Diagnosis not present

## 2024-04-27 DIAGNOSIS — M7732 Calcaneal spur, left foot: Secondary | ICD-10-CM | POA: Diagnosis not present

## 2024-04-27 DIAGNOSIS — M7662 Achilles tendinitis, left leg: Secondary | ICD-10-CM | POA: Diagnosis not present

## 2024-04-27 DIAGNOSIS — S8252XA Displaced fracture of medial malleolus of left tibia, initial encounter for closed fracture: Secondary | ICD-10-CM | POA: Diagnosis not present

## 2024-04-27 LAB — COMPREHENSIVE METABOLIC PANEL WITH GFR
ALT: 22 IU/L (ref 0–44)
AST: 30 IU/L (ref 0–40)
Albumin: 4.2 g/dL (ref 3.8–4.8)
Alkaline Phosphatase: 146 IU/L — ABNORMAL HIGH (ref 44–121)
BUN/Creatinine Ratio: 13 (ref 10–24)
BUN: 12 mg/dL (ref 8–27)
Bilirubin Total: 0.5 mg/dL (ref 0.0–1.2)
CO2: 19 mmol/L — ABNORMAL LOW (ref 20–29)
Calcium: 9.5 mg/dL (ref 8.6–10.2)
Chloride: 109 mmol/L — ABNORMAL HIGH (ref 96–106)
Creatinine, Ser: 0.95 mg/dL (ref 0.76–1.27)
Globulin, Total: 2.3 g/dL (ref 1.5–4.5)
Glucose: 82 mg/dL (ref 70–99)
Potassium: 4.8 mmol/L (ref 3.5–5.2)
Sodium: 144 mmol/L (ref 134–144)
Total Protein: 6.5 g/dL (ref 6.0–8.5)
eGFR: 81 mL/min/{1.73_m2} (ref >59)

## 2024-04-27 LAB — CBC WITH DIFFERENTIAL/PLATELET
Basophils Absolute: 0.1 10*3/uL (ref 0.0–0.2)
Basos: 1 %
EOS (ABSOLUTE): 0.3 10*3/uL (ref 0.0–0.4)
Eos: 3 %
Hematocrit: 40 % (ref 37.5–51.0)
Hemoglobin: 13 g/dL (ref 13.0–17.7)
Immature Grans (Abs): 0.1 10*3/uL (ref 0.0–0.1)
Immature Granulocytes: 1 %
Lymphocytes Absolute: 1 10*3/uL (ref 0.7–3.1)
Lymphs: 10 %
MCH: 31.7 pg (ref 26.6–33.0)
MCHC: 32.5 g/dL (ref 31.5–35.7)
MCV: 98 fL — ABNORMAL HIGH (ref 79–97)
Monocytes Absolute: 1.1 10*3/uL — ABNORMAL HIGH (ref 0.1–0.9)
Monocytes: 11 %
Neutrophils Absolute: 7.5 10*3/uL — ABNORMAL HIGH (ref 1.4–7.0)
Neutrophils: 74 %
Platelets: 301 10*3/uL (ref 150–450)
RBC: 4.1 x10E6/uL — ABNORMAL LOW (ref 4.14–5.80)
RDW: 14.2 % (ref 11.6–15.4)
WBC: 10 10*3/uL (ref 3.4–10.8)

## 2024-04-27 LAB — LIPID PANEL
Chol/HDL Ratio: 2.8 ratio (ref 0.0–5.0)
Cholesterol, Total: 122 mg/dL (ref 100–199)
HDL: 43 mg/dL (ref >39)
LDL Chol Calc (NIH): 55 mg/dL (ref 0–99)
Triglycerides: 134 mg/dL (ref 0–149)
VLDL Cholesterol Cal: 24 mg/dL (ref 5–40)

## 2024-04-28 DIAGNOSIS — S82892A Other fracture of left lower leg, initial encounter for closed fracture: Secondary | ICD-10-CM | POA: Insufficient documentation

## 2024-04-28 NOTE — Assessment & Plan Note (Addendum)
 Go to [facility] for xray.

## 2024-04-28 NOTE — Assessment & Plan Note (Addendum)
 The current medical regimen is effective; continue present plan and medications. Continue plavix, aspirin, crestor , amlodopine and imdur.  Recommend continue to work on eating healthy diet.

## 2024-04-28 NOTE — Assessment & Plan Note (Addendum)
 The current medical regimen is effective; continue present plan and medications.  Continue crestor , plavix, and aspirin.

## 2024-04-28 NOTE — Assessment & Plan Note (Addendum)
 The current medical regimen is effective; continue present plan and medications.  Continue imdur and amlodipine .

## 2024-04-28 NOTE — Assessment & Plan Note (Addendum)
The current medical regimen is effective;  continue present plan and medications. Continue pantoprazole 40 mg twice daily.  

## 2024-04-29 ENCOUNTER — Telehealth: Payer: Self-pay

## 2024-04-29 NOTE — Telephone Encounter (Signed)
 Patient called stating he has been speaking with Alica Antu with [employer] about a portable concentrator. States a order needs to be sent to them. Phone: [phone] Fax: [phone]  Spoke with Fawn Hooks at [facility] who stated they only need a rx or a office note stating the patient needs O2 faxed over and that is sufficient by law.

## 2024-05-02 DIAGNOSIS — J410 Simple chronic bronchitis: Secondary | ICD-10-CM | POA: Insufficient documentation

## 2024-05-02 MED ORDER — PANTOPRAZOLE SODIUM 40 MG PO TBEC: 40.0000 mg | DELAYED_RELEASE_TABLET | Freq: Two times a day (BID) | ORAL | 3 refills | Status: AC

## 2024-05-02 NOTE — Assessment & Plan Note (Signed)
 On 3 L continuous oxygen .

## 2024-05-02 NOTE — Assessment & Plan Note (Signed)
 Order oxygen  at 3 L.  Order a portable compressor.

## 2024-05-02 NOTE — Assessment & Plan Note (Signed)
 Managed with sertraline 1.5 tablets daily. -Continue current medication.

## 2024-05-02 NOTE — Assessment & Plan Note (Signed)
Well controlled.  No changes to medicines. Continue crestor 40 mg once daily. Continue to work on eating a healthy diet and exercise.  Labs ordered

## 2024-05-02 NOTE — Assessment & Plan Note (Signed)
 Continue current pulmonary medicines (performist), stiolto and albuterol  nebulizers.

## 2024-05-03 NOTE — Telephone Encounter (Signed)
 Note faxed.

## 2024-05-05 ENCOUNTER — Telehealth: Payer: Self-pay | Admitting: Family Medicine

## 2024-05-05 NOTE — Telephone Encounter (Signed)
 [facility] HOME HEALTH - PLAN OF CARE - [facility] DATE 04/22/24

## 2024-05-06 ENCOUNTER — Ambulatory Visit: Payer: Self-pay | Admitting: Family Medicine

## 2024-05-07 DIAGNOSIS — E440 Moderate protein-calorie malnutrition: Secondary | ICD-10-CM

## 2024-05-07 DIAGNOSIS — M80062D Age-related osteoporosis with current pathological fracture, left lower leg, subsequent encounter for fracture with routine healing: Secondary | ICD-10-CM

## 2024-05-07 DIAGNOSIS — M8008XD Age-related osteoporosis with current pathological fracture, vertebra(e), subsequent encounter for fracture with routine healing: Secondary | ICD-10-CM

## 2024-05-07 DIAGNOSIS — M800AXD Age-related osteoporosis with current pathological fracture, other site, subsequent encounter for fracture with routine healing: Secondary | ICD-10-CM

## 2024-05-07 DIAGNOSIS — I110 Hypertensive heart disease with heart failure: Secondary | ICD-10-CM

## 2024-05-07 DIAGNOSIS — J690 Pneumonitis due to inhalation of food and vomit: Secondary | ICD-10-CM

## 2024-05-07 DIAGNOSIS — I739 Peripheral vascular disease, unspecified: Secondary | ICD-10-CM

## 2024-05-07 DIAGNOSIS — J439 Emphysema, unspecified: Secondary | ICD-10-CM

## 2024-05-07 DIAGNOSIS — I700 Atherosclerosis of aorta: Secondary | ICD-10-CM

## 2024-05-07 DIAGNOSIS — I5033 Acute on chronic diastolic (congestive) heart failure: Secondary | ICD-10-CM

## 2024-05-07 DIAGNOSIS — F330 Major depressive disorder, recurrent, mild: Secondary | ICD-10-CM

## 2024-05-07 DIAGNOSIS — J9621 Acute and chronic respiratory failure with hypoxia: Secondary | ICD-10-CM

## 2024-05-10 DIAGNOSIS — G894 Chronic pain syndrome: Secondary | ICD-10-CM | POA: Diagnosis not present

## 2024-05-21 ENCOUNTER — Telehealth (HOSPITAL_BASED_OUTPATIENT_CLINIC_OR_DEPARTMENT_OTHER): Payer: Self-pay

## 2024-05-21 NOTE — Telephone Encounter (Signed)
 Copied from CRM [id number]. Topic: General - Other >> May 21, 2024  3:39 PM Emylou G wrote: Reason for CRM: Cybill w/[facility] [phone] Patient fell .. sitting on his walker and he was trying to get up - the walker has the seat on it... parademics came, but he declined going.. but didn't need stitches.. injured back of head - relaying info

## 2024-05-24 ENCOUNTER — Telehealth: Payer: Self-pay | Admitting: Family Medicine

## 2024-05-24 NOTE — Telephone Encounter (Signed)
 [facility] Supplemental Orders from 05/05/24 to 05/13/24.

## 2024-05-24 NOTE — Telephone Encounter (Signed)
 Left message for patient to return call.

## 2024-05-24 NOTE — Telephone Encounter (Signed)
 Spoke with patient, he stated he is going to the [place] tomorrow and will let them elevate him so he doesn't have to make multiple trips.

## 2024-05-24 NOTE — Telephone Encounter (Signed)
I left message on voicemail to call us back and set up an appointment. 

## 2024-05-27 ENCOUNTER — Other Ambulatory Visit: Payer: HMO

## 2024-05-27 ENCOUNTER — Other Ambulatory Visit

## 2024-05-31 ENCOUNTER — Ambulatory Visit: Payer: HMO | Admitting: Family Medicine

## 2024-07-30 ENCOUNTER — Other Ambulatory Visit

## 2024-07-30 DIAGNOSIS — I25118 Atherosclerotic heart disease of native coronary artery with other forms of angina pectoris: Secondary | ICD-10-CM

## 2024-07-30 DIAGNOSIS — I10 Essential (primary) hypertension: Secondary | ICD-10-CM | POA: Diagnosis not present

## 2024-07-31 ENCOUNTER — Ambulatory Visit: Payer: Self-pay | Admitting: Family Medicine

## 2024-07-31 LAB — CBC WITH DIFFERENTIAL/PLATELET
Basophils Absolute: 0.1 x10E3/uL (ref 0.0–0.2)
Basos: 1 %
EOS (ABSOLUTE): 0.3 x10E3/uL (ref 0.0–0.4)
Eos: 4 %
Hematocrit: 41.8 % (ref 37.5–51.0)
Hemoglobin: 13.5 g/dL (ref 13.0–17.7)
Immature Grans (Abs): 0 x10E3/uL (ref 0.0–0.1)
Immature Granulocytes: 0 %
Lymphocytes Absolute: 0.8 x10E3/uL (ref 0.7–3.1)
Lymphs: 9 %
MCH: 30.8 pg (ref 26.6–33.0)
MCHC: 32.3 g/dL (ref 31.5–35.7)
MCV: 95 fL (ref 79–97)
Monocytes Absolute: 0.9 x10E3/uL (ref 0.1–0.9)
Monocytes: 10 %
Neutrophils Absolute: 6.8 x10E3/uL (ref 1.4–7.0)
Neutrophils: 76 %
Platelets: 357 x10E3/uL (ref 150–450)
RBC: 4.39 x10E6/uL (ref 4.14–5.80)
RDW: 14.9 % (ref 11.6–15.4)
WBC: 8.9 x10E3/uL (ref 3.4–10.8)

## 2024-07-31 LAB — COMPREHENSIVE METABOLIC PANEL WITH GFR
ALT: 21 IU/L (ref 0–44)
AST: 32 IU/L (ref 0–40)
Albumin: 4.2 g/dL (ref 3.8–4.8)
Alkaline Phosphatase: 106 IU/L (ref 44–121)
BUN/Creatinine Ratio: 14 (ref 10–24)
BUN: 14 mg/dL (ref 8–27)
Bilirubin Total: 0.4 mg/dL (ref 0.0–1.2)
CO2: 20 mmol/L (ref 20–29)
Calcium: 9.8 mg/dL (ref 8.6–10.2)
Chloride: 106 mmol/L (ref 96–106)
Creatinine, Ser: 0.97 mg/dL (ref 0.76–1.27)
Globulin, Total: 2.4 g/dL (ref 1.5–4.5)
Glucose: 77 mg/dL (ref 70–99)
Potassium: 4.1 mmol/L (ref 3.5–5.2)
Sodium: 142 mmol/L (ref 134–144)
Total Protein: 6.6 g/dL (ref 6.0–8.5)
eGFR: 79 mL/min/1.73 (ref >59)

## 2024-07-31 LAB — LIPID PANEL
Chol/HDL Ratio: 2.6 ratio (ref 0.0–5.0)
Cholesterol, Total: 117 mg/dL (ref 100–199)
HDL: 45 mg/dL (ref >39)
LDL Chol Calc (NIH): 55 mg/dL (ref 0–99)
Triglycerides: 84 mg/dL (ref 0–149)
VLDL Cholesterol Cal: 17 mg/dL (ref 5–40)

## 2024-08-03 ENCOUNTER — Encounter: Payer: Self-pay | Admitting: Family Medicine

## 2024-08-03 ENCOUNTER — Ambulatory Visit: Admitting: Family Medicine

## 2024-08-03 VITALS — BP 132/70 | HR 80 | Temp 98.0°F | Resp 22 | Ht 68.0 in | Wt 163.0 lb

## 2024-08-03 DIAGNOSIS — F330 Major depressive disorder, recurrent, mild: Secondary | ICD-10-CM

## 2024-08-03 DIAGNOSIS — J9611 Chronic respiratory failure with hypoxia: Secondary | ICD-10-CM | POA: Diagnosis not present

## 2024-08-03 DIAGNOSIS — E782 Mixed hyperlipidemia: Secondary | ICD-10-CM | POA: Diagnosis not present

## 2024-08-03 DIAGNOSIS — I10 Essential (primary) hypertension: Secondary | ICD-10-CM

## 2024-08-03 DIAGNOSIS — R270 Ataxia, unspecified: Secondary | ICD-10-CM | POA: Diagnosis not present

## 2024-08-03 DIAGNOSIS — I25118 Atherosclerotic heart disease of native coronary artery with other forms of angina pectoris: Secondary | ICD-10-CM

## 2024-08-03 MED ORDER — NITROGLYCERIN 0.4 MG SL SUBL: 0.4000 mg | SUBLINGUAL_TABLET | SUBLINGUAL | 1 refills | Status: AC | PRN

## 2024-08-03 NOTE — Assessment & Plan Note (Signed)
 The current medical regimen is effective; continue present plan and medications. Continue plavix, aspirin, crestor , amlodopine and imdur.  Recommend continue to work on eating healthy diet.

## 2024-08-03 NOTE — Assessment & Plan Note (Signed)
 Well controlled.  No changes to medicines. Continue crestor  40 mg once daily. Continue to work on eating a healthy diet and exercise.  Labs REVIEWED.

## 2024-08-03 NOTE — Assessment & Plan Note (Signed)
 Managed with sertraline  150 MG every day.  -Continue current medication.

## 2024-08-03 NOTE — Patient Instructions (Signed)
 VISIT SUMMARY:  Today, we addressed your right foot swelling, balance issues, and chronic respiratory symptoms. We also reviewed your current medications and discussed the need for a new nitroglycerin  prescription.  YOUR PLAN:  RIGHT FOOT AND LEG SWELLING: Swelling in your right foot and leg for three weeks. -Monitor the swelling and report any changes. -AVOID SALT AND ELEVATE FEET.   IMPAIRED BALANCE WITH RECURRENT FALLS: Balance issues causing falls over the past four months, possibly due to medication side effects and tripping over your oxygen  cord. -D[facility] balance issues and [facility]. -Consider an MRI of the brain if balance issues persist -DISCUSS WITH [facility]. CALL BACK IF IT DOES NOT GET ADDRESSED THIS WEEK BY THE [facility]. -Continue using assistive devices like your cane or walker.  CHRONIC RESPIRATORY FAILURE REQUIRING SUPPLEMENTAL OXYGEN  AND COPD: You require supplemental oxygen  and have not been using your prescribed inhaler or nebulizer. -Discuss inhaler and nebulizer use with your pulmonologist at your upcoming [facility] appointment. -Take all your medications to the [facility] appointment for review.  CORONARY ARTERY DISEASE: No recent chest pain. Your nitroglycerin  prescription has expired. -We will send your nitroglycerin  prescription to the [facility] for renewal.  HYPERTENSION: Your blood pressure is managed with amlodipine  and is not too low, ruling out hypotension as a cause for your balance issues. -Continue taking amlodipine  as prescribed.  HYPERLIPIDEMIA: Your cholesterol levels are fine and managed with rosuvastatin . -Continue taking rosuvastatin  as prescribed.  GASTROESOPHAGEAL REFLUX DISEASE (GERD): Managed with Protonix . -Continue taking Protonix  as prescribed.  ALLERGIC RHINITIS: Managed with loratadine and Flonase. Chlorpheniramine was discontinued due to sedative effects. -Continue taking loratadine and Flonase as prescribed.  DRY EYES: Managed  with eye drops and gel, but you report your eyes are still watering. -Continue using eye drops and gel as needed.  HEARING IMPAIRMENT: Difficulty understanding speech, but no change in hearing acuity reported. -Discuss any further concerns with the [facility].  DEPRESSION: Managed with Zoloft . You report feeling happy. -Continue taking Zoloft  as prescribed.

## 2024-08-03 NOTE — Assessment & Plan Note (Signed)
 Recurrent falls over four months. Possible medication side effects and tripping over oxygen  concentrator cord. Balance significantly off. - Discuss balance issues and potential need for [facility] with [facility]. - Consider MRI of the brain if balance issues persist. - Ensure use of assistive devices like cane or walker.

## 2024-08-03 NOTE — Progress Notes (Signed)
 Subjective:  Patient ID: William Clay, male    DOB: [birth year]  Age: 83 y.o. MRN: [id number]  Chief Complaint  Patient presents with   Medical Management of Chronic Issues    30M    Discussed the use of AI scribe software for clinical note transcription with the patient, who gave verbal consent to proceed.  History of Present Illness   William Clay is an 83 year old male with chronic lung disease who presents with right foot swelling and balance issues.  Peripheral edema - Right foot swelling present for approximately three weeks - Swelling more pronounced in the right leg compared to the left - No recent changes in diet - No new medications  Gait instability and falls - Balance issues resulting in four to five falls over the past four months - Falls often occur due to tripping over oxygen  cord or while outside - Minor injuries sustained, including brushing his head - Uses a cane or walker for mobility - In the process of obtaining a new scooter through the [place] - Not currently engaged in physical therapy; previous therapy was through the [place] - Some medications may cause sleepiness or dizziness - Able to get up by himself after a fall - Has a life alert system for emergencies  Chronic respiratory symptoms and oxygen  use - Chronic lung disease - Uses supplemental oxygen  at three liters during sleep and physical activity - Removes oxygen  at rest - Recently acquired a portable oxygen  unit - Breathing difficulties present - No chest pain - No recent use of nitroglycerin ; current supply likely expired  Ocular symptoms - History of cataract surgery - Occasional use of reading glasses - Dry eyes treated with eye drop and gel - Frequent tearing of the eyes      HTN: Norvasc  5 mg daily, Imudr 30 mg daily, Asa 81 and Plavix, Losartan  100 mg daily   Hyperlipidemia: Crestor  40 mg daily  CORONARY ARTERY DISEASE: ON PLAVIX, ASA, CRESTOR .    GERD: Protonix  40 mg daily     Anxiety/Depression: Sertraline150 mg daily.    Prediabetes: A1c 5.7   OSA with cpap and O2 3L   Chronic respiratory failure with hypoxia: uses 3 L oxygen  almost 24 hours per day.      08/03/2024    8:02 AM 12/01/2023    1:26 PM 08/28/2023    2:03 PM 06/11/2023    3:43 PM 05/22/2023   10:47 AM  Depression screen PHQ 2/9  Decreased Interest 0 0 0 1 1  Down, Depressed, Hopeless 0 0 0 0 0  PHQ - 2 Score 0 0 0 1 1  Altered sleeping  0  0 0  Tired, decreased energy  0  0 0  Change in appetite  0  0 0  Feeling bad or failure about yourself   0  0 0  Trouble concentrating  0  0 0  Moving slowly or fidgety/restless  0  0 0  Suicidal thoughts  0  0 0  PHQ-9 Score  0  1 1  Difficult doing work/chores  Not difficult at all  Not difficult at all Not difficult at all        08/03/2024    8:35 AM  Fall Risk   Falls in the past year? 1  Number falls in past yr: 1  Injury with Fall? 1  Risk for fall due to : Impaired balance/gait;History of fall(s)  Follow up Falls evaluation completed;Education provided;Falls  prevention discussed    Patient Care Team: Sherre Clapper, MD as PCP - General (Family Medicine) Evern Donnajean Kayser, MD as Referring Physician (Orthopedic Surgery) Nyle Rankin POUR, [facility] (Inactive) (Pharmacist)   Review of Systems  Constitutional:  Negative for chills, fatigue and fever.  HENT:  Negative for congestion, ear pain and sore throat.   Respiratory:  Negative for cough and shortness of breath.   Cardiovascular:  Negative for chest pain.  Gastrointestinal:  Negative for abdominal pain, constipation, diarrhea, nausea and vomiting.  Endocrine: Negative for polydipsia, polyphagia and polyuria.  Genitourinary:  Negative for dysuria and frequency.  Musculoskeletal:  Negative for arthralgias and myalgias.  Neurological:  Negative for dizziness and headaches.  Psychiatric/Behavioral:  Negative for dysphoric mood.        No dysphoria    Current Outpatient  Medications on File Prior to Visit  Medication Sig Dispense Refill   albuterol  (PROVENTIL  HFA;VENTOLIN  HFA) 108 (90 Base) MCG/ACT inhaler Inhale two puffs every four to six hours as needed for cough or wheeze. 1 Inhaler 1   amLODipine  (NORVASC ) 5 MG tablet Take 1 tablet by mouth daily.     aspirin EC 81 MG tablet Take 81 mg by mouth daily. Swallow whole.     Carboxymethylcellulose Sod PF 1 % GEL Apply 1 drop to eye.     clopidogrel (PLAVIX) 75 MG tablet Take 1 tablet by mouth daily.     fluticasone (FLONASE) 50 MCG/ACT nasal spray Place 2 sprays into both nostrils daily.     ipratropium (ATROVENT) 0.03 % nasal spray Place into the nose.     isosorbide mononitrate (IMDUR) 30 MG 24 hr tablet Take 30 mg by mouth at bedtime.     lactulose, encephalopathy, (CHRONULAC) 10 GM/15ML SOLN Take 30 g by mouth 2 (two) times daily.     loratadine (CLARITIN) 10 MG tablet Take 10 mg by mouth daily.     Multiple Vitamins-Minerals (MULTIVITAMIN WITH MINERALS) tablet Take 1 tablet by mouth daily.     pantoprazole  (PROTONIX ) 40 MG tablet Take 1 tablet (40 mg total) by mouth 2 (two) times daily before a meal. 180 tablet 3   rosuvastatin  (CRESTOR ) 40 MG tablet Take 1 tablet (40 mg total) by mouth at bedtime. 90 tablet 1   sertraline  (ZOLOFT ) 100 MG tablet TAKE 1 TABLET BY MOUTH ONCE DAILY (Patient taking differently: Take 150 mg by mouth daily.) 90 tablet 3   Tiotropium Bromide-Olodaterol 2.5-2.5 MCG/ACT AERS Inhale 2 puffs into the lungs daily.      No current facility-administered medications on file prior to visit.   Past Medical History:  Diagnosis Date   Atherosclerosis of aorta (HCC)    Bowel obstruction (HCC) 2018   BPH (benign prostatic hyperplasia)    COPD (chronic obstructive pulmonary disease) (HCC)    COPD (chronic obstructive pulmonary disease) (HCC)    GERD (gastroesophageal reflux disease)    Hypertension    Hypoxemia    Lumbar stenosis with neurogenic claudication 04/24/2017   Mixed  hyperlipidemia    Osteoarthritis    Pneumonia 2016,2017   Primary insomnia    Sleep apnea    Spontaneous ecchymoses    Past Surgical History:  Procedure Laterality Date   ABDOMINAL AORTIC ANEURYSM REPAIR     BACK SURGERY N/A [phone], 2021   Lower back and cervical spine   BACK SURGERY  1984   upper back   CATARACT EXTRACTION Left 2017   CATARACT EXTRACTION Right 2017   CHOLECYSTECTOMY  2018  HERNIA REPAIR  2018   PROSTATECTOMY     ROTATOR CUFF REPAIR Right 2015   ROTATOR CUFF REPAIR Left 2017    Family History  Problem Relation Age of Onset   Heart failure Mother    Diabetes Mother    CAD Mother    Hypertension Mother    Social History   Socioeconomic History   Marital status: Widowed    Spouse name: Rock   Number of children: 2   Years of education: Not on file   Highest education level: Some college, no degree  Occupational History   Occupation: retired    Comment: Patent examiner   Occupation: Retired Pharmacologist  Tobacco Use   Smoking status: Former    Current packs/day: 0.00    Average packs/day: 2.0 packs/day for 50.0 years (100.0 ttl pk-yrs)    Types: Cigarettes    Start date: 12/23/1957    Quit date: 12/24/2007    Years since quitting: 16.6   Smokeless tobacco: Never  Vaping Use   Vaping status: Never Used  Substance and Sexual Activity   Alcohol use: Never   Drug use: Never   Sexual activity: Not Currently  Other Topics Concern   Not on file  Social History Narrative   Not on file   Social Drivers of Health   Financial Resource Strain: Low Risk  (08/03/2024)   Overall Financial Resource Strain (CARDIA)    Difficulty of Paying Living Expenses: Not very hard  Food Insecurity: No Food Insecurity (08/03/2024)   Hunger Vital Sign    Worried About Running Out of Food in the Last Year: Never true    Ran Out of Food in the Last Year: Never true  Transportation Needs: No Transportation Needs (08/03/2024)   PRAPARE -  Administrator, Civil Service (Medical): No    Lack of Transportation (Non-Medical): No  Physical Activity: Unknown (08/03/2024)   Exercise Vital Sign    Days of Exercise per Week: Patient declined    Minutes of Exercise per Session: 30 min  Stress: Stress Concern Present (08/03/2024)   [employer] of Occupational Health - Occupational Stress Questionnaire    Feeling of Stress: To some extent  Social Connections: Moderately Integrated (08/26/2023)   Social Connection and Isolation Panel    Frequency of Communication with Friends and Family: More than three times a week    Frequency of Social Gatherings with Friends and Family: Three times a week    Attends Religious Services: More than 4 times per year    Active Member of Clubs or Organizations: Yes    Attends Banker Meetings: More than 4 times per year    Marital Status: Widowed  Recent Concern: Social Connections - Moderately Isolated (06/11/2023)   Social Connection and Isolation Panel    Frequency of Communication with Friends and Family: Three times a week    Frequency of Social Gatherings with Friends and Family: Three times a week    Attends Religious Services: More than 4 times per year    Active Member of Clubs or Organizations: No    Attends Banker Meetings: Never    Marital Status: Widowed    Objective:  BP 132/70   Pulse 80   Temp 98 F (36.7 C)   Resp (!) 22   Ht 5' 8 (1.727 m)   Wt 163 lb (73.9 kg)   BMI 24.78 kg/m      08/03/2024    7:50 AM  04/26/2024    3:49 PM 12/01/2023    1:27 PM  BP/Weight  Systolic BP 132 112 132  Diastolic BP 70 64 82  Wt. (Lbs) 163  172  BMI 24.78 kg/m2  26.15 kg/m2    Physical Exam Vitals reviewed.  Constitutional:      Appearance: Normal appearance.  Neck:     Vascular: No carotid bruit.  Cardiovascular:     Rate and Rhythm: Normal rate and regular rhythm.     Heart sounds: Normal heart sounds.  Pulmonary:     Effort: Pulmonary  effort is normal.     Breath sounds: Normal breath sounds. No wheezing, rhonchi or rales.     Comments: ON OXYGEN  PNC Abdominal:     General: Bowel sounds are normal.     Palpations: Abdomen is soft.     Tenderness: There is no abdominal tenderness.  Neurological:     Mental Status: He is alert and oriented to person, place, and time.  Psychiatric:        Mood and Affect: Mood normal.        Behavior: Behavior normal.         Lab Results  Component Value Date   WBC 8.9 07/30/2024   HGB 13.5 07/30/2024   HCT 41.8 07/30/2024   PLT 357 07/30/2024   GLUCOSE 77 07/30/2024   CHOL 117 07/30/2024   TRIG 84 07/30/2024   HDL 45 07/30/2024   LDLCALC 55 07/30/2024   ALT 21 07/30/2024   AST 32 07/30/2024   NA 142 07/30/2024   K 4.1 07/30/2024   CL 106 07/30/2024   CREATININE 0.97 07/30/2024   BUN 14 07/30/2024   CO2 20 07/30/2024   TSH 1.220 08/28/2023   HGBA1C 5.6 11/27/2023      Assessment & Plan:  Chronic respiratory failure with hypoxia (HCC) Assessment & Plan: Continue oxygen  at 3 L.   Requires supplemental oxygen . Not using prescribed inhaler or nebulizer. Regular use of Stiolto and rescue inhaler. - Discuss inhaler and nebulizer use with [facility]ologist [facility]. - Take all medications to [facility] appointment for review.   Ataxia Assessment & Plan: Recurrent falls over four months. Possible medication side effects and tripping over oxygen  concentrator cord. Balance significantly off. - Discuss balance issues and potential [facility] with [facility]. - Consider MRI of the brain if balance issues persist. - Ensure use of assistive devices like cane or walker.  Orders: -     TSH -     B12 and Folate Panel  Coronary artery disease of native artery of native heart with stable angina pectoris [facility]) Assessment & Plan: The current medical regimen is effective; continue present plan and medications. Continue plavix, aspirin, crestor , amlodopine and  imdur.  Recommend continue to work on eating healthy diet.  Orders: -     Nitroglycerin ; Place 1 tablet (0.4 mg total) under the tongue every 5 (five) minutes as needed for chest pain.  Dispense: 25 tablet; Refill: 1  Essential hypertension, benign Assessment & Plan: Managed with amlodipine . Blood pressure not too low, ruling out hypotension as cause for balance issues.    Mild recurrent major depression (HCC) Assessment & Plan: Managed with sertraline  150 MG every day.  -Continue current medication.   Mixed hyperlipidemia Assessment & Plan: Well controlled.  No changes to medicines. Continue crestor  40 mg once daily. Continue to work on eating a healthy diet and exercise.  Labs REVIEWED.      Meds ordered  this encounter  Medications   nitroGLYCERIN  (NITROSTAT ) 0.4 MG SL tablet    Sig: Place 1 tablet (0.4 mg total) under the tongue every 5 (five) minutes as needed for chest pain.    Dispense:  25 tablet    Refill:  1    Orders Placed This Encounter  Procedures   TSH   B12 and Folate Panel     Follow-up: Return in about 3 months (around 11/03/2024) for chronic follow up.    An After Visit Summary was printed and given to the patient.  Abigail Free, MD [facility] [phone]

## 2024-08-03 NOTE — Assessment & Plan Note (Signed)
 Managed with amlodipine . Blood pressure not too low, ruling out hypotension as cause for balance issues.

## 2024-08-03 NOTE — Assessment & Plan Note (Addendum)
 Continue oxygen  at 3 L.   Requires supplemental oxygen . Not using prescribed inhaler or nebulizer. Regular use of Stiolto and rescue inhaler. - Discuss inhaler and nebulizer use with [facility]monologist at [facility]. - Take all medications to [facility] appointment for review.

## 2024-11-08 ENCOUNTER — Ambulatory Visit (INDEPENDENT_AMBULATORY_CARE_PROVIDER_SITE_OTHER): Admitting: Family Medicine

## 2024-11-08 ENCOUNTER — Encounter: Payer: Self-pay | Admitting: Family Medicine

## 2024-11-08 VITALS — BP 138/78 | HR 73 | Temp 97.8°F | Ht 68.0 in | Wt 160.0 lb

## 2024-11-08 DIAGNOSIS — Z23 Encounter for immunization: Secondary | ICD-10-CM

## 2024-11-08 DIAGNOSIS — R7303 Prediabetes: Secondary | ICD-10-CM

## 2024-11-08 DIAGNOSIS — F330 Major depressive disorder, recurrent, mild: Secondary | ICD-10-CM

## 2024-11-08 DIAGNOSIS — I10 Essential (primary) hypertension: Secondary | ICD-10-CM

## 2024-11-08 DIAGNOSIS — J9611 Chronic respiratory failure with hypoxia: Secondary | ICD-10-CM | POA: Diagnosis not present

## 2024-11-08 DIAGNOSIS — J410 Simple chronic bronchitis: Secondary | ICD-10-CM

## 2024-11-08 DIAGNOSIS — I25118 Atherosclerotic heart disease of native coronary artery with other forms of angina pectoris: Secondary | ICD-10-CM | POA: Diagnosis not present

## 2024-11-08 DIAGNOSIS — R911 Solitary pulmonary nodule: Secondary | ICD-10-CM | POA: Diagnosis not present

## 2024-11-08 DIAGNOSIS — E782 Mixed hyperlipidemia: Secondary | ICD-10-CM

## 2024-11-08 MED ORDER — SERTRALINE HCL 100 MG PO TABS
150.0000 mg | ORAL_TABLET | Freq: Every day | ORAL | Status: AC
Start: 2024-11-08 — End: ?

## 2024-11-08 NOTE — Assessment & Plan Note (Addendum)
 Chronic respiratory failure with episodes of hypoxia. Oxygen  saturation drops significantly with activity, sometimes reaching the 60s. Currently on 4 liters of oxygen , but advised to increase to 5 liters when saturation drops. No specific guidance from pulmonologist on when to seek hospital care for low oxygen  levels. - Increase oxygen  to 5 liters when saturation drops. - Advised to stop activity and take deep breaths when oxygen  levels drop.

## 2024-11-08 NOTE — Assessment & Plan Note (Addendum)
  Orders:   Comprehensive metabolic panel with GFR

## 2024-11-08 NOTE — Assessment & Plan Note (Addendum)
 Managed with sertraline  150 MG every day.  -Continue current medication.

## 2024-11-08 NOTE — Assessment & Plan Note (Addendum)
-  Recent labs show A1C of 5.5, indicating good control of prediabetes.  -Recommend continue to work on eating healthy diet and exercise.  Orders:   Hemoglobin A1c

## 2024-11-08 NOTE — Progress Notes (Signed)
 Subjective:  Patient ID: William Clay, male    DOB: 1941-11-01  Age: 83 y.o. MRN: [id number]  Chief Complaint  Patient presents with   Medical Management of Chronic Issues    Discussed the use of AI scribe software for clinical note transcription with the patient, who gave verbal consent to proceed.  History of Present Illness William Clay is an 84 year old male who presents for a follow-up regarding his lung condition and scheduled biopsy.  Pulmonary symptoms and imaging findings - Increased cough and difficulty maintaining adequate oxygenation. - Currently on 4 liters of supplemental oxygen ; oxygen  saturation has dropped to the 70s, with prolonged recovery to 90%. - During outdoor activity, oxygen  saturation dropped to the 60s despite supplemental oxygen , unable to raise above 83% for over 30 minutes. - CT scan on October 2nd revealed two right lower lobe abnormalities. - PET scan on October 23rd showed right lower lobe consolidation. - Scheduled for bronchoscopy on November 18th, moved up from December 2nd. - Conflicting opinions from physicians regarding diagnosis of lung cancer.  Cognitive changes and fall - Recent fall. - Memory impairment, attributed to hypoxemia. - Lives alone and utilizes a life alert system.  Right flank pain - Persistent right flank pain for several days. - Pain is not similar to previous episodes of nephrolithiasis. - No associated rash or hematuria. - No medication taken for pain due to polypharmacy concerns.  Upper respiratory symptoms - Occasional earaches and sore throat. - No fever, chills, or sweats.  Medication and treatment adherence - Current medications include amlodipine , losartan , Crestor , Plavix, aspirin, Protonix , sertraline , mometasone  inhaler, and Stiolto inhaler. - Discontinued use of herbal medications and some prescriptions due to polypharmacy concerns.  Laboratory studies - Blood work performed on November 3rd;  results not yet communicated.   HTN: Norvasc  5 mg daily, Asa 81 and Plavix, Losartan  100 mg daily   Hyperlipidemia: Crestor  40 mg daily  CORONARY ARTERY DISEASE: ON PLAVIX, ASA, CRESTOR .    GERD: Protonix  40 mg daily    Anxiety/Depression: Sertraline  150 mg daily.    Prediabetes: A1c 5.7   OSA with cpap and O2 4 L   Chronic respiratory failure with hypoxia: uses 4 L oxygen  almost 24 hours per day.       11/08/2024    1:55 PM 08/03/2024    8:02 AM 12/01/2023    1:26 PM 08/28/2023    2:03 PM 06/11/2023    3:43 PM  Depression screen PHQ 2/9  Decreased Interest 0 0 0 0 1  Down, Depressed, Hopeless 1 0 0 0 0  PHQ - 2 Score 1 0 0 0 1  Altered sleeping 0  0  0  Tired, decreased energy 2  0  0  Change in appetite 0  0  0  Feeling bad or failure about yourself  1  0  0  Trouble concentrating 0  0  0  Moving slowly or fidgety/restless 0  0  0  Suicidal thoughts 0  0  0  PHQ-9 Score 4  0   1   Difficult doing work/chores Not difficult at all  Not difficult at all  Not difficult at all     Data saved with a previous flowsheet row definition        08/03/2024    8:35 AM  Fall Risk   Falls in the past year? 1  Number falls in past yr: 1  Injury with Fall? 1  Risk for  fall due to : Impaired balance/gait;History of fall(s)  Follow up Falls evaluation completed;Education provided;Falls prevention discussed    Patient Care Team: Sherre Clapper, MD as PCP - General (Family Medicine) Evern Donnajean Kayser, MD as Referring Physician (Orthopedic Surgery) Nyle Rankin POUR, [facility] (Inactive) (Pharmacist)   Review of Systems  Constitutional:  Negative for chills, fatigue and fever.  HENT:  Negative for congestion, ear pain and sore throat.   Respiratory:  Positive for shortness of breath. Negative for cough.   Cardiovascular:  Negative for chest pain.  Gastrointestinal:  Negative for abdominal pain, constipation, diarrhea, nausea and vomiting.  Endocrine: Negative for polydipsia,  polyphagia and polyuria.  Genitourinary:  Negative for dysuria and frequency.  Musculoskeletal:  Negative for arthralgias and myalgias.  Neurological:  Negative for dizziness and headaches.  Psychiatric/Behavioral:  Negative for dysphoric mood.        No dysphoria    Current Outpatient Medications on File Prior to Visit  Medication Sig Dispense Refill   albuterol  (PROVENTIL  HFA;VENTOLIN  HFA) 108 (90 Base) MCG/ACT inhaler Inhale two puffs every four to six hours as needed for cough or wheeze. 1 Inhaler 1   amLODipine  (NORVASC ) 5 MG tablet Take 1 tablet by mouth daily.     aspirin EC 81 MG tablet Take 81 mg by mouth daily. Swallow whole.     Carboxymethylcellulose Sod PF 1 % GEL Apply 1 drop to eye.     clopidogrel (PLAVIX) 75 MG tablet Take 1 tablet by mouth daily.     fluticasone (FLONASE) 50 MCG/ACT nasal spray Place 2 sprays into both nostrils daily.     ipratropium (ATROVENT) 0.03 % nasal spray Place into the nose.     loratadine (CLARITIN) 10 MG tablet Take 10 mg by mouth daily.     Mometasone  Furoate 200 MCG/ACT AERO Inhale 2 puffs into the lungs at bedtime.     Multiple Vitamins-Minerals (MULTIVITAMIN WITH MINERALS) tablet Take 1 tablet by mouth daily.     nitroGLYCERIN  (NITROSTAT ) 0.4 MG SL tablet Place 1 tablet (0.4 mg total) under the tongue every 5 (five) minutes as needed for chest pain. 25 tablet 1   pantoprazole  (PROTONIX ) 40 MG tablet Take 1 tablet (40 mg total) by mouth 2 (two) times daily before a meal. 180 tablet 3   rosuvastatin  (CRESTOR ) 40 MG tablet Take 1 tablet (40 mg total) by mouth at bedtime. 90 tablet 1   Tiotropium Bromide-Olodaterol 2.5-2.5 MCG/ACT AERS Inhale 2 puffs into the lungs daily.      lactulose, encephalopathy, (CHRONULAC) 10 GM/15ML SOLN Take 30 g by mouth 2 (two) times daily. (Patient not taking: Reported on 11/08/2024)     No current facility-administered medications on file prior to visit.   Past Medical History:  Diagnosis Date    Atherosclerosis of aorta    Bowel obstruction (HCC) 2018   BPH (benign prostatic hyperplasia)    COPD (chronic obstructive pulmonary disease) (HCC)    COPD (chronic obstructive pulmonary disease) (HCC)    GERD (gastroesophageal reflux disease)    Hypertension    Hypoxemia    Lumbar stenosis with neurogenic claudication 04/24/2017   Mixed hyperlipidemia    Osteoarthritis    Pneumonia 2016,2017   Primary insomnia    Sleep apnea    Spontaneous ecchymoses    Past Surgical History:  Procedure Laterality Date   ABDOMINAL AORTIC ANEURYSM REPAIR     BACK SURGERY N/A [phone], 2021   Lower back and cervical spine   BACK SURGERY  1984  upper back   CATARACT EXTRACTION Left 2017   CATARACT EXTRACTION Right 2017   CHOLECYSTECTOMY  2018   HERNIA REPAIR  2018   PROSTATECTOMY     ROTATOR CUFF REPAIR Right 2015   ROTATOR CUFF REPAIR Left 2017    Family History  Problem Relation Age of Onset   Heart failure Mother    Diabetes Mother    CAD Mother    Hypertension Mother    Social History   Socioeconomic History   Marital status: Widowed    Spouse name: Rock   Number of children: 2   Years of education: Not on file   Highest education level: Some college, no degree  Occupational History   Occupation: retired    Comment: Patent Examiner   Occupation: Retired Pharmacologist  Tobacco Use   Smoking status: Former    Current packs/day: 0.00    Average packs/day: 2.0 packs/day for 50.0 years (100.0 ttl pk-yrs)    Types: Cigarettes    Start date: 12/23/1957    Quit date: 12/24/2007    Years since quitting: 16.9   Smokeless tobacco: Never  Vaping Use   Vaping status: Never Used  Substance and Sexual Activity   Alcohol use: Never   Drug use: Never   Sexual activity: Not Currently  Other Topics Concern   Not on file  Social History Narrative   Not on file   Social Drivers of Health   Financial Resource Strain: Low Risk  (08/03/2024)   Overall  Financial Resource Strain (CARDIA)    Difficulty of Paying Living Expenses: Not very hard  Food Insecurity: No Food Insecurity (08/03/2024)   Hunger Vital Sign    Worried About Running Out of Food in the Last Year: Never true    Ran Out of Food in the Last Year: Never true  Transportation Needs: No Transportation Needs (08/03/2024)   PRAPARE - Administrator, Civil Service (Medical): No    Lack of Transportation (Non-Medical): No  Physical Activity: Unknown (08/03/2024)   Exercise Vital Sign    Days of Exercise per Week: Patient declined    Minutes of Exercise per Session: 30 min  Stress: Stress Concern Present (08/03/2024)   [employer] of Occupational Health - Occupational Stress Questionnaire    Feeling of Stress: To some extent  Social Connections: Moderately Integrated (08/26/2023)   Social Connection and Isolation Panel    Frequency of Communication with Friends and Family: More than three times a week    Frequency of Social Gatherings with Friends and Family: Three times a week    Attends Religious Services: More than 4 times per year    Active Member of Clubs or Organizations: Yes    Attends Banker Meetings: More than 4 times per year    Marital Status: Widowed  Recent Concern: Social Connections - Moderately Isolated (06/11/2023)   Social Connection and Isolation Panel    Frequency of Communication with Friends and Family: Three times a week    Frequency of Social Gatherings with Friends and Family: Three times a week    Attends Religious Services: More than 4 times per year    Active Member of Clubs or Organizations: No    Attends Banker Meetings: Never    Marital Status: Widowed    Objective:  BP 138/78 (BP Location: Left Arm, Patient Position: Sitting)   Pulse 73   Temp 97.8 F (36.6 C) (Temporal)   Ht 5' 8 (1.727  m)   Wt 160 lb (72.6 kg)   SpO2 90%   BMI 24.33 kg/m      11/08/2024    1:58 PM 08/03/2024    7:50 AM  04/26/2024    3:49 PM  BP/Weight  Systolic BP 138 132 112  Diastolic BP 78 70 64  Wt. (Lbs) 160 163   BMI 24.33 kg/m2 24.78 kg/m2     Physical Exam Vitals reviewed.  Constitutional:      Appearance: Normal appearance. He is normal weight.     Comments: On scooter  Neck:     Vascular: No carotid bruit.  Cardiovascular:     Rate and Rhythm: Normal rate and regular rhythm.     Heart sounds: Normal heart sounds.     Comments: oxygen  Pulmonary:     Effort: Pulmonary effort is normal.     Breath sounds: Normal breath sounds. No wheezing, rhonchi or rales.  Abdominal:     General: Bowel sounds are normal.     Palpations: Abdomen is soft.     Tenderness: There is no abdominal tenderness.  Neurological:     Mental Status: He is alert and oriented to person, place, and time.  Psychiatric:        Mood and Affect: Mood normal.        Behavior: Behavior normal.      Diabetic foot exam was performed with the following findings:   No deformities, ulcerations, or other skin breakdown Normal sensation of 10g monofilament Intact posterior tibialis and dorsalis pedis pulses      Lab Results  Component Value Date   WBC 8.9 07/30/2024   HGB 13.5 07/30/2024   HCT 41.8 07/30/2024   PLT 357 07/30/2024   GLUCOSE 77 11/08/2024   CHOL 117 07/30/2024   TRIG 84 07/30/2024   HDL 45 07/30/2024   LDLCALC 55 07/30/2024   ALT 22 11/08/2024   AST 30 11/08/2024   NA 143 11/08/2024   K 4.0 11/08/2024   CL 107 (H) 11/08/2024   CREATININE 0.76 11/08/2024   BUN 16 11/08/2024   CO2 20 11/08/2024   TSH 1.220 08/28/2023   HGBA1C 5.5 11/08/2024    Results for orders placed or performed in visit on 11/08/24  Comprehensive metabolic panel with GFR   Collection Time: 11/08/24  2:48 PM  Result Value Ref Range   Glucose 77 70 - 99 mg/dL   BUN 16 8 - 27 mg/dL   Creatinine, Ser 9.23 0.76 - 1.27 mg/dL   eGFR 90 >40 fO/fpw/8.26   BUN/Creatinine Ratio 21 10 - 24   Sodium 143 134 - 144 mmol/L    Potassium 4.0 3.5 - 5.2 mmol/L   Chloride 107 (H) 96 - 106 mmol/L   CO2 20 20 - 29 mmol/L   Calcium  9.7 8.6 - 10.2 mg/dL   Total Protein 6.9 6.0 - 8.5 g/dL   Albumin 4.4 3.7 - 4.7 g/dL   Globulin, Total 2.5 1.5 - 4.5 g/dL   Bilirubin Total 0.6 0.0 - 1.2 mg/dL   Alkaline Phosphatase 98 48 - 129 IU/L   AST 30 0 - 40 IU/L   ALT 22 0 - 44 IU/L  Hemoglobin A1c   Collection Time: 11/08/24  2:48 PM  Result Value Ref Range   Hgb A1c MFr Bld 5.5 4.8 - 5.6 %   Est. average glucose Bld gHb Est-mCnc 111 mg/dL  .  Assessment & Plan:   Assessment & Plan Essential hypertension, benign  Orders:  Comprehensive metabolic panel with GFR  Chronic respiratory failure with hypoxia (HCC) Chronic respiratory failure with episodes of hypoxia. Oxygen  saturation drops significantly with activity, sometimes reaching the 60s. Currently on 4 liters of oxygen , but advised to increase to 5 liters when saturation drops. No specific guidance from pulmonologist on when to seek hospital care for low oxygen  levels. - Increase oxygen  to 5 liters when saturation drops. - Advised to stop activity and take deep breaths when oxygen  levels drop.    Mixed hyperlipidemia Well controlled.  No changes to medicines. Continue crestor  40 mg once daily. Continue to work on eating a healthy diet and exercise.  Labs REVIEWED.     Coronary artery disease of native artery of native heart with stable angina pectoris The current medical regimen is effective; continue present plan and medications. Continue plavix, aspirin, crestor , amlodopine and imdur.  Recommend continue to work on eating healthy diet.    Mild recurrent major depression Managed with sertraline  150 MG every day.  -Continue current medication.    Prediabetes -Recent labs show A1C of 5.5, indicating good control of prediabetes.  -Recommend continue to work on eating healthy diet and exercise.  Orders:   Hemoglobin A1c   Encounter for  immunization  Orders:   Flu vaccine HIGH DOSE PF(Fluzone Trivalent)  Encounter for immunization  Orders:   Pfizer Comirnaty Covid-19 Vaccine 75yrs & older  Apical lung nodule Suspected right lower lobe lung cancer CT scan on October 2nd showed two right lower lobe abnormalities concerning for lung cancer. PET scan on October 23rd showed consolidation, possibly reflecting infection or inflammation. Scheduled for bronchoscopy on November 18th, 2025, to confirm diagnosis. - Proceed with scheduled bronchoscopy on November 18th, 2025.    Simple chronic bronchitis (HCC) Hypoxia is worsening. Patient is not having exacerbation.  It is likely progression of copd and possibly lung cancer.  Continue stiolto and asmanex , albuterol  for as needed use.       Body mass index is 24.33 kg/m.    Meds ordered this encounter  Medications   sertraline  (ZOLOFT ) 100 MG tablet    Sig: Take 1.5 tablets (150 mg total) by mouth daily.    Orders Placed This Encounter  Procedures   Flu vaccine HIGH DOSE PF(Fluzone Trivalent)   Pfizer Comirnaty Covid-19 Vaccine 56yrs & older   Comprehensive metabolic panel with GFR   Hemoglobin A1c       Follow-up: Return for awv before the end of the year.  4 month follow up with me. .  An After Visit Summary was printed and given to the patient.  Abigail Free, MD [facility] [phone]

## 2024-11-08 NOTE — Assessment & Plan Note (Addendum)
 Well controlled.  No changes to medicines. Continue crestor  40 mg once daily. Continue to work on eating a healthy diet and exercise.  Labs REVIEWED.

## 2024-11-08 NOTE — Assessment & Plan Note (Addendum)
 The current medical regimen is effective; continue present plan and medications. Continue plavix, aspirin, crestor , amlodopine and imdur.  Recommend continue to work on eating healthy diet.

## 2024-11-09 ENCOUNTER — Ambulatory Visit: Payer: Self-pay | Admitting: Family Medicine

## 2024-11-09 LAB — HEMOGLOBIN A1C
Est. average glucose Bld gHb Est-mCnc: 111 mg/dL
Hgb A1c MFr Bld: 5.5 % (ref 4.8–5.6)

## 2024-11-09 LAB — COMPREHENSIVE METABOLIC PANEL WITH GFR
ALT: 22 IU/L (ref 0–44)
AST: 30 IU/L (ref 0–40)
Albumin: 4.4 g/dL (ref 3.7–4.7)
Alkaline Phosphatase: 98 IU/L (ref 48–129)
BUN/Creatinine Ratio: 21 (ref 10–24)
BUN: 16 mg/dL (ref 8–27)
Bilirubin Total: 0.6 mg/dL (ref 0.0–1.2)
CO2: 20 mmol/L (ref 20–29)
Calcium: 9.7 mg/dL (ref 8.6–10.2)
Chloride: 107 mmol/L — ABNORMAL HIGH (ref 96–106)
Creatinine, Ser: 0.76 mg/dL (ref 0.76–1.27)
Globulin, Total: 2.5 g/dL (ref 1.5–4.5)
Glucose: 77 mg/dL (ref 70–99)
Potassium: 4 mmol/L (ref 3.5–5.2)
Sodium: 143 mmol/L (ref 134–144)
Total Protein: 6.9 g/dL (ref 6.0–8.5)
eGFR: 90 mL/min/1.73 (ref >59)

## 2024-11-13 DIAGNOSIS — R911 Solitary pulmonary nodule: Secondary | ICD-10-CM | POA: Insufficient documentation

## 2024-11-13 NOTE — Assessment & Plan Note (Signed)
 Hypoxia is worsening. Patient is not having exacerbation.  It is likely progression of copd and possibly lung cancer.  Continue stiolto and asmanex , albuterol  for as needed use.

## 2024-11-13 NOTE — Assessment & Plan Note (Signed)
 Suspected right lower lobe lung cancer CT scan on October 2nd showed two right lower lobe abnormalities concerning for lung cancer. PET scan on October 23rd showed consolidation, possibly reflecting infection or inflammation. Scheduled for bronchoscopy on November 18th, 2025, to confirm diagnosis. - Proceed with scheduled bronchoscopy on November 18th, 2025.

## 2024-12-10 ENCOUNTER — Ambulatory Visit: Admitting: Family Medicine

## 2024-12-10 ENCOUNTER — Encounter: Payer: Self-pay | Admitting: Family Medicine

## 2024-12-10 VITALS — BP 128/82

## 2024-12-10 DIAGNOSIS — Z0000 Encounter for general adult medical examination without abnormal findings: Secondary | ICD-10-CM | POA: Diagnosis not present
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
## 2024-12-10 NOTE — Patient Instructions (Signed)
 Preventive Care 73 Years and Older, Male Preventive care refers to lifestyle choices and visits with your health care provider that can promote health and wellness. Preventive care visits are also called wellness exams. What can I expect for my preventive care visit? Counseling During your preventive care visit, your health care provider may ask about your: Medical history, including: Past medical problems. Family medical history. History of falls. Current health, including: Emotional well-being. Home life and relationship well-being. Sexual activity. Memory and ability to understand (cognition). Lifestyle, including: Alcohol, nicotine or tobacco, and drug use. Access to firearms. Diet, exercise, and sleep habits. Work and work Astronomer. Sunscreen use. Safety issues such as seatbelt and bike helmet use. Physical exam Your health care provider will check your: Height and weight. These may be used to calculate your BMI (body mass index). BMI is a measurement that tells if you are at a healthy weight. Waist circumference. This measures the distance around your waistline. This measurement also tells if you are at a healthy weight and may help predict your risk of certain diseases, such as type 2 diabetes and high blood pressure. Heart rate and blood pressure. Body temperature. Skin for abnormal spots. What immunizations do I need?  Vaccines are usually given at various ages, according to a schedule. Your health care provider will recommend vaccines for you based on your age, medical history, and lifestyle or other factors, such as travel or where you work. What tests do I need? Screening Your health care provider may recommend screening tests for certain conditions. This may include: Lipid and cholesterol levels. Diabetes screening. This is done by checking your blood sugar (glucose) after you have not eaten for a while (fasting). Hepatitis C test. Hepatitis B test. HIV (human  immunodeficiency virus) test. STI (sexually transmitted infection) testing, if you are at risk. Lung cancer screening. Colorectal cancer screening. Prostate cancer screening. Abdominal aortic aneurysm (AAA) screening. You may need this if you are a current or former smoker. Talk with your health care provider about your test results, treatment options, and if necessary, the need for more tests. Follow these instructions at home: Eating and drinking  Eat a diet that includes fresh fruits and vegetables, whole grains, lean protein, and low-fat dairy products. Limit your intake of foods with high amounts of sugar, saturated fats, and salt. Take vitamin and mineral supplements as recommended by your health care provider. Do not drink alcohol if your health care provider tells you not to drink. If you drink alcohol: Limit how much you have to 0-2 drinks a day. Know how much alcohol is in your drink. In the [place] one drink equals one 12 oz bottle of beer (355 mL), one 5 oz glass of wine (148 mL), or one 1 oz glass of hard liquor (44 mL). Lifestyle Brush your teeth every morning and night with fluoride toothpaste. Floss one time each day. Exercise for at least 30 minutes 5 or more days each week. Do not use any products that contain nicotine or tobacco. These products include cigarettes, chewing tobacco, and vaping devices, such as e-cigarettes. If you need help quitting, ask your health care provider. Do not use drugs. If you are sexually active, practice safe sex. Use a condom or other form of protection to prevent STIs. Take aspirin only as told by your health care provider. Make sure that you understand how much to take and what form to take. Work with your health care provider to find out whether it is safe  and beneficial for you to take aspirin daily. Ask your health care provider if you need to take a cholesterol-lowering medicine (statin). Find healthy ways to manage stress, such  as: Meditation, yoga, or listening to music. Journaling. Talking to a trusted person. Spending time with friends and family. Safety Always wear your seat belt while driving or riding in a vehicle. Do not drive: If you have been drinking alcohol. Do not ride with someone who has been drinking. When you are tired or distracted. While texting. If you have been using any mind-altering substances or drugs. Wear a helmet and other protective equipment during sports activities. If you have firearms in your house, make sure you follow all gun safety procedures. Minimize exposure to UV radiation to reduce your risk of skin cancer. What's next? Visit your health care provider once a year for an annual wellness visit. Ask your health care provider how often you should have your eyes and teeth checked. Stay up to date on all vaccines. This information is not intended to replace advice given to you by your health care provider. Make sure you discuss any questions you have with your health care provider. Document Revised: 06/06/2021 Document Reviewed: 06/06/2021 Elsevier Patient Education  2024 [employer].

## 2024-12-10 NOTE — Assessment & Plan Note (Signed)
 Up to date on vaccines and screenings  Things to do to keep yourself healthy  - Exercise at least 30-45 minutes a day, 3-4 days a week.  - Eat a low-fat diet with lots of fruits and vegetables, up to 7-9 servings per day.  - Seatbelts can save your life. Wear them always.  - Smoke detectors on every level of your home, check batteries every year.  - Eye Doctor - have an eye exam every 1-2 years  - Alcohol -  If you drink, do it moderately, less than 2 drinks per day.  - Health Care Power of Attorney. Choose someone to speak for you if you are not able.  - Depression is common in our stressful world.If you're feeling down or losing interest in things you normally enjoy, please come in for a visit.  - Violence - If anyone is threatening or hurting you, please call immediately.

## 2025-03-09 ENCOUNTER — Ambulatory Visit: Admitting: Family Medicine
